# Patient Record
Sex: Male | Born: 1950 | Hispanic: Refuse to answer | Smoking: Former smoker
Health system: Southern US, Community
[De-identification: ages and names within clinical notes are randomized; demographics above are authoritative.]

## PROBLEM LIST (undated history)

## (undated) DIAGNOSIS — M6281 Muscle weakness (generalized): Secondary | ICD-10-CM

## (undated) DIAGNOSIS — M009 Pyogenic arthritis, unspecified: Secondary | ICD-10-CM

## (undated) DIAGNOSIS — F1910 Other psychoactive substance abuse, uncomplicated: Secondary | ICD-10-CM

## (undated) DIAGNOSIS — G9340 Encephalopathy, unspecified: Secondary | ICD-10-CM

## (undated) DIAGNOSIS — L97919 Non-pressure chronic ulcer of unspecified part of right lower leg with unspecified severity: Secondary | ICD-10-CM

## (undated) DIAGNOSIS — A411 Sepsis due to other specified staphylococcus: Secondary | ICD-10-CM

## (undated) DIAGNOSIS — R7881 Bacteremia: Secondary | ICD-10-CM

## (undated) DIAGNOSIS — S2232XA Fracture of one rib, left side, initial encounter for closed fracture: Secondary | ICD-10-CM

## (undated) DIAGNOSIS — L97929 Non-pressure chronic ulcer of unspecified part of left lower leg with unspecified severity: Secondary | ICD-10-CM

## (undated) DIAGNOSIS — F1010 Alcohol abuse, uncomplicated: Secondary | ICD-10-CM

## (undated) DIAGNOSIS — A498 Other bacterial infections of unspecified site: Secondary | ICD-10-CM

## (undated) DIAGNOSIS — IMO0002 Reserved for concepts with insufficient information to code with codable children: Secondary | ICD-10-CM

## (undated) DIAGNOSIS — K089 Disorder of teeth and supporting structures, unspecified: Secondary | ICD-10-CM

## (undated) DIAGNOSIS — N289 Disorder of kidney and ureter, unspecified: Secondary | ICD-10-CM

## (undated) DIAGNOSIS — R4182 Altered mental status, unspecified: Secondary | ICD-10-CM

## (undated) DIAGNOSIS — D649 Anemia, unspecified: Secondary | ICD-10-CM

---

## 2004-04-14 ENCOUNTER — Emergency Department (HOSPITAL_COMMUNITY): Admission: EM | Admit: 2004-04-14 | Discharge: 2004-04-14 | Payer: Self-pay | Admitting: Emergency Medicine

## 2004-04-29 ENCOUNTER — Emergency Department (HOSPITAL_COMMUNITY): Admission: EM | Admit: 2004-04-29 | Discharge: 2004-04-29 | Payer: Self-pay | Admitting: Emergency Medicine

## 2005-01-08 ENCOUNTER — Emergency Department (HOSPITAL_COMMUNITY): Admission: EM | Admit: 2005-01-08 | Discharge: 2005-01-08 | Payer: Self-pay | Admitting: Family Medicine

## 2009-06-17 ENCOUNTER — Emergency Department (HOSPITAL_BASED_OUTPATIENT_CLINIC_OR_DEPARTMENT_OTHER): Admission: EM | Admit: 2009-06-17 | Discharge: 2009-06-17 | Payer: Self-pay | Admitting: Emergency Medicine

## 2009-06-20 ENCOUNTER — Emergency Department (HOSPITAL_COMMUNITY): Admission: EM | Admit: 2009-06-20 | Discharge: 2009-06-20 | Payer: Self-pay | Admitting: Emergency Medicine

## 2009-10-16 ENCOUNTER — Emergency Department (HOSPITAL_COMMUNITY): Admission: EM | Admit: 2009-10-16 | Discharge: 2009-10-16 | Payer: Self-pay | Admitting: Emergency Medicine

## 2010-07-10 LAB — COMPREHENSIVE METABOLIC PANEL
Alkaline Phosphatase: 88 U/L (ref 39–117)
BUN: 13 mg/dL (ref 6–23)
Calcium: 8.9 mg/dL (ref 8.4–10.5)
Creatinine, Ser: 1 mg/dL (ref 0.4–1.5)
Glucose, Bld: 76 mg/dL (ref 70–99)
Total Protein: 7.5 g/dL (ref 6.0–8.3)

## 2010-07-10 LAB — URINALYSIS, ROUTINE W REFLEX MICROSCOPIC
Hgb urine dipstick: NEGATIVE
Specific Gravity, Urine: 1.022 (ref 1.005–1.030)
Urobilinogen, UA: 0.2 mg/dL (ref 0.0–1.0)

## 2010-07-10 LAB — DIFFERENTIAL
Basophils Relative: 2 % — ABNORMAL HIGH (ref 0–1)
Eosinophils Absolute: 0.2 10*3/uL (ref 0.0–0.7)
Eosinophils Relative: 4 % (ref 0–5)
Lymphocytes Relative: 29 % (ref 12–46)
Lymphs Abs: 1.4 10*3/uL (ref 0.7–4.0)
Lymphs Abs: 1.8 10*3/uL (ref 0.7–4.0)
Monocytes Absolute: 0.4 10*3/uL (ref 0.1–1.0)
Monocytes Relative: 8 % (ref 3–12)
Monocytes Relative: 8 % (ref 3–12)
Neutro Abs: 2.6 10*3/uL (ref 1.7–7.7)
Neutrophils Relative %: 58 % (ref 43–77)

## 2010-07-10 LAB — CBC
HCT: 42.8 % (ref 39.0–52.0)
HCT: 44.5 % (ref 39.0–52.0)
Hemoglobin: 14.5 g/dL (ref 13.0–17.0)
MCHC: 33.9 g/dL (ref 30.0–36.0)
MCV: 89.6 fL (ref 78.0–100.0)
MCV: 90.2 fL (ref 78.0–100.0)
RBC: 4.94 MIL/uL (ref 4.22–5.81)
RDW: 13.6 % (ref 11.5–15.5)
WBC: 5.1 10*3/uL (ref 4.0–10.5)

## 2010-07-10 LAB — POCT TOXICOLOGY PANEL

## 2010-07-10 LAB — BASIC METABOLIC PANEL
CO2: 27 mEq/L (ref 19–32)
Chloride: 104 mEq/L (ref 96–112)
GFR calc Af Amer: >60 mL/min (ref >60)
Sodium: 138 mEq/L (ref 135–145)

## 2014-10-26 ENCOUNTER — Inpatient Hospital Stay (HOSPITAL_COMMUNITY): Payer: Medicare Other | Admitting: Certified Registered"

## 2014-10-26 ENCOUNTER — Encounter (HOSPITAL_COMMUNITY)
Admission: EM | Disposition: A | Discharge status: Skilled Nursing Facility | Payer: Self-pay | Source: Home / Self Care | Attending: Family Medicine

## 2014-10-26 ENCOUNTER — Encounter (HOSPITAL_COMMUNITY): Payer: Self-pay | Admitting: *Deleted

## 2014-10-26 ENCOUNTER — Emergency Department (HOSPITAL_COMMUNITY): Payer: Medicare Other

## 2014-10-26 ENCOUNTER — Inpatient Hospital Stay (HOSPITAL_COMMUNITY)
Admission: EM | Admit: 2014-10-26 | Discharge: 2014-11-05 | DRG: 579 | Disposition: A | Discharge status: Skilled Nursing Facility | Payer: Medicare Other | Attending: Family Medicine | Admitting: Family Medicine

## 2014-10-26 DIAGNOSIS — T1490 Injury, unspecified: Secondary | ICD-10-CM

## 2014-10-26 DIAGNOSIS — S71101D Unspecified open wound, right thigh, subsequent encounter: Secondary | ICD-10-CM | POA: Diagnosis not present

## 2014-10-26 DIAGNOSIS — K088 Other specified disorders of teeth and supporting structures: Secondary | ICD-10-CM | POA: Diagnosis present

## 2014-10-26 DIAGNOSIS — M79652 Pain in left thigh: Secondary | ICD-10-CM | POA: Diagnosis present

## 2014-10-26 DIAGNOSIS — I340 Nonrheumatic mitral (valve) insufficiency: Secondary | ICD-10-CM | POA: Diagnosis not present

## 2014-10-26 DIAGNOSIS — E46 Unspecified protein-calorie malnutrition: Secondary | ICD-10-CM | POA: Diagnosis present

## 2014-10-26 DIAGNOSIS — G9389 Other specified disorders of brain: Secondary | ICD-10-CM | POA: Diagnosis present

## 2014-10-26 DIAGNOSIS — L03116 Cellulitis of left lower limb: Secondary | ICD-10-CM | POA: Diagnosis present

## 2014-10-26 DIAGNOSIS — N289 Disorder of kidney and ureter, unspecified: Secondary | ICD-10-CM | POA: Diagnosis not present

## 2014-10-26 DIAGNOSIS — F1010 Alcohol abuse, uncomplicated: Secondary | ICD-10-CM | POA: Diagnosis present

## 2014-10-26 DIAGNOSIS — IMO0002 Reserved for concepts with insufficient information to code with codable children: Secondary | ICD-10-CM

## 2014-10-26 DIAGNOSIS — B965 Pseudomonas (aeruginosa) (mallei) (pseudomallei) as the cause of diseases classified elsewhere: Secondary | ICD-10-CM | POA: Diagnosis not present

## 2014-10-26 DIAGNOSIS — T07XXXA Unspecified multiple injuries, initial encounter: Secondary | ICD-10-CM

## 2014-10-26 DIAGNOSIS — D649 Anemia, unspecified: Secondary | ICD-10-CM | POA: Diagnosis present

## 2014-10-26 DIAGNOSIS — IMO0001 Reserved for inherently not codable concepts without codable children: Secondary | ICD-10-CM

## 2014-10-26 DIAGNOSIS — M009 Pyogenic arthritis, unspecified: Secondary | ICD-10-CM | POA: Diagnosis present

## 2014-10-26 DIAGNOSIS — M00062 Staphylococcal arthritis, left knee: Secondary | ICD-10-CM | POA: Diagnosis not present

## 2014-10-26 DIAGNOSIS — S71102D Unspecified open wound, left thigh, subsequent encounter: Secondary | ICD-10-CM | POA: Diagnosis not present

## 2014-10-26 DIAGNOSIS — R410 Disorientation, unspecified: Secondary | ICD-10-CM | POA: Diagnosis not present

## 2014-10-26 DIAGNOSIS — B871 Wound myiasis: Secondary | ICD-10-CM | POA: Diagnosis present

## 2014-10-26 DIAGNOSIS — S2232XA Fracture of one rib, left side, initial encounter for closed fracture: Secondary | ICD-10-CM | POA: Diagnosis present

## 2014-10-26 DIAGNOSIS — R7881 Bacteremia: Secondary | ICD-10-CM | POA: Diagnosis present

## 2014-10-26 DIAGNOSIS — L97113 Non-pressure chronic ulcer of right thigh with necrosis of muscle: Secondary | ICD-10-CM | POA: Diagnosis present

## 2014-10-26 DIAGNOSIS — T814XXA Infection following a procedure, initial encounter: Secondary | ICD-10-CM

## 2014-10-26 DIAGNOSIS — G9340 Encephalopathy, unspecified: Secondary | ICD-10-CM | POA: Diagnosis present

## 2014-10-26 DIAGNOSIS — D72829 Elevated white blood cell count, unspecified: Secondary | ICD-10-CM | POA: Diagnosis present

## 2014-10-26 DIAGNOSIS — K089 Disorder of teeth and supporting structures, unspecified: Secondary | ICD-10-CM | POA: Diagnosis present

## 2014-10-26 DIAGNOSIS — W19XXXA Unspecified fall, initial encounter: Secondary | ICD-10-CM | POA: Diagnosis present

## 2014-10-26 DIAGNOSIS — F17210 Nicotine dependence, cigarettes, uncomplicated: Secondary | ICD-10-CM | POA: Diagnosis present

## 2014-10-26 DIAGNOSIS — L03115 Cellulitis of right lower limb: Secondary | ICD-10-CM | POA: Diagnosis present

## 2014-10-26 DIAGNOSIS — B9561 Methicillin susceptible Staphylococcus aureus infection as the cause of diseases classified elsewhere: Secondary | ICD-10-CM | POA: Diagnosis present

## 2014-10-26 DIAGNOSIS — L97123 Non-pressure chronic ulcer of left thigh with necrosis of muscle: Secondary | ICD-10-CM | POA: Diagnosis present

## 2014-10-26 DIAGNOSIS — Z590 Homelessness: Secondary | ICD-10-CM

## 2014-10-26 DIAGNOSIS — M00061 Staphylococcal arthritis, right knee: Secondary | ICD-10-CM | POA: Diagnosis not present

## 2014-10-26 DIAGNOSIS — B879 Myiasis, unspecified: Secondary | ICD-10-CM | POA: Diagnosis present

## 2014-10-26 DIAGNOSIS — D62 Acute posthemorrhagic anemia: Secondary | ICD-10-CM | POA: Diagnosis not present

## 2014-10-26 DIAGNOSIS — S81001D Unspecified open wound, right knee, subsequent encounter: Secondary | ICD-10-CM | POA: Diagnosis not present

## 2014-10-26 DIAGNOSIS — R4182 Altered mental status, unspecified: Secondary | ICD-10-CM | POA: Diagnosis present

## 2014-10-26 DIAGNOSIS — L0889 Other specified local infections of the skin and subcutaneous tissue: Secondary | ICD-10-CM | POA: Diagnosis not present

## 2014-10-26 DIAGNOSIS — T814XXD Infection following a procedure, subsequent encounter: Secondary | ICD-10-CM

## 2014-10-26 DIAGNOSIS — A498 Other bacterial infections of unspecified site: Secondary | ICD-10-CM | POA: Diagnosis present

## 2014-10-26 DIAGNOSIS — F1910 Other psychoactive substance abuse, uncomplicated: Secondary | ICD-10-CM | POA: Diagnosis present

## 2014-10-26 DIAGNOSIS — S81002D Unspecified open wound, left knee, subsequent encounter: Secondary | ICD-10-CM | POA: Diagnosis not present

## 2014-10-26 HISTORY — PX: INFECTED SKIN DEBRIDEMENT: SHX678

## 2014-10-26 HISTORY — PX: I&D EXTREMITY: SHX5045

## 2014-10-26 HISTORY — DX: Alcohol abuse, uncomplicated: F10.10

## 2014-10-26 LAB — CBC WITH DIFFERENTIAL/PLATELET
Basophils Absolute: 0 10*3/uL (ref 0.0–0.1)
Basophils Relative: 0 % (ref 0–1)
Eosinophils Absolute: 0.1 10*3/uL (ref 0.0–0.7)
Eosinophils Relative: 1 % (ref 0–5)
HCT: 42.2 % (ref 39.0–52.0)
Hemoglobin: 14.6 g/dL (ref 13.0–17.0)
Lymphocytes Relative: 10 % — ABNORMAL LOW (ref 12–46)
Lymphs Abs: 1.6 10*3/uL (ref 0.7–4.0)
MCH: 30.5 pg (ref 26.0–34.0)
MCHC: 34.6 g/dL (ref 30.0–36.0)
MCV: 88.3 fL (ref 78.0–100.0)
Monocytes Absolute: 1.5 10*3/uL — ABNORMAL HIGH (ref 0.1–1.0)
Monocytes Relative: 10 % (ref 3–12)
Neutro Abs: 12.9 10*3/uL — ABNORMAL HIGH (ref 1.7–7.7)
Neutrophils Relative %: 79 % — ABNORMAL HIGH (ref 43–77)
Platelets: 382 10*3/uL (ref 150–400)
RBC: 4.78 MIL/uL (ref 4.22–5.81)
RDW: 14.1 % (ref 11.5–15.5)
WBC: 16.2 10*3/uL — ABNORMAL HIGH (ref 4.0–10.5)

## 2014-10-26 LAB — URINALYSIS, ROUTINE W REFLEX MICROSCOPIC
GLUCOSE, UA: NEGATIVE mg/dL
HGB URINE DIPSTICK: NEGATIVE
KETONES UR: 15 mg/dL — AB
LEUKOCYTES UA: NEGATIVE
NITRITE: NEGATIVE
PH: 5 (ref 5.0–8.0)
Protein, ur: NEGATIVE mg/dL
SPECIFIC GRAVITY, URINE: 1.021 (ref 1.005–1.030)
UROBILINOGEN UA: 1 mg/dL (ref 0.0–1.0)

## 2014-10-26 LAB — COMPREHENSIVE METABOLIC PANEL
ALT: 25 U/L (ref 17–63)
AST: 49 U/L — ABNORMAL HIGH (ref 15–41)
Albumin: 2.9 g/dL — ABNORMAL LOW (ref 3.5–5.0)
Alkaline Phosphatase: 55 U/L (ref 38–126)
Anion gap: 15 (ref 5–15)
BUN: 24 mg/dL — ABNORMAL HIGH (ref 6–20)
CO2: 26 mmol/L (ref 22–32)
Calcium: 8.8 mg/dL — ABNORMAL LOW (ref 8.9–10.3)
Chloride: 92 mmol/L — ABNORMAL LOW (ref 101–111)
Creatinine, Ser: 1.44 mg/dL — ABNORMAL HIGH (ref 0.61–1.24)
GFR calc Af Amer: 58 mL/min — ABNORMAL LOW (ref >60)
GFR calc non Af Amer: 50 mL/min — ABNORMAL LOW (ref >60)
Glucose, Bld: 104 mg/dL — ABNORMAL HIGH (ref 65–99)
Potassium: 4.8 mmol/L (ref 3.5–5.1)
Sodium: 133 mmol/L — ABNORMAL LOW (ref 135–145)
Total Bilirubin: 2.6 mg/dL — ABNORMAL HIGH (ref 0.3–1.2)
Total Protein: 8.1 g/dL (ref 6.5–8.1)

## 2014-10-26 LAB — RAPID URINE DRUG SCREEN, HOSP PERFORMED
Amphetamines: NOT DETECTED
BENZODIAZEPINES: POSITIVE — AB
Barbiturates: NOT DETECTED
COCAINE: NOT DETECTED
Opiates: POSITIVE — AB
Tetrahydrocannabinol: NOT DETECTED

## 2014-10-26 LAB — PROTIME-INR
INR: 1.16 (ref 0.00–1.49)
Prothrombin Time: 15 seconds (ref 11.6–15.2)

## 2014-10-26 LAB — LACTIC ACID, PLASMA
Lactic Acid, Venous: 2.7 mmol/L (ref 0.5–2.0)
Lactic Acid, Venous: 1.4 mmol/L (ref 0.5–2.0)

## 2014-10-26 LAB — ETHANOL: Alcohol, Ethyl (B): <5 mg/dL (ref <5)

## 2014-10-26 SURGERY — IRRIGATION AND DEBRIDEMENT EXTREMITY
Anesthesia: General | Site: Knee | Laterality: Bilateral

## 2014-10-26 MED ORDER — HEPARIN SODIUM (PORCINE) 5000 UNIT/ML IJ SOLN
5000.0000 [IU] | Freq: Three times a day (TID) | INTRAMUSCULAR | Status: AC
  Administered 2014-10-26 – 2014-10-28 (×5): 5000 [IU] via SUBCUTANEOUS
  Filled 2014-10-26 (×9): qty 1

## 2014-10-26 MED ORDER — SODIUM CHLORIDE 0.9 % IR SOLN
Status: DC | PRN
  Administered 2014-10-26: 500 mL

## 2014-10-26 MED ORDER — ONDANSETRON HCL 4 MG/2ML IJ SOLN
INTRAMUSCULAR | Status: DC | PRN
  Administered 2014-10-26: 4 mg via INTRAVENOUS

## 2014-10-26 MED ORDER — OXYCODONE HCL 5 MG/5ML PO SOLN: 5.0000 mg | Freq: Once | ORAL | Status: DC | PRN

## 2014-10-26 MED ORDER — LACTATED RINGERS IV SOLN
INTRAVENOUS | Status: DC | PRN
  Administered 2014-10-26 (×2): via INTRAVENOUS

## 2014-10-26 MED ORDER — THIAMINE HCL 100 MG/ML IJ SOLN
100.0000 mg | Freq: Every day | INTRAMUSCULAR | Status: DC
  Filled 2014-10-26 (×5): qty 1

## 2014-10-26 MED ORDER — LORAZEPAM 2 MG/ML IJ SOLN
1.0000 mg | Freq: Four times a day (QID) | INTRAMUSCULAR | Status: AC | PRN
  Administered 2014-10-27 – 2014-10-28 (×3): 1 mg via INTRAVENOUS
  Filled 2014-10-26 (×3): qty 1

## 2014-10-26 MED ORDER — ADULT MULTIVITAMIN W/MINERALS CH
1.0000 | ORAL_TABLET | Freq: Every day | ORAL | Status: DC
  Administered 2014-10-27 – 2014-11-05 (×8): 1 via ORAL
  Filled 2014-10-26 (×10): qty 1

## 2014-10-26 MED ORDER — VITAMIN B-1 100 MG PO TABS
100.0000 mg | ORAL_TABLET | Freq: Every day | ORAL | Status: DC
  Administered 2014-10-27 – 2014-11-05 (×8): 100 mg via ORAL
  Filled 2014-10-26 (×10): qty 1

## 2014-10-26 MED ORDER — 0.9 % SODIUM CHLORIDE (POUR BTL) OPTIME
TOPICAL | Status: DC | PRN
  Administered 2014-10-26: 1000 mL

## 2014-10-26 MED ORDER — VANCOMYCIN HCL IN DEXTROSE 1-5 GM/200ML-% IV SOLN
1000.0000 mg | INTRAVENOUS | Status: DC
  Administered 2014-10-27 – 2014-10-28 (×2): 1000 mg via INTRAVENOUS
  Filled 2014-10-26 (×3): qty 200

## 2014-10-26 MED ORDER — PROPOFOL 10 MG/ML IV BOLUS
INTRAVENOUS | Status: DC | PRN
  Administered 2014-10-26: 30 mg via INTRAVENOUS
  Administered 2014-10-26: 170 mg via INTRAVENOUS

## 2014-10-26 MED ORDER — POLYETHYLENE GLYCOL 3350 17 G PO PACK
17.0000 g | PACK | Freq: Every day | ORAL | Status: DC | PRN
  Administered 2014-11-02: 17 g via ORAL
  Filled 2014-10-26 (×2): qty 1

## 2014-10-26 MED ORDER — SODIUM CHLORIDE 0.9 % IV SOLN
INTRAVENOUS | Status: DC
  Administered 2014-10-26 – 2014-10-30 (×4): via INTRAVENOUS

## 2014-10-26 MED ORDER — HYDROMORPHONE HCL 1 MG/ML IJ SOLN: 0.2500 mg | INTRAMUSCULAR | Status: DC | PRN

## 2014-10-26 MED ORDER — DIAZEPAM 5 MG/ML IJ SOLN
7.5000 mg | Freq: Once | INTRAMUSCULAR | Status: AC
  Administered 2014-10-26: 7.5 mg via INTRAVENOUS
  Filled 2014-10-26: qty 2

## 2014-10-26 MED ORDER — MORPHINE SULFATE 4 MG/ML IJ SOLN
6.0000 mg | Freq: Once | INTRAMUSCULAR | Status: AC
  Administered 2014-10-26: 6 mg via INTRAVENOUS
  Filled 2014-10-26: qty 2

## 2014-10-26 MED ORDER — PIPERACILLIN-TAZOBACTAM 3.375 G IVPB 30 MIN
3.3750 g | Freq: Once | INTRAVENOUS | Status: AC
  Administered 2014-10-26: 3.375 g via INTRAVENOUS
  Filled 2014-10-26: qty 50

## 2014-10-26 MED ORDER — SODIUM CHLORIDE 0.9 % IV BOLUS (SEPSIS)
1000.0000 mL | Freq: Once | INTRAVENOUS | Status: AC
  Administered 2014-10-26: 1000 mL via INTRAVENOUS

## 2014-10-26 MED ORDER — FENTANYL CITRATE (PF) 100 MCG/2ML IJ SOLN
INTRAMUSCULAR | Status: DC | PRN
  Administered 2014-10-26 (×2): 100 ug via INTRAVENOUS
  Administered 2014-10-26 (×3): 50 ug via INTRAVENOUS

## 2014-10-26 MED ORDER — ONDANSETRON HCL 4 MG/2ML IJ SOLN: 4.0000 mg | Freq: Once | INTRAMUSCULAR | Status: DC | PRN

## 2014-10-26 MED ORDER — EPHEDRINE SULFATE 50 MG/ML IJ SOLN
INTRAMUSCULAR | Status: DC | PRN
  Administered 2014-10-26: 10 mg via INTRAVENOUS

## 2014-10-26 MED ORDER — MIDAZOLAM HCL 2 MG/2ML IJ SOLN
INTRAMUSCULAR | Status: AC
  Filled 2014-10-26: qty 2

## 2014-10-26 MED ORDER — OXYCODONE HCL 5 MG PO TABS: 5.0000 mg | ORAL_TABLET | Freq: Once | ORAL | Status: DC | PRN

## 2014-10-26 MED ORDER — VANCOMYCIN HCL 10 G IV SOLR
1250.0000 mg | Freq: Once | INTRAVENOUS | Status: AC
  Administered 2014-10-26: 1250 mg via INTRAVENOUS
  Filled 2014-10-26: qty 1250

## 2014-10-26 MED ORDER — MIDAZOLAM HCL 5 MG/5ML IJ SOLN
INTRAMUSCULAR | Status: DC | PRN
  Administered 2014-10-26: 2 mg via INTRAVENOUS

## 2014-10-26 MED ORDER — FENTANYL CITRATE (PF) 250 MCG/5ML IJ SOLN
INTRAMUSCULAR | Status: AC
  Filled 2014-10-26: qty 5

## 2014-10-26 MED ORDER — LORAZEPAM 1 MG PO TABS
1.0000 mg | ORAL_TABLET | Freq: Four times a day (QID) | ORAL | Status: AC | PRN
  Administered 2014-10-27: 1 mg via ORAL
  Filled 2014-10-26: qty 1

## 2014-10-26 MED ORDER — PIPERACILLIN-TAZOBACTAM 3.375 G IVPB
3.3750 g | Freq: Three times a day (TID) | INTRAVENOUS | Status: DC
  Administered 2014-10-26 – 2014-11-02 (×20): 3.375 g via INTRAVENOUS
  Filled 2014-10-26 (×25): qty 50

## 2014-10-26 MED ORDER — HYDROMORPHONE HCL 1 MG/ML IJ SOLN
1.0000 mg | INTRAMUSCULAR | Status: DC | PRN
Start: 2014-10-26 — End: 2014-10-27
  Administered 2014-10-26 – 2014-10-27 (×2): 1 mg via INTRAVENOUS
  Filled 2014-10-26 (×2): qty 1

## 2014-10-26 MED ORDER — ONDANSETRON HCL 4 MG/2ML IJ SOLN
INTRAMUSCULAR | Status: AC
Start: 2014-10-26 — End: 2014-10-26
  Filled 2014-10-26: qty 2

## 2014-10-26 MED ORDER — FOLIC ACID 1 MG PO TABS
1.0000 mg | ORAL_TABLET | Freq: Every day | ORAL | Status: DC
  Administered 2014-10-27 – 2014-11-05 (×8): 1 mg via ORAL
  Filled 2014-10-26 (×10): qty 1

## 2014-10-26 MED ORDER — SODIUM CHLORIDE 0.9 % IR SOLN
Status: DC | PRN
  Administered 2014-10-26: 3000 mL

## 2014-10-26 MED ORDER — SUCCINYLCHOLINE CHLORIDE 20 MG/ML IJ SOLN
INTRAMUSCULAR | Status: DC | PRN
  Administered 2014-10-26: 100 mg via INTRAVENOUS

## 2014-10-26 MED ORDER — LIDOCAINE HCL (CARDIAC) 20 MG/ML IV SOLN
INTRAVENOUS | Status: DC | PRN
  Administered 2014-10-26: 50 mg via INTRAVENOUS

## 2014-10-26 SURGICAL SUPPLY — 72 items
BAG DECANTER FOR FLEXI CONT (MISCELLANEOUS) ×3 IMPLANT
BANDAGE ELASTIC 3 VELCRO ST LF (GAUZE/BANDAGES/DRESSINGS) IMPLANT
BLADE SURG 10 STRL SS (BLADE) ×3 IMPLANT
BNDG COHESIVE 1X5 TAN STRL LF (GAUZE/BANDAGES/DRESSINGS) IMPLANT
BNDG COHESIVE 4X5 TAN STRL (GAUZE/BANDAGES/DRESSINGS) ×6 IMPLANT
BNDG COHESIVE 6X5 TAN STRL LF (GAUZE/BANDAGES/DRESSINGS) IMPLANT
BNDG CONFORM 3 STRL LF (GAUZE/BANDAGES/DRESSINGS) IMPLANT
BNDG GAUZE STRTCH 6 (GAUZE/BANDAGES/DRESSINGS) ×3 IMPLANT
CONNECTOR Y ATS VAC SYSTEM (MISCELLANEOUS) ×3 IMPLANT
CONT SPEC 4OZ CLIKSEAL STRL BL (MISCELLANEOUS) ×12 IMPLANT
CORDS BIPOLAR (ELECTRODE) IMPLANT
COVER SURGICAL LIGHT HANDLE (MISCELLANEOUS) ×3 IMPLANT
CUFF TOURNIQUET SINGLE 24IN (TOURNIQUET CUFF) IMPLANT
CUFF TOURNIQUET SINGLE 34IN LL (TOURNIQUET CUFF) IMPLANT
CUFF TOURNIQUET SINGLE 44IN (TOURNIQUET CUFF) IMPLANT
DRAPE EXTREMITY BILATERAL (DRAPE) ×3 IMPLANT
DRAPE IMP U-DRAPE 54X76 (DRAPES) IMPLANT
DRAPE INCISE IOBAN 66X45 STRL (DRAPES) ×15 IMPLANT
DRAPE PROXIMA HALF (DRAPES) ×3 IMPLANT
DRAPE SURG 17X23 STRL (DRAPES) IMPLANT
DRAPE U-SHAPE 47X51 STRL (DRAPES) ×3 IMPLANT
DRSG MEPITEL 4X7.2 (GAUZE/BANDAGES/DRESSINGS) ×6 IMPLANT
DRSG VAC ATS LRG SENSATRAC (GAUZE/BANDAGES/DRESSINGS) ×6 IMPLANT
DURAPREP 26ML APPLICATOR (WOUND CARE) ×9 IMPLANT
ELECT CAUTERY BLADE 6.4 (BLADE) ×3 IMPLANT
ELECT REM PT RETURN 9FT ADLT (ELECTROSURGICAL)
ELECTRODE REM PT RTRN 9FT ADLT (ELECTROSURGICAL) IMPLANT
EVACUATOR 1/8 PVC DRAIN (DRAIN) ×6 IMPLANT
FACESHIELD WRAPAROUND (MASK) IMPLANT
GAUZE SPONGE 4X4 12PLY STRL (GAUZE/BANDAGES/DRESSINGS) IMPLANT
GAUZE XEROFORM 1X8 LF (GAUZE/BANDAGES/DRESSINGS) IMPLANT
GAUZE XEROFORM 5X9 LF (GAUZE/BANDAGES/DRESSINGS) IMPLANT
GLOVE NEODERM STRL 7.5 LF PF (GLOVE) ×2 IMPLANT
GLOVE SURG NEODERM 7.5  LF PF (GLOVE) ×4
GOWN STRL REIN XL XLG (GOWN DISPOSABLE) ×6 IMPLANT
HANDPIECE INTERPULSE COAX TIP (DISPOSABLE)
KIT BASIN OR (CUSTOM PROCEDURE TRAY) ×3 IMPLANT
KIT ROOM TURNOVER OR (KITS) ×3 IMPLANT
MANIFOLD NEPTUNE II (INSTRUMENTS) ×3 IMPLANT
NS IRRIG 1000ML POUR BTL (IV SOLUTION) ×6 IMPLANT
PACK GENERAL/GYN (CUSTOM PROCEDURE TRAY) ×3 IMPLANT
PACK ORTHO EXTREMITY (CUSTOM PROCEDURE TRAY) IMPLANT
PAD ABD 8X10 STRL (GAUZE/BANDAGES/DRESSINGS) ×3 IMPLANT
PAD ARMBOARD 7.5X6 YLW CONV (MISCELLANEOUS) ×6 IMPLANT
PAD NEG PRESSURE SENSATRAC (MISCELLANEOUS) ×3 IMPLANT
PADDING CAST ABS 4INX4YD NS (CAST SUPPLIES)
PADDING CAST ABS COTTON 4X4 ST (CAST SUPPLIES) IMPLANT
PADDING CAST COTTON 6X4 STRL (CAST SUPPLIES) IMPLANT
SET HNDPC FAN SPRY TIP SCT (DISPOSABLE) IMPLANT
SPONGE LAP 18X18 X RAY DECT (DISPOSABLE) ×12 IMPLANT
STOCKINETTE IMPERVIOUS 9X36 MD (GAUZE/BANDAGES/DRESSINGS) IMPLANT
STOCKINETTE IMPERVIOUS LG (DRAPES) ×6 IMPLANT
SUT ETHILON 2 0 FS 18 (SUTURE) ×12 IMPLANT
SUT ETHILON 2 0 PSLX (SUTURE) ×9 IMPLANT
SUT ETHILON 3 0 PS 1 (SUTURE) ×6 IMPLANT
SUT MON AB 2-0 CT1 36 (SUTURE) ×12 IMPLANT
SUT PDS AB 1 CT  36 (SUTURE) ×4
SUT PDS AB 1 CT 36 (SUTURE) ×2 IMPLANT
SUT VIC AB 2-0 CT1 36 (SUTURE) ×3 IMPLANT
SUT VIC AB 2-0 FS1 27 (SUTURE) IMPLANT
SWAB COLLECTION DEVICE MRSA (MISCELLANEOUS) ×6 IMPLANT
SYR CONTROL 10ML LL (SYRINGE) IMPLANT
TOWEL OR 17X24 6PK STRL BLUE (TOWEL DISPOSABLE) ×3 IMPLANT
TOWEL OR 17X26 10 PK STRL BLUE (TOWEL DISPOSABLE) ×3 IMPLANT
TUBE ANAEROBIC SPECIMEN COL (MISCELLANEOUS) ×6 IMPLANT
TUBE CONNECTING 12'X1/4 (SUCTIONS) ×1
TUBE CONNECTING 12X1/4 (SUCTIONS) ×2 IMPLANT
TUBE FEEDING 5FR 15 INCH (TUBING) IMPLANT
TUBING CYSTO DISP (UROLOGICAL SUPPLIES) ×3 IMPLANT
UNDERPAD 30X30 INCONTINENT (UNDERPADS AND DIAPERS) ×6 IMPLANT
WATER STERILE IRR 1000ML POUR (IV SOLUTION) IMPLANT
YANKAUER SUCT BULB TIP NO VENT (SUCTIONS) ×3 IMPLANT

## 2014-10-26 NOTE — Progress Notes (Addendum)
ANTIBIOTIC CONSULT NOTE - INITIAL  Pharmacy Consult for Vancomycin Indication: Cellulitis  No Known Allergies  Patient Measurements: Height: 6' (182.9 cm) Weight: 156 lb (70.761 kg) IBW/kg (Calculated) : 77.6  Vital Signs: Temp: 98.9 F (37.2 C) (07/12 1218) BP: 145/92 mmHg (07/12 1215) Pulse Rate: 91 (07/12 1215) Intake/Output from previous day:   Intake/Output from this shift:    Labs: No results for input(s): WBC, HGB, PLT, LABCREA, CREATININE in the last 72 hours. CrCl cannot be calculated (Patient has no serum creatinine result on file.). No results for input(s): VANCOTROUGH, VANCOPEAK, VANCORANDOM, GENTTROUGH, GENTPEAK, GENTRANDOM, TOBRATROUGH, TOBRAPEAK, TOBRARND, AMIKACINPEAK, AMIKACINTROU, AMIKACIN in the last 72 hours.   Microbiology: No results found for this or any previous visit (from the past 720 hour(s)).  Medical History: Past Medical History  Diagnosis Date  . ETOH abuse    Assessment: 64 yo M presents on 7/12 with leg pain. Pt has a necrotic wound to bilateral lower extremities. Patient to OR today for I&D. Pharmacy consulted to dose vancomycin/zosyn for cellulitis. Afebrile, wbc elevated 16.2. SCr elevated at 1.44, CrCl ~2446ml/min. Zosyn x 1 given in the ED.  7/12 zosyn>>  7/12 vanc>>   7/12 BCx2>>   Goal of Therapy:  Vancomycin trough level 10-15 mcg/ml  Resolution of infection  Plan:  Give vancomycin 1250mg  IV x 1, then start vancomycin 1g IV Q24  Zosyn 3.375g x 1 given in ED; Continue zosyn 3.375g IV q8h  Monitor clinical picture, renal function, VT prn  F/U OR I&D for need to continue Zosyn and vancomycin  F/U C&S, abx deescalation / LOT     Babs BertinHaley Moroni Nester, PharmD Clinical Pharmacist - Resident Pager [phone] 10/26/2014 5:19 PM

## 2014-10-26 NOTE — Progress Notes (Signed)
Patient back from surgery, lying in bed, SR up, call bell in reach, IVF infusing to LAC without problems, site clear, O2 in use, SAT 100%, patient alert, oriented to self and time, denies pain at this time. Wound Vac to bilateral knees with bilateral hemovacs-both compressed and draining scant amount serosanginous drainage. No problems noted at this time, will monitor.

## 2014-10-26 NOTE — ED Notes (Signed)
Pt arrives from home via PTAR. Pt states he fell on Friday and has been having knee pain. Upon assessment pt has a large area on the anterior leg bilaterally from his upper thigh to below the knee that appears to be necrotic and has a foul odor. Pt states he doesn't know when this began. Pt keeps asking, "What are you going to do?".

## 2014-10-26 NOTE — ED Notes (Signed)
Surgery at bedside.

## 2014-10-26 NOTE — Consult Note (Signed)
   ORTHOPAEDIC CONSULTATION  REQUESTING PHYSICIAN: Raeford RazorStephen Kohut, MD  Chief Complaint: bilateral leg wounds  HPI: Timothy DubinWarren D Blount is a 64 y.o. male who presents with bilateral leg wounds. Patient has slightly AMS.  History is difficult to obtain.  States he fell Friday.  No other details can be obtained 2/2 AMS.  Ortho consulted.  Past Medical History  Diagnosis Date  . ETOH abuse    History reviewed. No pertinent past surgical history. History   Social History  . Marital Status: Legally Separated    Spouse Name: N/A  . Number of Children: N/A  . Years of Education: N/A   Social History Main Topics  . Smoking status: Current Every Day Smoker -- 0.25 packs/day    Types: Cigarettes  . Smokeless tobacco: Never Used  . Alcohol Use: 1.2 oz/week    2 Cans of beer per week     Comment: Pt states, "I just drink"  . Drug Use: No  . Sexual Activity: Yes   Other Topics Concern  . None   Social History Narrative  . None   History reviewed. No pertinent family history. No Known Allergies Prior to Admission medications   Not on File   No results found.  Positive ROS: All other systems have been reviewed and were otherwise negative with the exception of those mentioned in the HPI and as above.  Physical Exam: General: NAD Cardiovascular: 2+ distal pulses Respiratory: No cyanosis, no use of accessory musculature GI: No organomegaly, abdomen is soft and non-tender Skin: Large necrotic wounds over anterior thighs with maggots and drainage Neurologic: Sensation intact distally Psychiatric: AMS Lymphatic: No axillary or cervical lymphadenopathy  MUSCULOSKELETAL:  - large bilateral thigh necrotic wounds with drainage and maggots - no fluctuance - NVI distally  Assessment: Bilateral thigh necrotic wounds, eschar, maggots  Plan: - abx per primary team, admit to hospitalist - will take to OR today for I&D - keep NPO  Thank you for the consult and the opportunity to see  Mr. Theone StanleyEnglish  N. Michael Xu, MD [facility] [phone] 1:02 PM

## 2014-10-26 NOTE — H&P (Signed)
Family Medicine Teaching [facility] Admission History and Physical Service Pager: [phone]  Patient name: Timothy Norman Medical record number: [id number] Date of birth: [birth year] Age: 64 y.o. Gender: male  Primary Care Provider: No primary care provider on file. Consultants: Orthopedic surgery Code Status: unable to obtain on admission, full code for now and readdress when more alert  Chief Complaint: pain in legs  Assessment and Plan: Timothy Norman is a 64 y.o. male presenting with pain in his bilateral legs following a fall . PMH is significant for alcohol abuse.   Cellulitis with multiple necrotic wounds; Patient claims that the wounds he has on bilateral anterior legs happened when he fell on Friday but the appearance of the wounds look like it has been infected for months. There are multiple areas of necrotic tissue infested with maggots. Many of the maggots were removed in the ED. Most likely cellulitis based on the appearance. Possible gangrene. WBC 16.2 on admission, no signs of fever and vitals have been stable. Possible osteomyelitis depending on the depth of the wounds over the knees, X-Rays did not reveal osteo.  -Orthopedic surgery debriding wounds on 7/12 -Continue Vanc (Day 1) -Continue Zosyn (Day 1) -Monitor for fever -Monitor WBC, currently 16.2 -appreciate orthopedic assistance  AMS; Initially suspected alcohol intoxication but it alcohol level was below detectable range. Possible other illicit substance intoxication. Due to history of alcohol abuse, could be due to elevated ammonia level secondary to cirrhosis. LFTs fairly normal and normal INR but mild hyperbili and hypoalbuminemia. Glucose levels only mildly elevated, most likely not hypoglycemic episode. Psychiatric history unknown, possible schizophrenia. Although WBC 16.2, most likely not sepsis based on normal vital signs.  -UDS positive for benzos and opiates but was given both in ED -Check ammonia  level -Monitor mental status  Alcohol Abuse; Chronic -consult social work -CIWA protocol  Homelessness -Social work consulted  FEN/GI: regular diet, NPO for possible return to OR 7/14 Prophylaxis: none  Disposition: admit to floor, possible SNF upon discharge  History of Present Illness: Timothy Norman is a 64 y.o. male presenting with bilateral leg pain secondary to a fall. History is difficult to obtain due to patient's AMS. Patient states he fell on Friday when he was running home. He fell on pavement and hurt knees and says "they were bloody". He cannot remember anymore details of the event at this time.  He arrived to the ED from home via PTAR.  During the interview, patient kept repeatedly crying and yelling "I don't wanna be here!" and "What're you doing to my legs?". Additionally he was asking "Where's my cousin?" I could not obtain an adequate ROS due to his AMS. He did admit to some diarrhea but could not answer any follow up questions.    Review Of Systems: Per HPI with the following additions: no depression/anxiety Otherwise 12 point review of systems was performed and was unremarkable.  Patient Active Problem List   Diagnosis Date Noted  . Wounds, multiple 10/26/2014   Past Medical History: Past Medical History  Diagnosis Date  . ETOH abuse    Past Surgical History: History reviewed. No pertinent past surgical history. Social History: History  Substance Use Topics  . Smoking status: Current Every Day Smoker -- 0.25 packs/day    Types: Cigarettes  . Smokeless tobacco: Never Used  . Alcohol Use: 1.2 oz/week    2 Cans of beer per week     Comment: Pt states, "I just drink"   Please also  refer to relevant sections of EMR.  Family History: History reviewed. No pertinent family history. Allergies and Medications: No Known Allergies No current facility-administered medications on file prior to encounter.   No current outpatient prescriptions on file prior  to encounter.    Objective: BP 108/66 mmHg  Pulse 88  Temp(Src) 98.9 F (37.2 C)  Resp 16  Ht 6' (1.829 m)  Wt 156 lb (70.761 kg)  BMI 21.15 kg/m2  SpO2 99% Exam: General: In NAD, laying in bed crying and yelling. AMS Eyes: no scleral icterus, EOM intact, pupils equal and reactive to light, wearing glasses ENTM: poor dentition, dry mucous membranes Neck: supple Cardiovascular: RRR, normal s1 and s2,. No murmurs. +2 dorsalis pedis pulses bilaterally  Respiratory: clear to auscultation bilaterally, no increased WOB Abdomen: (+) BS, nontender, nondistended, soft MSK: Normal passive range of motion Skin: Large necrotic wounds in bilateral legs from anterior thighs to knees. Areas look more necrotic and maggot filled with purulent drainage. No fluctuance. Neuro: alert, oriented to self month and year only, sensation intact distally, no gross deficits Psych: AMS, poor insight, tearful affect and   Labs and Imaging: CBC BMET   Recent Labs Lab 10/26/14 1230  WBC 16.2*  HGB 14.6  HCT 42.2  PLT 382    Recent Labs Lab 10/26/14 1230  NA 133*  K 4.8  CL 92*  CO2 26  BUN 24*  CREATININE 1.44*  GLUCOSE 104*  CALCIUM 8.8*    AST 49 ALT 25 Albumin 2.9 Tbili 2.6 LA 2.7 -> 1.4 INR 1.16 Multiple wound cultures collected intraop pending Blood cultures pending  Dg Chest 1 View  10/26/2014   CLINICAL DATA:  Multiple falls.  EXAM: CHEST  1 VIEW  COMPARISON:  April 29, 2004.  FINDINGS: The heart size and mediastinal contours are within normal limits. Both lungs are clear. No pneumothorax or pleural effusion is noted. Possible acute minimally displaced fracture involving the left seventh rib. Old left rib fractures are noted.  IMPRESSION: Possible acute minimally displaced fracture involving anterior portion of left seventh rib. No acute pulmonary disease is noted.   Electronically Signed   By: Lupita RaiderJames  Green Jr, M.D.   On: 10/26/2014 14:18   Dg Knee Complete 4 Views  Left  10/26/2014   CLINICAL DATA:  Left knee pain for 4 days with recent falls. Initial encounter.  EXAM: LEFT KNEE - COMPLETE 4+ VIEW  COMPARISON:  None.  FINDINGS: There is no evidence of fracture, dislocation, or joint effusion. There is no evidence of arthropathy or other focal bone abnormality. Large soft tissue laceration or defect is seen anterior to the patella. No radiopaque foreign body is noted.  IMPRESSION: No fracture or dislocation is noted. Large soft tissue laceration seen anterior to the patella.   Electronically Signed   By: Lupita RaiderJames  Green Jr, M.D.   On: 10/26/2014 14:13   Dg Knee Complete 4 Views Right  10/26/2014   CLINICAL DATA:  Bilateral anterior knee pain, recent falls, and soft tissue swelling  EXAM: RIGHT KNEE - COMPLETE 4+ VIEW  COMPARISON:  10/26/2014  FINDINGS: Soft tissue swelling and irregularity of the distal thigh and over the anterior knee on the cross-table lateral view. No radiopaque foreign body. No acute osseous finding, malalignment, or effusion. Peripheral atherosclerosis noted.  IMPRESSION: Anterior right distal thigh and knee region soft tissue irregularity and swelling suspicious for ongoing soft tissue infection.  No acute osseous finding, joint abnormality or effusion.   Electronically Signed   By:  M.  Shick M.D.   On: 10/26/2014 14:12   Dg Femur Min 2 Views Left  10/26/2014   CLINICAL DATA:  64 year old male with bilateral thigh and knee pain for the past 4 days. Deep wound anterior lower thigh and knee region. Recent falls. Alcohol abuse. Initial encounter.  EXAM: LEFT FEMUR 2 VIEWS  COMPARISON:  None.  FINDINGS: No fracture or dislocation.  Soft tissue wound anterior aspect of the lower left thigh. No plain film evidence of adjacent osteomyelitis.  Wound/soft tissue defect anterior aspect of the patella. Patella may be visible clinically. No plain film evidence of osteomyelitis.  IMPRESSION: No fracture or dislocation.  Soft tissue wound anterior aspect of the  lower left thigh. No plain film evidence of adjacent osteomyelitis.  Wound/soft tissue defect anterior aspect of the patella. Patella may be visible clinically. No plain film evidence of osteomyelitis.   Electronically Signed   By: Lacy Duverney M.D.   On: 10/26/2014 14:13   Dg Femur, Min 2 Views Right  10/26/2014   CLINICAL DATA:  Bilateral thigh pain, soft tissue wounds, recent falls, abscess  EXAM: RIGHT FEMUR 2 VIEWS  COMPARISON:  10/26/2014  FINDINGS: Right femur appears intact without acute osseous finding or fracture. Soft tissue swelling again noted with irregularity of the soft tissues of the distal anterior thigh and knee region. Peripheral atherosclerosis evident. No radiopaque foreign body.  IMPRESSION: Intact right femur.  No acute osseous finding  Anterior distal thigh and knee region soft tissue irregularity and swelling  Peripheral atherosclerosis   Electronically Signed   By: Judie Petit.  Shick M.D.   On: 10/26/2014 14:14     Beaulah Dinning, MD 10/26/2014, 2:27 PM PGY-1, [facility] FPTS Intern pager: [phone], text pages welcome  FPTS Upper-Level Resident Addendum  I have independently interviewed and examined the patient. I have discussed the above with the original author and agree with their documentation. My edits for correction/addition/clarification are in pink. Please see also any attending notes.   Abram Sander, MD MPH PGY-3, [facility] FPTS Service pager: [phone] (text pages welcome through [facility])

## 2014-10-26 NOTE — Anesthesia Preprocedure Evaluation (Addendum)
Anesthesia Evaluation   Patient awake    Reviewed: Allergy & Precautions, NPO status , Patient's Chart, lab work & pertinent test results  Airway Mallampati: II  TM Distance: >3 FB Neck ROM: Full    Dental  (+) Poor Dentition, Dental Advisory Given   Pulmonary Current Smoker,  breath sounds clear to auscultation        Cardiovascular Rhythm:Regular Rate:Normal     Neuro/Psych    GI/Hepatic   Endo/Other    Renal/GU      Musculoskeletal   Abdominal   Peds  Hematology   Anesthesia Other Findings   Reproductive/Obstetrics                            Anesthesia Physical Anesthesia Plan  ASA: III  Anesthesia Plan: General   Post-op Pain Management:    Induction: Intravenous  Airway Management Planned: Oral ETT  Additional Equipment:   Intra-op Plan:   Post-operative Plan: Extubation in OR  Informed Consent: I have reviewed the patients History and Physical, chart, labs and discussed the procedure including the risks, benefits and alternatives for the proposed anesthesia with the patient or authorized representative who has indicated his/her understanding and acceptance.   Dental advisory given  Plan Discussed with: CRNA, Anesthesiologist and Surgeon  Anesthesia Plan Comments:         Anesthesia Quick Evaluation

## 2014-10-26 NOTE — Op Note (Signed)
   Date of surgery: 10/26/2014  Preoperative diagnoses: Bilateral anterior thigh eschar with frank purulence and maggots  Postoperative diagnosis: Same  Procedure: 1. Sharp excisional debridement of left thigh eschar, fascia, muscle and subcutaneous tissue measuring 29 cm x 11 cm 2. Arthrotomy of left knee joint 3. Sharp excisional debridement of right thigh eschar, fascia, muscle and subcutaneous tissue measuring 27 cm x 10 cm 4. Arthrotomy of right knee joint 5. Adjacent tissue rearrangement left thigh 20 cm x 10 cm 6. Adjacent tissue rearrangement right thigh 4 cm x 11 cm 7. Application of negative wound therapy greater than 50 cm  Surgeon: Glee ArvinMichael Christian Treadway, M.D.  Anesthesia: General  Estimated blood loss: 200 mL  Specimens: Tissue and synovial fluid from both knees  Complications: None  Indications for procedure: Mr. Timothy Pondsnglish is a 64 year old gentleman who presented to the ER today with massive bilateral anterior thigh eschars. The history is unknown secondary to patient's altered mental status and poor historian. He had maggots in his wounds. He did have signs and symptoms consistent with sepsis. He was indicated for formal I&D. Consent was obtained.  Description of procedure: The patient was identified in the preoperative holding area. The operative sites were marked by the surgeon. He is brought back to the operating room. He was placed supine on the table. General anesthesia was induced. Bilateral lower extremity were prepped and draped in standard sterile fashion. A timeout was performed. Anti-biotics were given at the regular intervals.  We first began with the left thigh. Sharp excisional debridement of the eschar, subcutaneous tissue, muscle, fascia was performed with a knife and rongeur. He had a moderate amount of purulence. The specimen was sent off for culture. We then performed extensive sharp debridement of the entire wound. There was necrosis of the peritenon around the  patella. He did exhibit an effusion. A medial parapatellar arthrotomy was created to fully I&D the knee joint. There was no frank pus or evidence of infection of the knee. The wound was thoroughly irrigated with 6 L of normal saline using cystoscopy tubing. We then turned our attention to the right thigh and performed the same procedures.  We were able to get partial closure of the right thigh wound by performing adjacent tissue rearrangement. A Hemovac was placed intra-articularly. The arthrotomy was closed with #1 PDS sutures.  The rest of the wound was dressed with a wound VAC. For the left thigh we were able to perform also adjacent tissue rearrangement to close part of the wound. A Hemovac was also placed in the joint. The arthrotomy was closed with #1 PDS sutures.  The rest of the wound was then dressed with a wound VAC. This was turned to -125 mmHg.  The patient tolerated the procedure well was extubated and transferred to the PACU in stable condition. All sponge counts were correct.  Disposition: The patient will need to remain on broad-spectrum anti-biotics until speciation of the bacteria.  He will need to return to the operating room on Thursday or Friday for repeat I&D for residual infection. We plan on letting the wounds granulate in by secondary intention with the wound VAC. He will ultimately require a skin graft.  Timothy ReelN. Timothy Muhammadali Ries, MD [facility] [phone] 7:31 PM

## 2014-10-26 NOTE — Progress Notes (Signed)
MD has been Paged about pt's arrival on unit

## 2014-10-26 NOTE — Progress Notes (Signed)
Attempted to call report to ED, no answer. Secretary to give RN my number for call back.

## 2014-10-26 NOTE — Anesthesia Procedure Notes (Signed)
Procedure Name: Intubation Date/Time: 10/26/2014 5:21 PM Performed by: Charm BargesBUTLER, Doriann Zuch R Pre-anesthesia Checklist: Patient identified, Emergency Drugs available, Suction available, Patient being monitored and Timeout performed Patient Re-evaluated:Patient Re-evaluated prior to inductionOxygen Delivery Method: Circle system utilized Preoxygenation: Pre-oxygenation with 100% oxygen Intubation Type: IV induction Ventilation: Mask ventilation without difficulty Laryngoscope Size: 4 and Mac Grade View: Grade I Tube type: Oral Tube size: 8.0 mm Number of attempts: 1 Airway Equipment and Method: Stylet Placement Confirmation: ETT inserted through vocal cords under direct vision,  positive ETCO2 and breath sounds checked- equal and bilateral Secured at: 22 cm Tube secured with: Tape Dental Injury: Teeth and Oropharynx as per pre-operative assessment

## 2014-10-26 NOTE — Transfer of Care (Signed)
Immediate Anesthesia Transfer of Care Note  Patient: Timothy Norman  Procedure(s) Performed: Procedure(s): IRRIGATION AND DEBRIDEMENT BILATERAL LEGS (Bilateral)  Patient Location: PACU  Anesthesia Type:General  Level of Consciousness: sedated and patient cooperative  Airway & Oxygen Therapy: Patient connected to nasal cannula oxygen  Post-op Assessment: Report given to RN and Post -op Vital signs reviewed and stable  Post vital signs: Reviewed and stable  Last Vitals:  Filed Vitals:   10/26/14 1935  BP: 128/87  Pulse: 104  Temp:   Resp: 12    Complications: No apparent anesthesia complications

## 2014-10-26 NOTE — Anesthesia Postprocedure Evaluation (Signed)
  Anesthesia Post-op Note  Patient: Timothy Norman  Procedure(s) Performed: Procedure(s) (LRB): IRRIGATION AND DEBRIDEMENT BILATERAL LEGS (Bilateral)  Patient Location: PACU  Anesthesia Type: General  Level of Consciousness: awake and alert   Airway and Oxygen Therapy: Patient Spontanous Breathing  Post-op Pain: mild  Post-op Assessment: Post-op Vital signs reviewed, Patient's Cardiovascular Status Stable, Respiratory Function Stable, Patent Airway and No signs of Nausea or vomiting  Last Vitals:  Filed Vitals:   10/26/14 1935  BP: 128/87  Pulse: 104  Temp: 36.8 C  Resp: 12    Post-op Vital Signs: stable   Complications: No apparent anesthesia complications

## 2014-10-26 NOTE — Progress Notes (Signed)
Pt arrived to floor, wounds assessed and documented. OR transport arrived to floor to take pt back to OR.

## 2014-10-26 NOTE — ED Provider Notes (Signed)
CSN: [id number]     Arrival date & time 10/26/14  1145 History   First MD Initiated Contact with Patient 10/26/14 1147     Chief Complaint  Patient presents with  . Leg Pain     (Consider location/radiation/quality/duration/timing/severity/associated sxs/prior Treatment) HPI   64 year old male with bilateral leg pain. Patient is coming from home. Recent falls. He is an alcoholic. He is currently confused. He says he fell on Friday although his wounds are obviously much older than this. Is complaining of pain in both of his legs and seems very uncomfortable. His wounds are infected and have maggots in am. Further history is unavailable.  Past Medical History  Diagnosis Date  . ETOH abuse    History reviewed. No pertinent past surgical history. History reviewed. No pertinent family history. History  Substance Use Topics  . Smoking status: Current Every Day Smoker -- 0.25 packs/day    Types: Cigarettes  . Smokeless tobacco: Never Used  . Alcohol Use: 1.2 oz/week    2 Cans of beer per week     Comment: Pt states, "I just drink"    Review of Systems  All systems reviewed and negative, other than as noted in HPI.   Allergies  Review of patient's allergies indicates no known allergies.  Home Medications   Prior to Admission medications   Not on File   Ht 6' (1.829 m)  Wt 156 lb (70.761 kg)  BMI 21.15 kg/m2  SpO2 98% Physical Exam  Constitutional:  Chronic ill appearing. Disheveled. Repetitively saying 'It hurts. My legs. It hurts." Sometimes saying "Friday."   HENT:  Head: Normocephalic and atraumatic.  Eyes: Conjunctivae are normal. Right eye exhibits no discharge. Left eye exhibits no discharge.  Neck: Neck supple.  Cardiovascular: Normal rate, regular rhythm and normal heart sounds.  Exam reveals no gallop and no friction rub.   No murmur heard. Pulmonary/Chest: Effort normal and breath sounds normal. No respiratory distress.  Abdominal: Soft. He exhibits no  distension. There is no tenderness.  Musculoskeletal: He exhibits no edema or tenderness.  Chronic appearing wounds to b/l LE with black eschar. Strong odor. Primarily over b/l knees, but also anterior mid-distal thighs. Some exposed areas with purulent drainage. Maggots in several of these wounds. Easily palpable DP pulses b/l.   Neurological:  Awake. Confused. Not following commands consistently.   Skin: Skin is warm and dry.  Nursing note and vitals reviewed.   ED Course  Procedures (including critical care time) Labs Review Labs Reviewed  CULTURE, BLOOD (ROUTINE X 2)  CULTURE, BLOOD (ROUTINE X 2)  CBC WITH DIFFERENTIAL/PLATELET  COMPREHENSIVE METABOLIC PANEL  URINALYSIS, ROUTINE W REFLEX MICROSCOPIC (NOT AT [facility])  LACTIC ACID, PLASMA  LACTIC ACID, PLASMA    Imaging Review No results found.   EKG Interpretation   Date/Time:  Tuesday October 26 2014 11:49:59 EDT Ventricular Rate:  88 PR Interval:  136 QRS Duration: 78 QT Interval:  390 QTC Calculation: 472 R Axis:   46 Text Interpretation:  Sinus rhythm Probable left atrial enlargement RSR'  in V1 or V2, right VCD or RVH Confirmed by Michelena Culmer  MD, Jaskirat Schwieger ([id number]) on  10/26/2014 11:53:08 AM      MDM   Final diagnoses:  Wound abscess  Altered mental status    64yM with necrotic wounds to b/l LE. Wounds primarily centered over knees. Suspect probably falling with hx of ETOH abuse and sustained wounds that never had proper care. Good distal pulses. Discussed with general surgery. Since wounds  primarily over knees, they feel best left to orthopedics depending on how extensively he may need debridement.     Raeford RazorStephen Jaren Kearn, MD 10/31/14 [phone]

## 2014-10-27 ENCOUNTER — Inpatient Hospital Stay (HOSPITAL_COMMUNITY): Payer: Medicare Other

## 2014-10-27 ENCOUNTER — Encounter (HOSPITAL_COMMUNITY): Payer: Self-pay | Admitting: General Practice

## 2014-10-27 DIAGNOSIS — S81001D Unspecified open wound, right knee, subsequent encounter: Secondary | ICD-10-CM

## 2014-10-27 DIAGNOSIS — S81002D Unspecified open wound, left knee, subsequent encounter: Secondary | ICD-10-CM

## 2014-10-27 DIAGNOSIS — K089 Disorder of teeth and supporting structures, unspecified: Secondary | ICD-10-CM | POA: Diagnosis present

## 2014-10-27 DIAGNOSIS — G9340 Encephalopathy, unspecified: Secondary | ICD-10-CM | POA: Diagnosis present

## 2014-10-27 DIAGNOSIS — F1010 Alcohol abuse, uncomplicated: Secondary | ICD-10-CM | POA: Diagnosis present

## 2014-10-27 DIAGNOSIS — B9561 Methicillin susceptible Staphylococcus aureus infection as the cause of diseases classified elsewhere: Secondary | ICD-10-CM | POA: Diagnosis present

## 2014-10-27 DIAGNOSIS — S71101D Unspecified open wound, right thigh, subsequent encounter: Secondary | ICD-10-CM

## 2014-10-27 DIAGNOSIS — D62 Acute posthemorrhagic anemia: Secondary | ICD-10-CM

## 2014-10-27 DIAGNOSIS — S2232XA Fracture of one rib, left side, initial encounter for closed fracture: Secondary | ICD-10-CM | POA: Diagnosis present

## 2014-10-27 DIAGNOSIS — IMO0002 Reserved for concepts with insufficient information to code with codable children: Secondary | ICD-10-CM | POA: Diagnosis present

## 2014-10-27 DIAGNOSIS — B9689 Other specified bacterial agents as the cause of diseases classified elsewhere: Secondary | ICD-10-CM

## 2014-10-27 DIAGNOSIS — X58XXXD Exposure to other specified factors, subsequent encounter: Secondary | ICD-10-CM

## 2014-10-27 DIAGNOSIS — M009 Pyogenic arthritis, unspecified: Secondary | ICD-10-CM | POA: Diagnosis present

## 2014-10-27 DIAGNOSIS — F1910 Other psychoactive substance abuse, uncomplicated: Secondary | ICD-10-CM | POA: Diagnosis present

## 2014-10-27 DIAGNOSIS — N289 Disorder of kidney and ureter, unspecified: Secondary | ICD-10-CM

## 2014-10-27 DIAGNOSIS — S71102D Unspecified open wound, left thigh, subsequent encounter: Secondary | ICD-10-CM

## 2014-10-27 DIAGNOSIS — R7881 Bacteremia: Secondary | ICD-10-CM | POA: Diagnosis present

## 2014-10-27 LAB — CBC
HCT: 27.1 % — ABNORMAL LOW (ref 39.0–52.0)
HCT: 25.3 % — ABNORMAL LOW (ref 39.0–52.0)
HEMOGLOBIN: 9.1 g/dL — AB (ref 13.0–17.0)
Hemoglobin: 8.6 g/dL — ABNORMAL LOW (ref 13.0–17.0)
MCH: 29.5 pg (ref 26.0–34.0)
MCH: 29.7 pg (ref 26.0–34.0)
MCHC: 33.6 g/dL (ref 30.0–36.0)
MCHC: 34 g/dL (ref 30.0–36.0)
MCV: 88 fL (ref 78.0–100.0)
MCV: 87.2 fL (ref 78.0–100.0)
PLATELETS: 406 10*3/uL — AB (ref 150–400)
Platelets: 463 10*3/uL — ABNORMAL HIGH (ref 150–400)
RBC: 3.08 MIL/uL — ABNORMAL LOW (ref 4.22–5.81)
RBC: 2.9 MIL/uL — AB (ref 4.22–5.81)
RDW: 14.3 % (ref 11.5–15.5)
RDW: 14 % (ref 11.5–15.5)
WBC: 24.3 10*3/uL — ABNORMAL HIGH (ref 4.0–10.5)
WBC: 14.3 10*3/uL — ABNORMAL HIGH (ref 4.0–10.5)

## 2014-10-27 LAB — BASIC METABOLIC PANEL
ANION GAP: 11 (ref 5–15)
BUN: 25 mg/dL — ABNORMAL HIGH (ref 6–20)
CHLORIDE: 101 mmol/L (ref 101–111)
CO2: 24 mmol/L (ref 22–32)
CREATININE: 1.84 mg/dL — AB (ref 0.61–1.24)
Calcium: 8.1 mg/dL — ABNORMAL LOW (ref 8.9–10.3)
GFR calc non Af Amer: 37 mL/min — ABNORMAL LOW (ref >60)
GFR, EST AFRICAN AMERICAN: 43 mL/min — AB (ref >60)
GLUCOSE: 103 mg/dL — AB (ref 65–99)
POTASSIUM: 4.1 mmol/L (ref 3.5–5.1)
Sodium: 136 mmol/L (ref 135–145)

## 2014-10-27 LAB — AMMONIA: Ammonia: 41 umol/L — ABNORMAL HIGH (ref 9–35)

## 2014-10-27 LAB — TSH: TSH: 1.765 u[IU]/mL (ref 0.350–4.500)

## 2014-10-27 MED ORDER — MORPHINE SULFATE 4 MG/ML IJ SOLN
4.0000 mg | INTRAMUSCULAR | Status: DC | PRN
  Administered 2014-10-27 – 2014-11-05 (×12): 4 mg via INTRAVENOUS
  Filled 2014-10-27 (×12): qty 1

## 2014-10-27 MED ORDER — ENSURE ENLIVE PO LIQD
237.0000 mL | Freq: Three times a day (TID) | ORAL | Status: DC
  Administered 2014-10-27 – 2014-11-05 (×15): 237 mL via ORAL

## 2014-10-27 NOTE — Progress Notes (Signed)
Initial Nutrition Assessment  DOCUMENTATION CODES:   Not applicable  INTERVENTION:   Ensure Enlive po TID, each supplement provides 350 kcal and 20 grams of protein  NUTRITION DIAGNOSIS:   Increased nutrient needs related to wound healing as evidenced by estimated needs.   GOAL:   Patient will meet greater than or equal to 90% of their needs   MONITOR:   PO intake, Supplement acceptance, Labs, Weight trends, Skin, I & O's  REASON FOR ASSESSMENT:   Consult Assessment of nutrition requirement/status  ASSESSMENT:   64 year old male with past medical history of alcohol abuse and homelessness presents with bilateral leg wounds. Patient currently unable to provide significant past medical history secondary to somewhat altered mental status. He does state that he fell this past week however is unable to provide additional history. Patient was taken to the OR last evening for debridement. Wound vacs are currently in place in the bilateral lower extremities. Please see resident note for additional history present illness.  S/p Procedure on 10/26/14: 1. Sharp excisional debridement of left thigh eschar, fascia, muscle and subcutaneous tissue measuring 29 cm x 11 cm 2. Arthrotomy of left knee joint 3. Sharp excisional debridement of right thigh eschar, fascia, muscle and subcutaneous tissue measuring 27 cm x 10 cm 4. Arthrotomy of right knee joint 5. Adjacent tissue rearrangement left thigh 20 cm x 10 cm 6. Adjacent tissue rearrangement right thigh 4 cm x 11 cm 7. Application of negative wound therapy greater than 50 cm  Pt admitted with bilateral anterior thigh eschar with frank purulence and maggots.  Pt sleeping soundly at time of visit. Unable to obtain hx or perform nutrition focused physical exam at this time.   Meal tray at bedside has been unattempted. Pt is at high nutritional risk due to increased needs for wound healing. RD to will supplement diet to increase nutritional  intake.   Reviewed [facility] note on 10/27/14. Pt with full thickness wounds on bilateral legs; pt with two wound vacs. Plan to changes VAC dressings on 10/28/14 or 10/29/14 in OR.   Diet Order:  Diet regular Room service appropriate?: Yes; Fluid consistency:: Thin Diet NPO time specified  Skin:  Wound (see comment) (necrotic wounds with lt and rt legs; bilateral wound vac)  Last BM:  10/26/14  Height:   Ht Readings from Last 1 Encounters:  10/26/14 5\' 6"  (1.676 m)    Weight:   Wt Readings from Last 1 Encounters:  10/26/14 151 lb 0.2 oz (68.5 kg)    Ideal Body Weight:  64.5 kg  Wt Readings from Last 10 Encounters:  10/26/14 151 lb 0.2 oz (68.5 kg)    BMI:  Body mass index is 24.39 kg/(m^2).  Estimated Nutritional Needs:   Kcal:  2000-2200  Protein:  105-120 grams  Fluid:  2.0-2.2 L  EDUCATION NEEDS:   No education needs identified at this time  Deontrey Massi A. Mayford KnifeWilliams, RD, LDN, CDE Pager: [phone] After hours Pager: [phone]

## 2014-10-27 NOTE — Consult Note (Signed)
[facility]    Date of Admission:  10/26/2014           Day 2 vancomycin        Day 2 piperacillin tazobactam       Reason for Consult:severe, bilateral infected leg wounds    Referring Physician: Dr. Tawanna Cooler McDiarmid  Principal Problem:   Wound abscess Active Problems:   Wounds, multiple   Bacteremia   Alcohol abuse   Encephalopathy   Acute blood loss anemia   Acute renal insufficiency   Polysubstance abuse   Poor dentition   Left rib fracture   . feeding supplement (ENSURE ENLIVE)  237 mL Oral TID BM  . folic acid  1 mg Oral Daily  . heparin  5,000 Units Subcutaneous 3 times per day  . multivitamin with minerals  1 tablet Oral Daily  . piperacillin-tazobactam (ZOSYN)  IV  3.375 g Intravenous Q8H  . thiamine  100 mg Oral Daily   Or  . thiamine  100 mg Intravenous Daily  . vancomycin  1,000 mg Intravenous Q24H    Recommendations: 1. Continue vancomycin and piperacillin tazobactam pending results of blood and wound cultures  2. Will need repeat blood cultures in 24-48 hours 3. Await results of HIV antibody  Assessment: History this has extensive bilateral leg wounds extending into his knees. He has bacteremia. The Gram stain of his left knee specimen suggest polymicrobial infection so I will continue current antibiotics pending final culture results. He will need repeat blood cultures soon and may need echocardiography.    HPI: Timothy Norman is a 64 y.o. male who presented to the emergency department yesterday complaining of leg pain. In extensive bilateral wounds of his thighs contaminated with maggots. He states that he fell on Friday sustaining these wounds but he also tells me that he has been hospitalized for one week. He underwent emergent debridement of his wounds and had arthroscopic washout of both knees. Admission blood cultures are ready growing gram-positive cocci in clusters in both sets. The Gram stain of specimens  obtained from his left knee show gram-positive cocci in pairs and gram-positive rods. Gram stains from other sites showed no organisms. All cultures are pending.   Review of Systems: Review of systems not obtained due to patient factors.  Past Medical History  Diagnosis Date  . ETOH abuse     History  Substance Use Topics  . Smoking status: Current Every Day Smoker -- 0.25 packs/day    Types: Cigarettes  . Smokeless tobacco: Never Used  . Alcohol Use: 1.2 oz/week    2 Cans of beer per week     Comment: Pt states, "I just drink"    History reviewed. No pertinent family history. No Known Allergies  OBJECTIVE: Blood pressure 107/60, pulse 90, temperature 98.2 F (36.8 C), temperature source Oral, resp. rate 20, height 5\' 6"  (1.676 m), weight 151 lb 0.2 oz (68.5 kg), SpO2 100 %. General: he is alert but very weak and in no distress. He is confused. He has difficulty answering questions Skin: no acute rash Oral: Many missing teeth. No oropharyngeal lesions noted Lungs: clear Cor: regular S1 and S2 with no murmurs Abdomen: soft and nontender with no palpable masses Surgical dressings in place on both legs  Lab Results Lab Results  Component Value Date   WBC 24.3* 10/27/2014   HGB 9.1* 10/27/2014   HCT 27.1* 10/27/2014   MCV 88.0 10/27/2014  PLT 463* 10/27/2014    Lab Results  Component Value Date   CREATININE 1.84* 10/27/2014   BUN 25* 10/27/2014   NA 136 10/27/2014   K 4.1 10/27/2014   CL 101 10/27/2014   CO2 24 10/27/2014    Lab Results  Component Value Date   ALT 25 10/26/2014   AST 49* 10/26/2014   ALKPHOS 55 10/26/2014   BILITOT 2.6* 10/26/2014     Microbiology: Recent Results (from the past 240 hour(s))  Blood culture (routine x 2)     Status: None (Preliminary result)   Collection Time: 10/26/14 12:30 PM  Result Value Ref Range Status   Specimen Description BLOOD RIGHT ARM  Final   Special Requests BOTTLES DRAWN AEROBIC AND ANAEROBIC 5CC  Final    Culture  Setup Time   Final    GRAM POSITIVE COCCI IN CLUSTERS IN BOTH AEROBIC AND ANAEROBIC BOTTLES CRITICAL RESULT CALLED TO, READ BACK BY AND VERIFIED WITH: A.Ricki MillerSAUNDERS,RN [id number] 10/27/14 M.Brand Siever GRAM STAIN REVIEWED-AGREE WITH RESULT S. YARBROUGH    Culture NO GROWTH < 24 HOURS  Final   Report Status PENDING  Incomplete  Blood culture (routine x 2)     Status: None (Preliminary result)   Collection Time: 10/26/14 12:30 PM  Result Value Ref Range Status   Specimen Description BLOOD LEFT ARM  Final   Special Requests BOTTLES DRAWN AEROBIC AND ANAEROBIC 5CC  Final   Culture  Setup Time   Final    GRAM POSITIVE COCCI IN CLUSTERS AEROBIC BOTTLE ONLY CRITICAL RESULT CALLED TO, READ BACK BY AND VERIFIED WITH: A.Ricki MillerSAUNDERS,RN [id number] 10/27/14 M.Jacques Fife GRAM STAIN REVIEWED-AGREE WITH RESULT S. YARBROUGH    Culture NO GROWTH < 24 HOURS  Final   Report Status PENDING  Incomplete  Body fluid culture     Status: None (Preliminary result)   Collection Time: 10/26/14  5:49 PM  Result Value Ref Range Status   Specimen Description SYNOVIAL LEFT KNEE  Final   Special Requests FLUID ON A SWAB  Final   Gram Stain   Final    FEW WBC PRESENT,BOTH PMN AND MONONUCLEAR NO ORGANISMS SEEN    Culture NO GROWTH < 12 HOURS  Final   Report Status PENDING  Incomplete  Anaerobic culture     Status: None (Preliminary result)   Collection Time: 10/26/14  5:49 PM  Result Value Ref Range Status   Specimen Description SYNOVIAL LEFT KNEE  Final   Special Requests A FLUID ON SWAB  Final   Gram Stain   Final    FEW WBC PRESENT,BOTH PMN AND MONONUCLEAR NO ORGANISMS SEEN    Culture   Final    NO ANAEROBES ISOLATED; CULTURE IN PROGRESS FOR 5 DAYS   Report Status PENDING  Incomplete  Fungus Culture with Smear     Status: None (Preliminary result)   Collection Time: 10/26/14  5:49 PM  Result Value Ref Range Status   Specimen Description SYNOVIAL LEFT KNEE  Final   Special Requests A FLUID ON SWAB  Final   Fungal  Smear   Final    NO YEAST OR FUNGAL ELEMENTS SEEN [facility]formed [facility]    Culture   Final    CULTURE IN PROGRESS FOR FOUR WEEKS Performed at [facility]    Report Status PENDING  Incomplete  AFB culture with smear     Status: None (Preliminary result)   Collection Time: 10/26/14  5:49 PM  Result Value Ref Range Status   Specimen Description  SYNOVIAL LEFT KNEE  Final   Special Requests A FLUID ON SWAB  Final   Acid Fast Smear   Final    NO ACID FAST BACILLI SEEN Performed at [facility]    Culture   Final    CULTURE WILL BE EXAMINED FOR 6 WEEKS BEFORE ISSUING A FINAL REPORT Performed at [facility]    Report Status PENDING  Incomplete  Anaerobic culture     Status: None (Preliminary result)   Collection Time: 10/26/14  5:51 PM  Result Value Ref Range Status   Specimen Description SYNOVIAL RIGHT KNEE  Final   Special Requests B FLUID ON SWAB  Final   Gram Stain   Final    FEW WBC PRESENT,BOTH PMN AND MONONUCLEAR NO ORGANISMS SEEN    Culture   Final    NO ANAEROBES ISOLATED; CULTURE IN PROGRESS FOR 5 DAYS   Report Status PENDING  Incomplete  Body fluid culture     Status: None (Preliminary result)   Collection Time: 10/26/14  5:51 PM  Result Value Ref Range Status   Specimen Description SYNOVIAL RIGHT KNEE  Final   Special Requests B FLUID ON SWAB  Final   Gram Stain   Final    FEW WBC PRESENT,BOTH PMN AND MONONUCLEAR NO ORGANISMS SEEN    Culture NO GROWTH < 12 HOURS  Final   Report Status PENDING  Incomplete  Fungus Culture with Smear     Status: None (Preliminary result)   Collection Time: 10/26/14  5:51 PM  Result Value Ref Range Status   Specimen Description SYNOVIAL RIGHT KNEE  Final   Special Requests B FLUID ON SWAB  Final   Fungal Smear   Final    NO YEAST OR FUNGAL ELEMENTS SEEN Performed at [facility]    Culture   Final    CULTURE IN PROGRESS FOR FOUR WEEKS Performed at [facility]    Report  Status PENDING  Incomplete  AFB culture with smear     Status: None (Preliminary result)   Collection Time: 10/26/14  5:51 PM  Result Value Ref Range Status   Specimen Description SYNOVIAL RIGHT KNEE  Final   Special Requests B FLUID ON SWAB  Final   Acid Fast Smear   Final    NO ACID FAST BACILLI SEEN Performed at [facility]    Culture   Final    CULTURE WILL BE EXAMINED FOR 6 WEEKS BEFORE ISSUING A FINAL REPORT Performed at [facility]    Report Status PENDING  Incomplete  AFB culture with smear     Status: None (Preliminary result)   Collection Time: 10/26/14  5:51 PM  Result Value Ref Range Status   Specimen Description TISSUE RIGHT KNEE  Final   Special Requests D PT ON ZOSYN VANCOMYCIN  Final   Acid Fast Smear   Final    NO ACID FAST BACILLI SEEN Performed at [facility]    Culture   Final    CULTURE WILL BE EXAMINED FOR 6 WEEKS BEFORE ISSUING A FINAL REPORT Performed at [facility]    Report Status PENDING  Incomplete  Anaerobic culture     Status: None (Preliminary result)   Collection Time: 10/26/14  5:55 PM  Result Value Ref Range Status   Specimen Description TISSUE LEFT KNEE  Final   Special Requests C  Final   Gram Stain   Final    NO WBC SEEN FEW GRAM  POSITIVE COCCI IN PAIRS FEW GRAM POSITIVE RODS Performed at [facility]    Culture   Final    NO ANAEROBES ISOLATED; CULTURE IN PROGRESS FOR 5 DAYS Performed at [facility]    Report Status PENDING  Incomplete  Tissue culture     Status: None (Preliminary result)   Collection Time: 10/26/14  5:55 PM  Result Value Ref Range Status   Specimen Description TISSUE LEFT KNEE  Final   Special Requests C  Final   Gram Stain   Final    NO WBC SEEN FEW GRAM POSITIVE COCCI IN PAIRS FEW GRAM POSITIVE RODS Performed at [facility]    Culture PENDING  Incomplete   Report Status PENDING  Incomplete  Fungus Culture with Smear     Status: None  (Preliminary result)   Collection Time: 10/26/14  5:55 PM  Result Value Ref Range Status   Specimen Description TISSUE LEFT KNEE  Final   Special Requests C  Final   Fungal Smear   Final    NO YEAST OR FUNGAL ELEMENTS SEEN Performed at [facility]    Culture   Final    CULTURE IN PROGRESS FOR FOUR WEEKS Performed at [facility]    Report Status PENDING  Incomplete  AFB culture with smear     Status: None (Preliminary result)   Collection Time: 10/26/14  5:55 PM  Result Value Ref Range Status   Specimen Description TISSUE LEFT KNEE  Final   Special Requests C  Final   Acid Fast Smear   Final    NO ACID FAST BACILLI SEEN Performed at [facility]    Culture   Final    CULTURE WILL BE EXAMINED FOR 6 WEEKS BEFORE ISSUING A FINAL REPORT Performed at [facility]    Report Status PENDING  Incomplete  Anaerobic culture     Status: None (Preliminary result)   Collection Time: 10/26/14  5:57 PM  Result Value Ref Range Status   Specimen Description TISSUE RIGHT KNEE  Final   Special Requests D PT ON ZOSYN AND VANCOMYCIN  Final   Gram Stain PENDING  Incomplete   Culture   Final    NO ANAEROBES ISOLATED; CULTURE IN PROGRESS FOR 5 DAYS Performed at [facility]    Report Status PENDING  Incomplete  Fungus Culture with Smear     Status: None (Preliminary result)   Collection Time: 10/26/14  5:57 PM  Result Value Ref Range Status   Specimen Description TISSUE RIGHT KNEE  Final   Special Requests D PT ON ZOSYN VANCOMYCIN  Final   Fungal Smear   Final    NO YEAST OR FUNGAL ELEMENTS SEEN Performed at [facility]    Culture   Final    CULTURE IN PROGRESS FOR FOUR WEEKS Performed at [facility]    Report Status PENDING  Incomplete  Tissue culture     Status: None (Preliminary result)   Collection Time: 10/26/14  5:57 PM  Result Value Ref Range Status   Specimen Description TISSUE RIGHT KNEE  Final   Special Requests D PT  ON ZOSYN VANCOMYCIN  Final   Gram Stain   Final    NO WBC SEEN NO ORGANISMS SEEN Performed at [facility]    Culture PENDING  Incomplete   Report Status PENDING  Incomplete    Cliffton Asters, MD [facility] [phone] pager  [phone] cell 10/27/2014, 5:32 PM

## 2014-10-27 NOTE — H&P (Signed)
Patient becoming very agitated & refusing to finish admission questions . RN Informed

## 2014-10-27 NOTE — Care Management Note (Addendum)
Case Management Note  Patient Details  Name: Timothy Norman MRN: [id number] Date of Birth: 11/26/1950  Subjective/Objective:      Patient lives with cousin, but not for sure, RN trying to contact Timothy Norman , who is patient's cousin to get a better history on patient.  Patient with two wound vacs.  IF patient is dc with wound vacs may need snf.  Physical therapy has been ordered.  NCM contacted Rickie with [employer] and faxed information to her.  CSW is aware patient has wound vac  And it is a possibility he may need snf.  NCM notifed Dr. Roda ShuttersXu to sign wound vac script left on the chart.                 Action/Plan:   Expected Discharge Date:                  Expected Discharge Plan:  Skilled Nursing Facility  In-House Referral:  Clinical Social Work  Discharge planning Services  CM Consult  Post Acute Care Choice:    Choice offered to:     DME Arranged:    DME Agency:     HH Arranged:    HH Agency:     Status of Service:  In process, will continue to follow  Medicare Important Message Given:    Date Medicare IM Given:    Medicare IM give by:    Date Additional Medicare IM Given:    Additional Medicare Important Message give by:     If discussed at Long Length of Stay Meetings, dates discussed:    Additional Comments:  Leone Havenaylor, Cassondra Stachowski [place], RN 10/27/2014, 11:19 AM

## 2014-10-27 NOTE — Progress Notes (Signed)
Family Medicine Teaching Service Daily Progress Note Intern Pager: [phone]  Patient name: Timothy Norman Medical record number: [id number] Date of birth: 1950/07/13 Age: 64 y.o. Gender: male  Primary Care Provider: No primary care provider on file. Consultants: Ortho Code Status: unable to obtain due to AMS  Pt Overview and Major Events to Date:  07/12: Admitted to floor, Ortho debrided legs in OR 07/13: Wound VAC in place BL LE, ID consult  Assessment and Plan: Timothy Norman is a 64 y.o. male presenting with pain in his bilateral legs following a fall . PMH is significant for alcohol abuse.   Cellulitis with multiple necrotic wounds; Patient claims that the wounds he has on bilateral anterior legs happened when he fell on Friday but the appearance of the wounds look like it has been infected for months. There are multiple areas of necrotic tissue infested with maggots. Many of the maggots were removed in the ED. Most likely cellulitis based on the appearance. Possible gangrene. WBC 16.2 on admission, no signs of fever and vitals have been stable. Possible osteomyelitis depending on the depth of the wounds over the knees, X-Rays did not reveal osteo. L knee tissue culture revealed no WBC, few gram positive cocci in pairs few gram positive rods. R knee tissue culture has no growth. -Orthopedic surgery debriding wounds on 7/12 -Continue Vanc (Day 2) -Continue Zosyn (Day 2) -Monitor for fever -Monitor WBC, 16.2 -DC Dilaudid, continue Morphine 4mg  q4 PRN -appreciate orthopedic assistance  Bacteremia; Gram + cocci in clusters seen in blood culture.  -Continue Vanc and Zosyn -Monitor WBC -Monitor vitals (has been afebrile) -Head CT unremarkable -Echo pending -f/u ID consult   AMS; Initially suspected alcohol intoxication but it alcohol level was below detectable range. Possible other illicit substance intoxication. Due to history of alcohol abuse, could be due to elevated ammonia  level secondary to cirrhosis. LFTs fairly normal and normal INR but mild hyperbili and hypoalbuminemia. Glucose levels only mildly elevated, most likely not hypoglycemic episode. Psychiatric history unknown, possible schizophrenia. Although WBC 16.2, most likely not sepsis based on normal vital signs.  -UDS positive for benzos and opiates but was given both in ED -Check ammonia level -Monitor mental status -Call family member to get better history, name is Timothy Norman (cousin): [phone]  Alcohol Abuse; Chronic -consult social work -Automotive engineer  Homelessness -Social work consulted  FEN/GI: regular diet Prophylaxis: none  FEN/GI: NS @ 168mL/hr, regular PPx: Heparin  Disposition: stable  Subjective:  Patient was seen this morning, he is laying comfortably in bed in NAD. He was unable to answer most of my questions and still appears to be AMS. He is unable to tell me his name, where he is, or what day it is. He commented that his buttocks and arms are itchy and would like a bath.  Most likely this is his baseline but I will need to call "Timothy Norman" who is his cousin so he can give me more information on his history.   Objective: Temp:  [95.8 F (35.4 C)-98.9 F (37.2 C)] 98.8 F (37.1 C) (07/12 2318) Pulse Rate:  [77-107] 95 (07/12 2029) Resp:  [12-32] 20 (07/12 2029) BP: (93-193)/(61-94) 145/88 mmHg (07/12 2029) SpO2:  [96 %-100 %] 100 % (07/12 2029) Weight:  [151 lb 0.2 oz (68.5 kg)-156 lb (70.761 kg)] 151 lb 0.2 oz (68.5 kg) (07/12 1642) Physical Exam: General: In NAD, laying in bed crying. Alert but not oriented Cardiovascular: RRR, normal s1 and s2,. No murmurs. +2 dorsalis  pedis pulses bilaterally  Respiratory: clear to auscultation bilaterally, no increased WOB Abdomen: (+) BS, nontender, distended most likely from constipation Extremities: wound vac in place in bilateral lower extremities, draining serosanginous fluid. 2+ pulses bilateral dorsalis pedis  Laboratory:  Recent  Labs Lab 10/26/14 1230  WBC 16.2*  HGB 14.6  HCT 42.2  PLT 382    Recent Labs Lab 10/26/14 1230  NA 133*  K 4.8  CL 92*  CO2 26  BUN 24*  CREATININE 1.44*  CALCIUM 8.8*  PROT 8.1  BILITOT 2.6*  ALKPHOS 55  ALT 25  AST 49*  GLUCOSE 104*     Imaging/Diagnostic Tests: Dg Chest 1 View  10/26/2014   CLINICAL DATA:  Multiple falls.  EXAM: CHEST  1 VIEW  COMPARISON:  April 29, 2004.  FINDINGS: The heart size and mediastinal contours are within normal limits. Both lungs are clear. No pneumothorax or pleural effusion is noted. Possible acute minimally displaced fracture involving the left seventh rib. Old left rib fractures are noted.  IMPRESSION: Possible acute minimally displaced fracture involving anterior portion of left seventh rib. No acute pulmonary disease is noted.   Electronically Signed   By: Lupita RaiderJames  Green Jr, M.D.   On: 10/26/2014 14:18   Dg Knee Complete 4 Views Left  10/26/2014   CLINICAL DATA:  Left knee pain for 4 days with recent falls. Initial encounter.  EXAM: LEFT KNEE - COMPLETE 4+ VIEW  COMPARISON:  None.  FINDINGS: There is no evidence of fracture, dislocation, or joint effusion. There is no evidence of arthropathy or other focal bone abnormality. Large soft tissue laceration or defect is seen anterior to the patella. No radiopaque foreign body is noted.  IMPRESSION: No fracture or dislocation is noted. Large soft tissue laceration seen anterior to the patella.   Electronically Signed   By: Lupita RaiderJames  Green Jr, M.D.   On: 10/26/2014 14:13   Dg Knee Complete 4 Views Right  10/26/2014   CLINICAL DATA:  Bilateral anterior knee pain, recent falls, and soft tissue swelling  EXAM: RIGHT KNEE - COMPLETE 4+ VIEW  COMPARISON:  10/26/2014  FINDINGS: Soft tissue swelling and irregularity of the distal thigh and over the anterior knee on the cross-table lateral view. No radiopaque foreign body. No acute osseous finding, malalignment, or effusion. Peripheral atherosclerosis noted.   IMPRESSION: Anterior right distal thigh and knee region soft tissue irregularity and swelling suspicious for ongoing soft tissue infection.  No acute osseous finding, joint abnormality or effusion.   Electronically Signed   By: Judie PetitM.  Shick M.D.   On: 10/26/2014 14:12   Dg Femur Min 2 Views Left  10/26/2014   CLINICAL DATA:  64 year old male with bilateral thigh and knee pain for the past 4 days. Deep wound anterior lower thigh and knee region. Recent falls. Alcohol abuse. Initial encounter.  EXAM: LEFT FEMUR 2 VIEWS  COMPARISON:  None.  FINDINGS: No fracture or dislocation.  Soft tissue wound anterior aspect of the lower left thigh. No plain film evidence of adjacent osteomyelitis.  Wound/soft tissue defect anterior aspect of the patella. Patella may be visible clinically. No plain film evidence of osteomyelitis.  IMPRESSION: No fracture or dislocation.  Soft tissue wound anterior aspect of the lower left thigh. No plain film evidence of adjacent osteomyelitis.  Wound/soft tissue defect anterior aspect of the patella. Patella may be visible clinically. No plain film evidence of osteomyelitis.   Electronically Signed   By: Lacy DuverneySteven  Olson M.D.   On: 10/26/2014 14:13  Dg Femur, Min 2 Views Right  10/26/2014   CLINICAL DATA:  Bilateral thigh pain, soft tissue wounds, recent falls, abscess  EXAM: RIGHT FEMUR 2 VIEWS  COMPARISON:  10/26/2014  FINDINGS: Right femur appears intact without acute osseous finding or fracture. Soft tissue swelling again noted with irregularity of the soft tissues of the distal anterior thigh and knee region. Peripheral atherosclerosis evident. No radiopaque foreign body.  IMPRESSION: Intact right femur.  No acute osseous finding  Anterior distal thigh and knee region soft tissue irregularity and swelling  Peripheral atherosclerosis   Electronically Signed   By: Judie Petit.  Shick M.D.   On: 10/26/2014 14:14     Beaulah Dinning, MD 10/27/2014, 6:52 AM PGY-1, [facility] FPTS  Intern pager: [phone], text pages welcome

## 2014-10-27 NOTE — Progress Notes (Signed)
Lab called with blood culture results - positive for gram + cocci in clusters x 2 sets, MD with family medicine notified at this time. No new orders received.

## 2014-10-27 NOTE — Consult Note (Signed)
WOC wound consult note Reason for Consult: Consult requested for bilat thigh wounds.  Ortho service is now following for assessment and plan of care; pt went to the OR yesterday for debridement and had bilat Vac dressings placed. Wound type: Full thickness to bilat thighs Dressing procedure/placement/frequency: Vac dressings are intact with good seal and are Yed together at 125mm cont suction.  Small amt pink drainage in the cannister.  Ortho service plans to change the Vac dressings in the OR on Thurs or Fri, according to the EMR.   Please re-consult if further assistance is needed.  Thank-you,  Cammie Mcgeeawn Lashya Passe MSN, RN, CWOCN, [place]CN-AP, CNS [phone]

## 2014-10-28 DIAGNOSIS — M009 Pyogenic arthritis, unspecified: Secondary | ICD-10-CM

## 2014-10-28 DIAGNOSIS — L0889 Other specified local infections of the skin and subcutaneous tissue: Secondary | ICD-10-CM

## 2014-10-28 DIAGNOSIS — T814XXD Infection following a procedure, subsequent encounter: Secondary | ICD-10-CM

## 2014-10-28 DIAGNOSIS — N289 Disorder of kidney and ureter, unspecified: Secondary | ICD-10-CM

## 2014-10-28 DIAGNOSIS — R4182 Altered mental status, unspecified: Secondary | ICD-10-CM | POA: Diagnosis present

## 2014-10-28 LAB — RPR: RPR Ser Ql: NONREACTIVE

## 2014-10-28 LAB — HEMOGLOBIN A1C
Hgb A1c MFr Bld: 5.6 % (ref 4.8–5.6)
MEAN PLASMA GLUCOSE: 114 mg/dL

## 2014-10-28 LAB — HIV ANTIBODY (ROUTINE TESTING W REFLEX): HIV SCREEN 4TH GENERATION: NONREACTIVE

## 2014-10-28 LAB — VITAMIN B12: Vitamin B-12: 405 pg/mL (ref 180–914)

## 2014-10-28 NOTE — Progress Notes (Signed)
Family Medicine Teaching Service Daily Progress Note Intern Pager: [phone]  Patient name: Timothy Norman Medical record number: [id number] Date of birth: 10/18/1950 Age: 64 y.o. Gender: male  Primary Care Provider: No primary care provider on file. Consultants: Ortho Code Status: unable to obtain due to AMS  Pt Overview and Major Events to Date:  07/12: Admitted to floor, Ortho debrided legs in OR 07/13: Wound VAC in place BL LE, ID consult  Assessment and Plan: Timothy DubinWarren D Lemler is a 64 y.o. male presenting with pain in his bilateral legs following a fall . PMH is significant for alcohol abuse.   Cellulitis with multiple necrotic wounds; Patient claims that the wounds he has on bilateral anterior legs happened when he fell on Friday but the appearance of the wounds look like it has been infected for months. There are multiple areas of necrotic tissue infested with maggots. Many of the maggots were removed in the ED. Most likely cellulitis based on the appearance. Possible gangrene. WBC 16.2 on admission, no signs of fever and vitals have been stable. Possible osteomyelitis depending on the depth of the wounds over the knees, X-Rays did not reveal osteo. L knee tissue culture revealed no WBC, few gram positive cocci in pairs few gram positive rods. R knee tissue culture has no growth. -Orthopedic surgery debriding wounds on 7/12 -Continue Vanc (Day 3) -Continue Zosyn (Day 3) -Monitor for fever -Monitor WBC -DC Dilaudid, continue Morphine 4mg  q4 PRN -appreciate orthopedic assistance  Bacteremia; Gram + cocci in clusters seen in blood culture.  -Continue Vanc and Zosyn -Monitor WBC -Monitor vitals (has been afebrile) -Head CT unremarkable -Echo pending  AMS; Initially suspected alcohol intoxication but alcohol level was below detectable range. Possible other illicit substance intoxication. Due to history of alcohol abuse, could be due to elevated ammonia level secondary to cirrhosis.  LFTs fairly normal and normal INR but mild hyperbili and hypoalbuminemia. Glucose levels only mildly elevated, most likely not hypoglycemic episode. Psychiatric history unknown, possible schizophrenia. Although WBC 16.2, most likely not sepsis based on normal vital signs.  -UDS positive for benzos and opiates but was given both in ED -Monitor ammonia level -Monitor mental status -Call family member to get better history, name is Dorma RussellJB (cousin): [phone]  Alcohol Abuse; Chronic -consult social work -Automotive engineerCIWA protocol  Homelessness -Social work consulted  FEN/GI: regular diet Prophylaxis: none  FEN/GI: NS @ 13500mL/hr, regular PPx: Heparin  Disposition: stable  Subjective:  Per the nurse, last night she spoke with "JB" who visited the patient. JB stated  this is the patient's baseline mental status, he did not provide her with much more information. I have number for him and will call him today to get more history.  Patient was seen this morning, he is laying comfortably in bed in NAD. His mental status is still slightly altered but he is able to answer more questions than he was yesterday. While patient was talking to me he would go in and out of somnolence. Patient was frustrated that no one wanted to talk to him and he wanted me to stay in the room. When I went to do the physical exam, he initially swatted my hand away and got irritated.  Objective: Temp:  [98 F (36.7 C)-98.2 F (36.8 C)] 98 F (36.7 C) (07/14 0552) Pulse Rate:  [90-97] 94 (07/14 0552) Resp:  [18-20] 18 (07/14 0552) BP: (102-107)/(54-60) 102/54 mmHg (07/14 0552) SpO2:  [100 %] 100 % (07/14 0552) Physical Exam: General: In NAD, laying  in bed crying. Alert but not oriented Cardiovascular: RRR, normal s1 and s2,. No murmurs. +2 dorsalis pedis pulses bilaterally  Respiratory: clear to auscultation bilaterally, no increased WOB Abdomen: (+) BS, nontender, nondistended Extremities: wound vac in place in bilateral  lower extremities, draining serosanginous fluid. 2+ pulses bilateral dorsalis pedis  Laboratory:  Recent Labs Lab 10/26/14 1230 10/27/14 0550 10/27/14 1840  WBC 16.2* 24.3* 14.3*  HGB 14.6 9.1* 8.6*  HCT 42.2 27.1* 25.3*  PLT 382 463* 406*    Recent Labs Lab 10/26/14 1230 10/27/14 0550  NA 133* 136  K 4.8 4.1  CL 92* 101  CO2 26 24  BUN 24* 25*  CREATININE 1.44* 1.84*  CALCIUM 8.8* 8.1*  PROT 8.1  --   BILITOT 2.6*  --   ALKPHOS 55  --   ALT 25  --   AST 49*  --   GLUCOSE 104* 103*     Imaging/Diagnostic Tests: Ct Head Wo Contrast  10/27/2014   CLINICAL DATA:  64 year old male with altered mental status  EXAM: CT HEAD WITHOUT CONTRAST  TECHNIQUE: Contiguous axial images were obtained from the base of the skull through the vertex without intravenous contrast.  COMPARISON:  10/16/2009  FINDINGS: Bilateral frontal lobe encephalomalacia is again noted. The appearance is similar to previous exam. Mild patchy low attenuation within the subcortical and periventricular white matter within the parietal lobes also noted compatible with chronic microvascular disease. No acute cortical infarct, hemorrhage, or mass lesion ispresent. Ventricles are of normal size. No significant extra-axial fluid collection is present. Mucosal thickening involving the left maxillary sinus noted. Remaining paranasal sinuses are clear. The mastoid air cells are clear. The calvarium appears intact.  IMPRESSION: 1. No acute intracranial abnormalities. 2. 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   On: 10/27/2014 13:23     Beaulah Dinning, MD 10/28/2014, 7:36 AM PGY-1,  Family Medicine FPTS Intern pager: [phone], text pages welcome

## 2014-10-28 NOTE — Clinical Social Work Note (Addendum)
Clinical Social Work Assessment  Patient Details  Name: Timothy DubinWarren D Raspberry MRN: [id number] Date of Birth: 05/08/1950  Date of referral:  10/28/14               Reason for consult:  Discharge Planning, Facility Placement                Permission sought to share information with:  Family Supports, Oceanographeracility Contact Representative Permission granted to share information::  Yes, Verbal Permission Granted  Name::     Chief Operating OfficerJB  Agency::  [facility]NF[facility]  Relationship::     Contact Information:  [phone]  Housing/Transportation Living arrangements for the past 2 months:  Single Family Home Source of Information:  Other (Comment Required) (JB patient's cousin who also acts as patient's payee) Patient Interpreter Needed:  None Criminal Activity/Legal Involvement Pertinent to Current Situation/Hospitalization:  No - Comment as needed Significant Relationships:  Other Family Members (JB ) Lives with:  Self Do you feel safe going back to the place where you live?  No Need for family participation in patient care:  Yes (Comment)  Care giving concerns:  Patient's family member JB (cousin) states that he believes the patient will benefit from permanent placement (SNF initially then transition to ALF). JB believes the patient may be able to return home in the distant future but doesn't think this is possible at this time.   Social Worker assessment / plan:  Per chart patient is confused and agitated. CSW notes that patient present with ETOH use and multiple wounds. CSW spoke with patient's cousin JB to complete assessment. JB states that the patient has been living at home alone, but JB checks in on him frequently. In JB's opinion, the patient is at his baseline level of functioning cognitively. JB acts as the patient's payee and agrees that the patient will likely need placement at discharge. CSW explained SNF search/placement process and answered JB's questions. CSW has encouraged JB to apply for Medicaid for the  patient in case the patient will require any form of long term permanent placement. JB plans to start Medicaid application ASAP. JB will also speak with the patient regarding placement. Apparently the patient will listen to JB, so he would like to talk to him first about this. PATIENT IS NOT HOMELESS.  Employment status:  Disabled (Comment on whether or not currently receiving Disability) Insurance information:  Medicare PT Recommendations:    Information / Referral to community resources:  Skilled Nursing Facility  Patient/Family's Response to care:  JB states that he appreciates everything that is being done for the patient.  Patient/Family's Understanding of and Emotional Response to Diagnosis, Current Treatment, and Prognosis:  Patient appears to have little insight into reason for admission, diagnosis, and needs post DC. JB has a better understanding of this and is willing to commit to assisting with placement process as needed.  Emotional Assessment Appearance:  Appears stated age Attitude/Demeanor/Rapport:  Other (Agitated) Affect (typically observed):  Agitated Orientation:  Oriented to Self Alcohol / Substance use:  Tobacco Use, Alcohol Use Psych involvement (Current and /or in the community):     Discharge Needs  Concerns to be addressed:  Discharge Planning Concerns Readmission within the last 30 days:  No Current discharge risk:  Physical Impairment, Cognitively Impaired, Chronically ill Barriers to Discharge:  Continued Medical Work up   The KrogerBryant Kuron Docken MSW, [place] TreeLCSWA, [place] Rapids[place]ASA, [phone]

## 2014-10-28 NOTE — Discharge Summary (Signed)
Family Medicine Teaching [facility] Discharge Summary  Patient name: Timothy Norman Medical record number: [id number] Date of birth: 03/17/1951 Age: 64 y.o. Gender: male Date of Admission: 10/26/2014  Date of Discharge: 11/05/2014 Admitting Physician: Leighton Roach McDiarmid, MD  Primary Care Provider: No primary care provider on file. Consultants: Orthopedic surgery, ID  Indication for Hospitalization: bilateral necrotic leg wounds and bacteremia  Discharge Diagnoses/Problem List:  Patient Active Problem List   Diagnosis Date Noted  . Pseudomonas infection   . Septic arthritis of knee, right 10/29/2014  . Altered mental status   . Alcohol abuse 10/27/2014  . Acute renal insufficiency 10/27/2014  . Polysubstance abuse 10/27/2014  . Poor dentition 10/27/2014  . Left rib fracture 10/27/2014  . Septic arthritis of knee, left 10/27/2014  . ETOH abuse 10/27/2014  . Wound abscess   . Encephalopathy   . Staphylococcus aureus bacteremia   . Acute blood loss anemia   . Wounds, multiple 10/26/2014    Disposition: SNF with wound vacs  Discharge Condition: stable  Discharge Exam:  General: NAD, sitting in bedside chair.  Cardiovascular: RRR, no murmurs Respiratory: clear to auscultation bilaterally, no increased WOB Abdomen: (+) BS, nontender, nondistended Extremities: wound vac in place in bilateral lower extremities, draining serosanginous fluid.  Neuro: alert. No focal neurologic deficits.  Psych: poor insight. (+) visual hallucinations  Brief Hospital Course:   Bilateral Necrotic Leg Wounds Patient was admitted for bilateral leg pain secondary to necrotic maggot filled wounds on 7/12. Since the wounds were primarily over the knees, orthopedic surgery was consulted. On 7/12 Irrigation and Debridement of bilateral necrotic leg wounds (muscle, subcutaneous tissue, and skin) and wound VAC initiated. On 7/15 ortho repeated I&D of bilateral leg wounds. Vancomycin was initiated on  7/12-7/14.  Leg wounds grew Pseudomonas. Was initially given Dilaudid for pain control and then switched to tylenol and IV morphine  q4hrs prn. It was discovered that patient wasn't requesting PRN morphine, was unsure if patient had the mental capacity to request PRN medication. Oxycodone q6 was initiated on 7/19 and provided good pain control.   Bacteremia Cultures were taken of blood after leukocytosis. Blood culture on 7/12 grew MSSA.  ID was consulted, Zosyn was initiated on 7/11 and will sent with medication upon discharge. Will need to take Zosyn until 7/28. Echo was ordered and was unremarkable for endocarditis. TEE was performed on 7/20 and revealed no septal vegetations. Second blood culture on 7/15 had no growth after 5 days.  Cognitive Impairment Patient was initially thought to be AMS secondary to alcohol or illicit substance intoxication. He has a history alcohol abuse. EtOH screening was negative and UDS was positive for benzo and opioids which he got in the ED. It was discovered that this is patient's baseline per his cousin JB. 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 Patient had positive hallucinations while in the hospital such as seeing cars driving outside in the sky.   Issues for Follow Up:  1. Bilateral leg wounds: Wound vacs will need to be in place for the next 1-2 months, wound vac will be changed MWF in SNF and will need to see Dr. Kelly Splinter for further care., Continue Zosyn until 7/28 2. Bacteremia: will need to finish taking Zosyn until 7/28 3. Cognitive Impairment: suspect underlying dementia or schizophrenia. Consider psych eval  Significant Procedures:  7/12: Irrigation and Debridement of bilateral necrotic leg wounds (muscle, subcutaneous tissue, and skin); wound VAC initiated 7/15: Repeat I&D of bilateral leg wounds  Significant Labs and Imaging:   Recent Labs Lab 11/03/14 1454  WBC 7.6  HGB 7.8*  HCT 24.3*  PLT 485*    Recent  Labs Lab 10/31/14 0355  NA 139  K 3.7  CL 106  CO2 27  GLUCOSE 94  BUN 12  CREATININE 1.18  CALCIUM 8.2*     Results/Tests Pending at Time of Discharge: none  Discharge Medications:    Medication List    TAKE these medications        Chlorhexidine Gluconate Cloth 2 % Pads  Apply 6 each topically daily.     diphenhydrAMINE 25 mg capsule  Commonly known as:  BENADRYL  Take 1 capsule (25 mg total) by mouth every 6 (six) hours as needed for itching.     feeding supplement (ENSURE ENLIVE) Liqd  Take 237 mLs by mouth 3 (three) times daily between meals.     feeding supplement (PRO-STAT SUGAR FREE 64) Liqd  Take 30 mLs by mouth 2 (two) times daily with a meal.     folic acid 1 MG tablet  Commonly known as:  FOLVITE  Take 1 tablet (1 mg total) by mouth daily.     oxyCODONE 5 MG immediate release tablet  Commonly known as:  Oxy IR/ROXICODONE  Take 1 tablet (5 mg total) by mouth every 6 (six) hours.     piperacillin-tazobactam 3.375 GM/50ML IVPB  Commonly known as:  ZOSYN  Inject 50 mLs (3.375 g total) into the vein every 8 (eight) hours.     polyethylene glycol packet  Commonly known as:  MIRALAX / GLYCOLAX  Take 17 g by mouth daily as needed for mild constipation.     thiamine 100 MG tablet  Take 1 tablet (100 mg total) by mouth daily.        Discharge Instructions: Please refer to Patient Instructions section of EMR for full details.  Patient was counseled important signs and symptoms that should prompt return to medical care, changes in medications, dietary instructions, activity restrictions, and follow up appointments.   Follow-Up Appointments: Dr. Kelly SplinterSanger with plastic surgery   Beaulah Dinninghristina M Jaymari Cromie, MD 11/05/2014, 12:25 PM PGY-1, [facility]

## 2014-10-28 NOTE — Evaluation (Signed)
Physical Therapy Evaluation Patient Details Name: Timothy DubinWarren D Norman MRN: [id number] DOB: 02/02/1951 Today's Date: 10/28/2014   History of Present Illness  Pt adm with severe bil thigh wounds and lt septic knee. Pt with maggots in wounds. Pt underwent surgical debridement of wounds and VAC placement 10/28/14. Pt also with AMS. PMH - etoh abuse,   Clinical Impression  Pt admitted with above diagnosis and presents to PT with functional limitations due to deficits listed below (See PT problem list). Pt needs skilled PT to maximize independence and safety to allow discharge to SNF. Pt only able to tolerate minimal mobility due to BLE pain. Expect progress will be slow.     Follow Up Recommendations SNF    Equipment Recommendations  Other (comment) (to be assessed)    Recommendations for Other Services       Precautions / Restrictions Precautions Precautions: Fall;Other (comment) Precaution Comments: Had maggots and bed bugs so gown, head bonnet, shoe covers Restrictions Weight Bearing Restrictions: No      Mobility  Bed Mobility Overal bed mobility: Needs Assistance Bed Mobility: Supine to Sit;Sit to Supine     Supine to sit: Max assist Sit to supine: Max assist   General bed mobility comments: Pt unable to tolerate bringing LE's off EOB to sit EOB. Pt able to assist pulling trunk forward to long sit. Only willing to tolerate briefly.  Transfers                 General transfer comment: Unable to tolerate.  Ambulation/Gait                Stairs            Wheelchair Mobility    Modified Rankin (Stroke Patients Only)       Balance                                             Pertinent Vitals/Pain Pain Assessment: Faces Faces Pain Scale: Hurts worst Pain Location: BLE's Pain Descriptors / Indicators: Moaning;Grimacing (yelling out with pain) Pain Intervention(s): Repositioned    Home Living Family/patient expects to be  discharged to:: Skilled nursing facility                      Prior Function Level of Independence:  (Unknown)         Comments: Likely pt ambulatory.     Hand Dominance        Extremity/Trunk Assessment   Upper Extremity Assessment: Overall WFL for tasks assessed           Lower Extremity Assessment: RLE deficits/detail;LLE deficits/detail RLE Deficits / Details: Assist to move and pt with knees extended and unable to tolerate any knee movement LLE Deficits / Details: Assist to move and pt with knees extended and unable to tolerate any knee movement     Communication      Cognition Arousal/Alertness: Awake/alert Behavior During Therapy: Flat affect Overall Cognitive Status: No family/caregiver present to determine baseline cognitive functioning Area of Impairment: Orientation;Attention;Memory;Problem solving;Safety/judgement Orientation Level: Disoriented to;Situation;Place;Time Current Attention Level: Sustained Memory: Decreased short-term memory;Decreased recall of precautions   Safety/Judgement: Decreased awareness of safety   Problem Solving: Slow processing;Requires verbal cues      General Comments      Exercises        Assessment/Plan    PT Assessment  Patient needs continued PT services  PT Diagnosis Acute pain;Difficulty walking   PT Problem List Decreased strength;Decreased range of motion;Decreased activity tolerance;Decreased balance;Decreased mobility;Decreased knowledge of use of DME;Decreased safety awareness;Decreased knowledge of precautions;Pain  PT Treatment Interventions DME instruction;Gait training;Functional mobility training;Therapeutic activities;Therapeutic exercise;Balance training;Cognitive remediation;Patient/family education   PT Goals (Current goals can be found in the Care Plan section) Acute Rehab PT Goals Patient Stated Goal: Pt unable PT Goal Formulation: Patient unable to participate in goal setting Time For  Goal Achievement: 11/11/14 Potential to Achieve Goals: Fair    Frequency Min 3X/week   Barriers to discharge Decreased caregiver support      Co-evaluation               End of Session   Activity Tolerance: Patient limited by pain Patient left: in bed;with call bell/phone within reach;with bed alarm set Nurse Communication: Mobility status         Time: 1440-1454 PT Time Calculation (min) (ACUTE ONLY): 14 min   Charges:   PT Evaluation $Initial PT Evaluation Tier I: 1 Procedure     PT G Codes:        Timothy Norman 13-Nov-2014, 3:27 PM  [facility] PT [phone]

## 2014-10-28 NOTE — Clinical Social Work Placement (Signed)
   CLINICAL SOCIAL WORK PLACEMENT  NOTE  Date:  10/28/2014  Patient Details  Name: Timothy Norman MRN: [id number] Date of Birth: 10/31/1950  Clinical Social Work is seeking post-discharge placement for this patient at the Skilled  Nursing Facility level of care (*CSW will initial, date and re-position this form in  chart as items are completed):  Yes   Patient/family provided with [place] Clinical Social Work Department's list of facilities offering this level of care within the geographic area requested by the patient (or if unable, by the patient's family).  Yes   Patient/family informed of their freedom to choose among providers that offer the needed level of care, that participate in Medicare, Medicaid or managed care program needed by the patient, have an available bed and are willing to accept the patient.  Yes   Patient/family informed of [facility] interest [facility] and [facility], as well as of the fact that they are under no obligation to receive care at these facilities.  PASRR submitted to EDS on 10/28/14     PASRR number received on 10/28/14     Existing PASRR number confirmed on       FL2 transmitted to all facilities in geographic area requested by pt/family on 10/28/14     FL2 transmitted to all facilities within larger geographic area on       Patient informed that his/her managed care company has contracts with or will negotiate with certain facilities, including the following:            Patient/family informed of bed offers received.  Patient chooses bed at       Physician recommends and patient chooses bed at      Patient to be transferred to   on  .  Patient to be transferred to facility by       Patient family notified on   of transfer.  Name of family member notified:        PHYSICIAN       Additional Comment:    _______________________________________________ Roddie McBryant Luci Bellucci MSW, [place], [place], [phone]

## 2014-10-28 NOTE — Progress Notes (Signed)
   Subjective:  No events.  Objective:   VITALS:   Filed Vitals:   10/27/14 0859 10/27/14 1500 10/28/14 0007 10/28/14 0552  BP: 102/60 107/60  102/54  Pulse: 97 90 91 94  Temp:  98.2 F (36.8 C) 98 F (36.7 C) 98 F (36.7 C)  TempSrc:  Oral Oral   Resp:  20 20 18   Height:      Weight:      SpO2:  100% 100% 100%    VAC with good seal and suction   Lab Results  Component Value Date   WBC 14.3* 10/27/2014   HGB 8.6* 10/27/2014   HCT 25.3* 10/27/2014   MCV 87.2 10/27/2014   PLT 406* 10/27/2014     Assessment/Plan:  2 Days Post-Op   - plan for repeat I&D tomorrow - NPO after midnight  Cheral AlmasXu, Naiping Michael 10/28/2014, 7:42 AM [phone]

## 2014-10-28 NOTE — H&P (Signed)
H&P update  The surgical history has been reviewed and remains accurate without interval change.  The patient was re-examined and patient's physiologic condition has not changed significantly in the last 30 days. The condition still exists that makes this procedure necessary. The treatment plan remains the same, without new options for care.  No new pharmacological allergies or types of therapy has been initiated that would change the plan or the appropriateness of the plan.  The patient and/or family understand the potential benefits and risks.  Timothy ReelN. Michael Xu, MD 10/28/2014 11:21 PM

## 2014-10-28 NOTE — Progress Notes (Signed)
NCM faxed script for wound vac to Dr. Roda ShuttersXu , to sign, awaiting for him to sign and fax back.

## 2014-10-28 NOTE — Progress Notes (Signed)
Patient ID: Timothy Norman, male   DOB: 07/17/1950, 64 y.o.   MRN: [id number]         [facility]    Date of Admission:  10/26/2014           Day 3 vancomycin        Day 3 piperacillin tazobactam  Principal Problem:   Wound abscess Active Problems:   Wounds, multiple   Bacteremia   Septic arthritis of knee, left   Alcohol abuse   Encephalopathy   Acute blood loss anemia   Acute renal insufficiency   Polysubstance abuse   Poor dentition   Left rib fracture   ETOH abuse   . feeding supplement (ENSURE ENLIVE)  237 mL Oral TID BM  . folic acid  1 mg Oral Daily  . heparin  5,000 Units Subcutaneous 3 times per day  . multivitamin with minerals  1 tablet Oral Daily  . piperacillin-tazobactam (ZOSYN)  IV  3.375 g Intravenous Q8H  . thiamine  100 mg Oral Daily   Or  . thiamine  100 mg Intravenous Daily  . vancomycin  1,000 mg Intravenous Q24H    Subjective: He states that he wants to be left alone so he can sleep.  Review of Systems: Pertinent items are noted in HPI.  Past Medical History  Diagnosis Date  . ETOH abuse     History  Substance Use Topics  . Smoking status: Current Every Day Smoker -- 0.25 packs/day    Types: Cigarettes  . Smokeless tobacco: Never Used  . Alcohol Use: 1.2 oz/week    2 Cans of beer per week     Comment: Pt states, "I just drink"    History reviewed. No pertinent family history. No Known Allergies  OBJECTIVE: Blood pressure 116/54, pulse 95, temperature 98.6 F (37 C), temperature source Oral, resp. rate 18, height 5\' 6"  (1.676 m), weight 151 lb 0.2 oz (68.5 kg), SpO2 100 %. General: He is a little more alert today. Skin: No splinter or conjunctival hemorrhages noted Lungs: Clear Cor: Regular S1 and S2 with no murmurs Abdomen: Soft and nontender Back dressings in place bilaterally on distal thighs  Lab Results Lab Results  Component Value Date   WBC 14.3* 10/27/2014   HGB 8.6* 10/27/2014   HCT  25.3* 10/27/2014   MCV 87.2 10/27/2014   PLT 406* 10/27/2014    Lab Results  Component Value Date   CREATININE 1.84* 10/27/2014   BUN 25* 10/27/2014   NA 136 10/27/2014   K 4.1 10/27/2014   CL 101 10/27/2014   CO2 24 10/27/2014    Lab Results  Component Value Date   ALT 25 10/26/2014   AST 49* 10/26/2014   ALKPHOS 55 10/26/2014   BILITOT 2.6* 10/26/2014     Microbiology: Recent Results (from the past 240 hour(s))  Blood culture (routine x 2)     Status: None (Preliminary result)   Collection Time: 10/26/14 12:30 PM  Result Value Ref Range Status   Specimen Description BLOOD RIGHT ARM  Final   Special Requests BOTTLES DRAWN AEROBIC AND ANAEROBIC 5CC  Final   Culture  Setup Time   Final    GRAM POSITIVE COCCI IN CLUSTERS IN BOTH AEROBIC AND ANAEROBIC BOTTLES CRITICAL RESULT CALLED TO, READ BACK BY AND VERIFIED WITH: A.Ricki MillerSAUNDERS,RN [id number] 10/27/14 M.Jaye Saal GRAM STAIN REVIEWED-AGREE WITH RESULT S. YARBROUGH    Culture STAPHYLOCOCCUS AUREUS  Final   Report Status PENDING  Incomplete  Blood  culture (routine x 2)     Status: None (Preliminary result)   Collection Time: 10/26/14 12:30 PM  Result Value Ref Range Status   Specimen Description BLOOD LEFT ARM  Final   Special Requests BOTTLES DRAWN AEROBIC AND ANAEROBIC 5CC  Final   Culture  Setup Time   Final    GRAM POSITIVE COCCI IN CLUSTERS AEROBIC BOTTLE ONLY CRITICAL RESULT CALLED TO, READ BACK BY AND VERIFIED WITH: A.Ricki Miller 1610 10/27/14 M.Emmani Lesueur GRAM STAIN REVIEWED-AGREE WITH RESULT S. YARBROUGH    Culture GRAM POSITIVE COCCI  Final   Report Status PENDING  Incomplete  Body fluid culture     Status: None (Preliminary result)   Collection Time: 10/26/14  5:49 PM  Result Value Ref Range Status   Specimen Description SYNOVIAL LEFT KNEE  Final   Special Requests FLUID ON A SWAB  Final   Gram Stain   Final    FEW WBC PRESENT,BOTH PMN AND MONONUCLEAR NO ORGANISMS SEEN    Culture CULTURE REINCUBATED FOR BETTER  GROWTH  Final   Report Status PENDING  Incomplete  Anaerobic culture     Status: None (Preliminary result)   Collection Time: 10/26/14  5:49 PM  Result Value Ref Range Status   Specimen Description SYNOVIAL LEFT KNEE  Final   Special Requests A FLUID ON SWAB  Final   Gram Stain   Final    FEW WBC PRESENT,BOTH PMN AND MONONUCLEAR NO ORGANISMS SEEN    Culture   Final    NO ANAEROBES ISOLATED; CULTURE IN PROGRESS FOR 5 DAYS   Report Status PENDING  Incomplete  Fungus Culture with Smear     Status: None (Preliminary result)   Collection Time: 10/26/14  5:49 PM  Result Value Ref Range Status   Specimen Description SYNOVIAL LEFT KNEE  Final   Special Requests A FLUID ON SWAB  Final   Fungal Smear   Final    NO YEAST OR FUNGAL ELEMENTS SEEN Performed at [facility]    Culture   Final    CULTURE IN PROGRESS FOR FOUR WEEKS Performed at [facility]    Report Status PENDING  Incomplete  AFB culture with smear     Status: None (Preliminary result)   Collection Time: 10/26/14  5:49 PM  Result Value Ref Range Status   Specimen Description SYNOVIAL LEFT KNEE  Final   Special Requests A FLUID ON SWAB  Final   Acid Fast Smear   Final    NO ACID FAST BACILLI SEEN Performed at [facility]    Culture   Final    CULTURE WILL BE EXAMINED FOR 6 WEEKS BEFORE ISSUING A FINAL REPORT Performed at [facility]    Report Status PENDING  Incomplete  Anaerobic culture     Status: None (Preliminary result)   Collection Time: 10/26/14  5:51 PM  Result Value Ref Range Status   Specimen Description SYNOVIAL RIGHT KNEE  Final   Special Requests B FLUID ON SWAB  Final   Gram Stain   Final    FEW WBC PRESENT,BOTH PMN AND MONONUCLEAR NO ORGANISMS SEEN    Culture   Final    NO ANAEROBES ISOLATED; CULTURE IN PROGRESS FOR 5 DAYS   Report Status PENDING  Incomplete  Body fluid culture     Status: None (Preliminary result)   Collection Time: 10/26/14  5:51 PM  Result  Value Ref Range Status   Specimen Description SYNOVIAL RIGHT KNEE  Final  Special Requests B FLUID ON SWAB  Final   Gram Stain   Final    FEW WBC PRESENT,BOTH PMN AND MONONUCLEAR NO ORGANISMS SEEN    Culture NO GROWTH 2 DAYS  Final   Report Status PENDING  Incomplete  Fungus Culture with Smear     Status: None (Preliminary result)   Collection Time: 10/26/14  5:51 PM  Result Value Ref Range Status   Specimen Description SYNOVIAL RIGHT KNEE  Final   Special Requests B FLUID ON SWAB  Final   Fungal Smear   Final    NO YEAST OR FUNGAL ELEMENTS SEEN Performed at [facility]    Culture   Final    CULTURE IN PROGRESS FOR FOUR WEEKS Performed at [facility]    Report Status PENDING  Incomplete  AFB culture with smear     Status: None (Preliminary result)   Collection Time: 10/26/14  5:51 PM  Result Value Ref Range Status   Specimen Description SYNOVIAL RIGHT KNEE  Final   Special Requests B FLUID ON SWAB  Final   Acid Fast Smear   Final    NO ACID FAST BACILLI SEEN Performed at [facility]    Culture   Final    CULTURE WILL BE EXAMINED FOR 6 WEEKS BEFORE ISSUING A FINAL REPORT Performed at [facility]    Report Status PENDING  Incomplete  AFB culture with smear     Status: None (Preliminary result)   Collection Time: 10/26/14  5:51 PM  Result Value Ref Range Status   Specimen Description TISSUE RIGHT KNEE  Final   Special Requests D PT ON ZOSYN VANCOMYCIN  Final   Acid Fast Smear   Final    NO ACID FAST BACILLI SEEN Performed at [facility]    Culture   Final    CULTURE WILL BE EXAMINED FOR 6 WEEKS BEFORE ISSUING A FINAL REPORT Performed at [facility]    Report Status PENDING  Incomplete  Anaerobic culture     Status: None (Preliminary result)   Collection Time: 10/26/14  5:55 PM  Result Value Ref Range Status   Specimen Description TISSUE LEFT KNEE  Final   Special Requests C  Final   Gram Stain   Final    NO  WBC SEEN FEW GRAM POSITIVE COCCI IN PAIRS FEW GRAM POSITIVE RODS Performed at [facility]    Culture   Final    NO ANAEROBES ISOLATED; CULTURE IN PROGRESS FOR 5 DAYS Performed at [facility]    Report Status PENDING  Incomplete  Tissue culture     Status: None (Preliminary result)   Collection Time: 10/26/14  5:55 PM  Result Value Ref Range Status   Specimen Description TISSUE LEFT KNEE  Final   Special Requests C  Final   Gram Stain   Final    NO WBC SEEN FEW GRAM POSITIVE COCCI IN PAIRS FEW GRAM POSITIVE RODS Performed at [facility]    Culture   Final    Culture reincubated for better growth Performed at [facility]    Report Status PENDING  Incomplete  Fungus Culture with Smear     Status: None (Preliminary result)   Collection Time: 10/26/14  5:55 PM  Result Value Ref Range Status   Specimen Description TISSUE LEFT KNEE  Final   Special Requests C  Final   Fungal Smear   Final    NO YEAST OR  FUNGAL ELEMENTS SEEN Performed at [facility]    Culture   Final    CULTURE IN PROGRESS FOR FOUR WEEKS Performed at [facility]    Report Status PENDING  Incomplete  AFB culture with smear     Status: None (Preliminary result)   Collection Time: 10/26/14  5:55 PM  Result Value Ref Range Status   Specimen Description TISSUE LEFT KNEE  Final   Special Requests C  Final   Acid Fast Smear   Final    NO ACID FAST BACILLI SEEN Performed at [facility]    Culture   Final    CULTURE WILL BE EXAMINED FOR 6 WEEKS BEFORE ISSUING A FINAL REPORT Performed at [facility]    Report Status PENDING  Incomplete  Anaerobic culture     Status: None (Preliminary result)   Collection Time: 10/26/14  5:57 PM  Result Value Ref Range Status   Specimen Description TISSUE RIGHT KNEE  Final   Special Requests D PT ON ZOSYN AND VANCOMYCIN  Final   Gram Stain PENDING  Incomplete   Culture   Final    NO ANAEROBES ISOLATED;  CULTURE IN PROGRESS FOR 5 DAYS Performed at [facility]    Report Status PENDING  Incomplete  Fungus Culture with Smear     Status: None (Preliminary result)   Collection Time: 10/26/14  5:57 PM  Result Value Ref Range Status   Specimen Description TISSUE RIGHT KNEE  Final   Special Requests D PT ON ZOSYN VANCOMYCIN  Final   Fungal Smear   Final    NO YEAST OR FUNGAL ELEMENTS SEEN Performed at [facility]    Culture   Final    CULTURE IN PROGRESS FOR FOUR WEEKS Performed at [facility]    Report Status PENDING  Incomplete  Tissue culture     Status: None (Preliminary result)   Collection Time: 10/26/14  5:57 PM  Result Value Ref Range Status   Specimen Description TISSUE RIGHT KNEE  Final   Special Requests D PT ON ZOSYN VANCOMYCIN  Final   Gram Stain   Final    NO WBC SEEN NO ORGANISMS SEEN Performed at [facility]    Culture   Final    FEW GRAM NEGATIVE RODS Performed at [facility]    Report Status PENDING  Incomplete    Assessment: He has staph aureus bacteremia complicating polymicrobial soft tissue and knee infection of both legs. I will continue vancomycin and piperacillin tazobactam pending final cultures. I will repeat blood cultures today and an echocardiogram is pending.  Plan: 1. Continue current antibiotics 2. Repeat blood cultures 3. TTE 4. Repeat I&D tomorrow  Cliffton Asters, MD [facility] [phone] pager   [phone] cell 10/28/2014, 3:06 PM

## 2014-10-28 NOTE — Care Management Important Message (Signed)
Important Message  Patient Details  Name: Timothy Norman MRN: [id number] Date of Birth: 01/06/1951   Medicare Important Message Given:  Yes-second notification given    Leone Havenaylor, Emmary Culbreath Clinton, RN 10/28/2014, 10:46 AMImportant Message  Patient Details  Name: Timothy DubinWarren D Norman MRN: [id number] Date of Birth: 11/12/1950   Medicare Important Message Given:  Yes-second notification given    Leone Havenaylor, Fynn Vanblarcom Clinton, RN 10/28/2014, 10:46 AM

## 2014-10-29 ENCOUNTER — Encounter (HOSPITAL_COMMUNITY)
Admission: EM | Disposition: A | Discharge status: Skilled Nursing Facility | Payer: Self-pay | Source: Home / Self Care | Attending: Family Medicine

## 2014-10-29 ENCOUNTER — Ambulatory Visit (HOSPITAL_COMMUNITY): Payer: Medicare Other

## 2014-10-29 ENCOUNTER — Inpatient Hospital Stay (HOSPITAL_COMMUNITY): Payer: Medicare Other | Admitting: Anesthesiology

## 2014-10-29 ENCOUNTER — Encounter (HOSPITAL_COMMUNITY): Payer: Self-pay | Admitting: *Deleted

## 2014-10-29 DIAGNOSIS — B9561 Methicillin susceptible Staphylococcus aureus infection as the cause of diseases classified elsewhere: Secondary | ICD-10-CM

## 2014-10-29 DIAGNOSIS — M009 Pyogenic arthritis, unspecified: Secondary | ICD-10-CM | POA: Diagnosis present

## 2014-10-29 DIAGNOSIS — F1910 Other psychoactive substance abuse, uncomplicated: Secondary | ICD-10-CM

## 2014-10-29 DIAGNOSIS — R410 Disorientation, unspecified: Secondary | ICD-10-CM

## 2014-10-29 HISTORY — PX: I&D EXTREMITY: SHX5045

## 2014-10-29 LAB — SURGICAL PCR SCREEN
MRSA, PCR: NEGATIVE
Staphylococcus aureus: POSITIVE — AB

## 2014-10-29 LAB — HEPATITIS PANEL, ACUTE
Hep A IgM: NEGATIVE
Hep B C IgM: NEGATIVE
Hepatitis B Surface Ag: NEGATIVE

## 2014-10-29 SURGERY — IRRIGATION AND DEBRIDEMENT EXTREMITY
Anesthesia: General | Site: Knee | Laterality: Bilateral

## 2014-10-29 MED ORDER — ROCURONIUM BROMIDE 100 MG/10ML IV SOLN
INTRAVENOUS | Status: DC | PRN
  Administered 2014-10-29: 10 mg via INTRAVENOUS

## 2014-10-29 MED ORDER — LORAZEPAM 1 MG PO TABS
1.0000 mg | ORAL_TABLET | Freq: Four times a day (QID) | ORAL | Status: AC | PRN
  Administered 2014-10-29 – 2014-11-01 (×3): 1 mg via ORAL
  Filled 2014-10-29 (×3): qty 1

## 2014-10-29 MED ORDER — CEFAZOLIN SODIUM-DEXTROSE 2-3 GM-% IV SOLR
INTRAVENOUS | Status: DC | PRN
  Administered 2014-10-29: 2 g via INTRAVENOUS

## 2014-10-29 MED ORDER — PROMETHAZINE HCL 25 MG/ML IJ SOLN: 6.2500 mg | INTRAMUSCULAR | Status: DC | PRN

## 2014-10-29 MED ORDER — MIDAZOLAM HCL 5 MG/5ML IJ SOLN
INTRAMUSCULAR | Status: DC | PRN
  Administered 2014-10-29: 2 mg via INTRAVENOUS

## 2014-10-29 MED ORDER — SUCCINYLCHOLINE CHLORIDE 20 MG/ML IJ SOLN
INTRAMUSCULAR | Status: DC | PRN
  Administered 2014-10-29: 100 mg via INTRAVENOUS

## 2014-10-29 MED ORDER — LORAZEPAM 2 MG/ML IJ SOLN: 1.0000 mg | Freq: Four times a day (QID) | INTRAMUSCULAR | Status: AC | PRN

## 2014-10-29 MED ORDER — SODIUM CHLORIDE 0.9 % IR SOLN
Status: DC | PRN
  Administered 2014-10-29 (×2): 3000 mL

## 2014-10-29 MED ORDER — LIDOCAINE HCL (CARDIAC) 20 MG/ML IV SOLN
INTRAVENOUS | Status: DC | PRN
  Administered 2014-10-29: 50 mg via INTRAVENOUS

## 2014-10-29 MED ORDER — ONDANSETRON HCL 4 MG/2ML IJ SOLN
INTRAMUSCULAR | Status: DC | PRN
  Administered 2014-10-29: 4 mg via INTRAVENOUS

## 2014-10-29 MED ORDER — LACTATED RINGERS IV SOLN
INTRAVENOUS | Status: DC
  Administered 2014-10-29 (×2): via INTRAVENOUS

## 2014-10-29 MED ORDER — PHENYLEPHRINE HCL 10 MG/ML IJ SOLN
INTRAMUSCULAR | Status: DC | PRN
  Administered 2014-10-29: 40 ug via INTRAVENOUS
  Administered 2014-10-29: 80 ug via INTRAVENOUS
  Administered 2014-10-29: 40 ug via INTRAVENOUS

## 2014-10-29 MED ORDER — GLYCOPYRROLATE 0.2 MG/ML IJ SOLN
INTRAMUSCULAR | Status: DC | PRN
  Administered 2014-10-29: 0.6 mg via INTRAVENOUS

## 2014-10-29 MED ORDER — NEOSTIGMINE METHYLSULFATE 10 MG/10ML IV SOLN
INTRAVENOUS | Status: DC | PRN
  Administered 2014-10-29: 5 mg via INTRAVENOUS

## 2014-10-29 MED ORDER — FENTANYL CITRATE (PF) 100 MCG/2ML IJ SOLN
INTRAMUSCULAR | Status: DC | PRN
  Administered 2014-10-29 (×3): 50 ug via INTRAVENOUS
  Administered 2014-10-29: 100 ug via INTRAVENOUS

## 2014-10-29 MED ORDER — HYDROMORPHONE HCL 1 MG/ML IJ SOLN: 0.2500 mg | INTRAMUSCULAR | Status: DC | PRN

## 2014-10-29 MED ORDER — FENTANYL CITRATE (PF) 250 MCG/5ML IJ SOLN
INTRAMUSCULAR | Status: AC
  Filled 2014-10-29: qty 5

## 2014-10-29 MED ORDER — ACETAMINOPHEN 325 MG PO TABS
650.0000 mg | ORAL_TABLET | Freq: Four times a day (QID) | ORAL | Status: DC | PRN
  Administered 2014-10-29: 650 mg via ORAL
  Filled 2014-10-29: qty 2

## 2014-10-29 MED ORDER — PROPOFOL 10 MG/ML IV BOLUS
INTRAVENOUS | Status: AC
  Filled 2014-10-29: qty 20

## 2014-10-29 MED ORDER — LACTATED RINGERS IV SOLN: INTRAVENOUS | Status: DC

## 2014-10-29 MED ORDER — DEXAMETHASONE SODIUM PHOSPHATE 4 MG/ML IJ SOLN
INTRAMUSCULAR | Status: DC | PRN
  Administered 2014-10-29: 8 mg via INTRAVENOUS

## 2014-10-29 MED ORDER — MIDAZOLAM HCL 2 MG/2ML IJ SOLN
INTRAMUSCULAR | Status: AC
  Filled 2014-10-29: qty 2

## 2014-10-29 MED ORDER — PROPOFOL 10 MG/ML IV BOLUS
INTRAVENOUS | Status: DC | PRN
  Administered 2014-10-29: 200 mg via INTRAVENOUS
  Administered 2014-10-29: 50 mg via INTRAVENOUS

## 2014-10-29 SURGICAL SUPPLY — 72 items
BANDAGE ELASTIC 3 VELCRO ST LF (GAUZE/BANDAGES/DRESSINGS) IMPLANT
BLADE SURG 10 STRL SS (BLADE) ×3 IMPLANT
BNDG COHESIVE 1X5 TAN STRL LF (GAUZE/BANDAGES/DRESSINGS) IMPLANT
BNDG COHESIVE 4X5 TAN STRL (GAUZE/BANDAGES/DRESSINGS) ×3 IMPLANT
BNDG COHESIVE 6X5 TAN STRL LF (GAUZE/BANDAGES/DRESSINGS) ×6 IMPLANT
BNDG CONFORM 3 STRL LF (GAUZE/BANDAGES/DRESSINGS) IMPLANT
BNDG GAUZE STRTCH 6 (GAUZE/BANDAGES/DRESSINGS) ×9 IMPLANT
CANISTER WOUND CARE 500ML ATS (WOUND CARE) ×3 IMPLANT
CONNECTOR Y ATS VAC SYSTEM (MISCELLANEOUS) ×3 IMPLANT
CORDS BIPOLAR (ELECTRODE) IMPLANT
COVER MAYO STAND STRL (DRAPES) ×3 IMPLANT
COVER SURGICAL LIGHT HANDLE (MISCELLANEOUS) ×6 IMPLANT
CUFF TOURNIQUET SINGLE 24IN (TOURNIQUET CUFF) IMPLANT
CUFF TOURNIQUET SINGLE 34IN LL (TOURNIQUET CUFF) IMPLANT
CUFF TOURNIQUET SINGLE 44IN (TOURNIQUET CUFF) IMPLANT
DRAPE EXTREMITY BILATERAL (DRAPE) ×3 IMPLANT
DRAPE IMP U-DRAPE 54X76 (DRAPES) IMPLANT
DRAPE INCISE IOBAN 66X45 STRL (DRAPES) ×6 IMPLANT
DRAPE SURG 17X23 STRL (DRAPES) ×3 IMPLANT
DRAPE U-SHAPE 47X51 STRL (DRAPES) ×3 IMPLANT
DRSG MEPITEL 3X4 ME34 (GAUZE/BANDAGES/DRESSINGS) ×6 IMPLANT
DRSG MEPITEL 4X7.2 (GAUZE/BANDAGES/DRESSINGS) ×3 IMPLANT
DRSG OPSITE POSTOP 4X8 (GAUZE/BANDAGES/DRESSINGS) ×3 IMPLANT
DRSG VAC ATS LRG SENSATRAC (GAUZE/BANDAGES/DRESSINGS) ×3 IMPLANT
DRSG VAC ATS MED SENSATRAC (GAUZE/BANDAGES/DRESSINGS) ×3 IMPLANT
DURAPREP 26ML APPLICATOR (WOUND CARE) ×3 IMPLANT
ELECT CAUTERY BLADE 6.4 (BLADE) IMPLANT
ELECT REM PT RETURN 9FT ADLT (ELECTROSURGICAL) ×3
ELECTRODE REM PT RTRN 9FT ADLT (ELECTROSURGICAL) ×1 IMPLANT
FACESHIELD WRAPAROUND (MASK) IMPLANT
GAUZE SPONGE 4X4 12PLY STRL (GAUZE/BANDAGES/DRESSINGS) ×3 IMPLANT
GAUZE XEROFORM 1X8 LF (GAUZE/BANDAGES/DRESSINGS) IMPLANT
GAUZE XEROFORM 5X9 LF (GAUZE/BANDAGES/DRESSINGS) IMPLANT
GLOVE BIOGEL PI IND STRL 6.5 (GLOVE) ×1 IMPLANT
GLOVE BIOGEL PI INDICATOR 6.5 (GLOVE) ×2
GLOVE NEODERM STRL 7.5 LF PF (GLOVE) ×2 IMPLANT
GLOVE SURG NEODERM 7.5  LF PF (GLOVE) ×4
GLOVE SURG SS PI 6.5 STRL IVOR (GLOVE) ×3 IMPLANT
GOWN STRL REIN XL XLG (GOWN DISPOSABLE) ×3 IMPLANT
GOWN STRL REUS W/TWL LRG LVL3 (GOWN DISPOSABLE) ×3 IMPLANT
HANDPIECE INTERPULSE COAX TIP (DISPOSABLE) ×2
KIT BASIN OR (CUSTOM PROCEDURE TRAY) ×3 IMPLANT
KIT ROOM TURNOVER OR (KITS) ×3 IMPLANT
MANIFOLD NEPTUNE II (INSTRUMENTS) ×3 IMPLANT
NS IRRIG 1000ML POUR BTL (IV SOLUTION) ×6 IMPLANT
PACK ORTHO EXTREMITY (CUSTOM PROCEDURE TRAY) ×3 IMPLANT
PAD ABD 8X10 STRL (GAUZE/BANDAGES/DRESSINGS) ×3 IMPLANT
PAD ARMBOARD 7.5X6 YLW CONV (MISCELLANEOUS) ×6 IMPLANT
PADDING CAST ABS 4INX4YD NS (CAST SUPPLIES)
PADDING CAST ABS COTTON 4X4 ST (CAST SUPPLIES) IMPLANT
PADDING CAST COTTON 6X4 STRL (CAST SUPPLIES) IMPLANT
SET HNDPC FAN SPRY TIP SCT (DISPOSABLE) ×1 IMPLANT
SPONGE LAP 18X18 X RAY DECT (DISPOSABLE) IMPLANT
STOCKINETTE IMPERVIOUS 9X36 MD (GAUZE/BANDAGES/DRESSINGS) ×3 IMPLANT
STOCKINETTE IMPERVIOUS LG (DRAPES) ×3 IMPLANT
SUT ETHILON 2 0 FS 18 (SUTURE) IMPLANT
SUT ETHILON 2 0 PSLX (SUTURE) IMPLANT
SUT ETHILON 3 0 PS 1 (SUTURE) IMPLANT
SUT VIC AB 2-0 CT1 36 (SUTURE) IMPLANT
SUT VIC AB 2-0 FS1 27 (SUTURE) IMPLANT
SYR CONTROL 10ML LL (SYRINGE) IMPLANT
TOWEL OR 17X24 6PK STRL BLUE (TOWEL DISPOSABLE) IMPLANT
TOWEL OR 17X26 10 PK STRL BLUE (TOWEL DISPOSABLE) ×3 IMPLANT
TRAY FOLEY CATH 16FRSI W/METER (SET/KITS/TRAYS/PACK) ×3 IMPLANT
TUBE ANAEROBIC SPECIMEN COL (MISCELLANEOUS) IMPLANT
TUBE CONNECTING 12'X1/4 (SUCTIONS) ×1
TUBE CONNECTING 12X1/4 (SUCTIONS) ×2 IMPLANT
TUBE FEEDING 5FR 15 INCH (TUBING) IMPLANT
TUBING CYSTO DISP (UROLOGICAL SUPPLIES) ×3 IMPLANT
UNDERPAD 30X30 INCONTINENT (UNDERPADS AND DIAPERS) ×6 IMPLANT
WATER STERILE IRR 1000ML POUR (IV SOLUTION) IMPLANT
YANKAUER SUCT BULB TIP NO VENT (SUCTIONS) ×3 IMPLANT

## 2014-10-29 NOTE — Progress Notes (Signed)
Family Medicine Teaching Service Daily Progress Note Intern Pager: [phone]  Patient name: Timothy Norman Medical record number: [id number] Date of birth: 03/26/1951 Age: 64 y.o. Gender: male  Primary Care Provider: No primary care provider on file. Consultants: Ortho Code Status: unable to obtain due to AMS  Pt Overview and Major Events to Date:  07/12: Admitted to floor, Ortho debrided legs in OR 07/13: Wound VAC in place BL LE, ID consult  Assessment and Plan: Timothy Norman is a 64 y.o. male presenting with pain in his bilateral legs following a fall . PMH is significant for alcohol abuse.   Cellulitis with multiple necrotic wounds; Patient claims that the wounds he has on bilateral anterior legs happened when he fell on Friday but the appearance of the wounds look like it has been infected for months. There are multiple areas of necrotic tissue infested with maggots. Many of the maggots were removed in the ED. Most likely cellulitis based on the appearance. Possible gangrene. WBC 16.2 on admission, no signs of fever and vitals have been stable. Possible osteomyelitis depending on the depth of the wounds over the knees, X-Rays did not reveal osteo. L knee tissue culture revealed no WBC, few gram positive cocci in pairs few gram positive rods. R knee tissue culture has no growth. -Orthopedic surgery managing wound care -Continue Vanc (Day 4) -Continue Zosyn (Day 4) -Monitor for fever -Monitor WBC -DC Dilaudid, continue Morphine  q4 PRN -appreciate orthopedic assistance  Bacteremia; Gram + cocci in clusters seen in blood culture.  -Continue Vanc and Zosyn -Monitor WBC -Monitor vitals (has been afebrile) -Head CT unremarkable -Echo pending  AMS; Initially suspected alcohol intoxication but alcohol level was below detectable range. Possible other illicit substance intoxication. Due to history of alcohol abuse, could be due to elevated ammonia level secondary to cirrhosis. LFTs  fairly normal and normal INR but mild hyperbili and hypoalbuminemia. Glucose levels only mildly elevated, most likely not hypoglycemic episode. Psychiatric history unknown, possible schizophrenia. Although WBC 16.2, most likely not sepsis based on normal vital signs. Per social work conversation with patient's cousin (JB), pt is not homeless. He lives alone and JB "checks in on him frequently". Also, this mental status is his baseline. -UDS positive for benzos and opiates but was given both in ED -Monitor ammonia level -Monitor mental status -Call family member to get more information if needed, name is Timothy Norman (cousin): [phone]  Alcohol Abuse; Chronic -consult social work -CIWA protocol  Homelessness -Social work consulted; apparently patient is not homeless.  FEN/GI: regular diet Prophylaxis: none  FEN/GI: NS @ 161mL/hr, regular PPx: Heparin  Disposition: stable  Subjective:  Patient was seen this morning, he is laying comfortably in bed in NAD. Apparently last night he refused his blood draws. I explained to patient he needs to let nursing take blood, he said "ok". He was upset because someone told him that he will have surgery today. His mental status still appears altered but apparently this is his baseline. While talking to me he would go in and out of somnolence , this is nothing new. Patient was able to laugh when I told him a couple jokes. He requested water but I told him he can't have anything because of surgery later today.  Objective: Temp:  [98.5 F (36.9 C)-98.6 F (37 C)] 98.5 F (36.9 C) (07/14 2109) Pulse Rate:  [93-97] 93 (07/15 0613) Resp:  [18-19] 18 (07/15 0613) BP: (106-128)/(53-65) 106/53 mmHg (07/15 0613) SpO2:  [96 %-100 %]  96 % (07/15 41320613) Physical Exam: General: In NAD, laying in bed,. Alert but not oriented Cardiovascular: RRR, normal s1 and s2,. No murmurs. +2 dorsalis pedis pulses bilaterally  Respiratory: clear to auscultation bilaterally, no  increased WOB Abdomen: (+) BS, nontender, nondistended Extremities: wound vac in place in bilateral lower extremities, draining serosanginous fluid. 2+ pulses bilateral dorsalis pedis  Laboratory:  Recent Labs Lab 10/26/14 1230 10/27/14 0550 10/27/14 1840  WBC 16.2* 24.3* 14.3*  HGB 14.6 9.1* 8.6*  HCT 42.2 27.1* 25.3*  PLT 382 463* 406*    Recent Labs Lab 10/26/14 1230 10/27/14 0550  NA 133* 136  K 4.8 4.1  CL 92* 101  CO2 26 24  BUN 24* 25*  CREATININE 1.44* 1.84*  CALCIUM 8.8* 8.1*  PROT 8.1  --   BILITOT 2.6*  --   ALKPHOS 55  --   ALT 25  --   AST 49*  --   GLUCOSE 104* 103*     Imaging/Diagnostic Tests: No results found.   Beaulah Dinninghristina M Tawona Filsinger, MD 10/29/2014, 7:21 AM PGY-1, [facility] FPTS Intern pager: [phone], text pages welcome

## 2014-10-29 NOTE — Progress Notes (Signed)
Paged on-call clinician about patient refusing his lab drawn for blood cultures for the second attempt. No new orders.

## 2014-10-29 NOTE — Progress Notes (Addendum)
NCM spoke with patient about snf and hh services, patient states he wants to go home.  JB has not been here today, NCM called JB and did not get an answer.  NCM left agency hh service list in patient's room.  Patient states he would [facility] for [facility]. Script that was signed by MD was [facility] to [facility] today,awaiting to hear from them regarding vac approval.

## 2014-10-29 NOTE — Progress Notes (Signed)
Patient ID: Timothy Norman, male   DOB: 12-11-1950, 64 y.o.   MRN: [id number]         [facility]    Date of Admission:  10/26/2014           Day 4 vancomycin        Day 4 piperacillin tazobactam  Principal Problem:   Wound abscess Active Problems:   Wounds, multiple   Bacteremia   Septic arthritis of knee, left   Alcohol abuse   Encephalopathy   Acute blood loss anemia   Acute renal insufficiency   Polysubstance abuse   Poor dentition   Left rib fracture   ETOH abuse   Altered mental status   . feeding supplement (ENSURE ENLIVE)  237 mL Oral TID BM  . folic acid  1 mg Oral Daily  . multivitamin with minerals  1 tablet Oral Daily  . piperacillin-tazobactam (ZOSYN)  IV  3.375 g Intravenous Q8H  . thiamine  100 mg Oral Daily   Or  . thiamine  100 mg Intravenous Daily  . vancomycin  1,000 mg Intravenous Q24H     Past Medical History  Diagnosis Date  . ETOH abuse     History  Substance Use Topics  . Smoking status: Current Every Day Smoker -- 0.25 packs/day    Types: Cigarettes  . Smokeless tobacco: Never Used  . Alcohol Use: 1.2 oz/week    2 Cans of beer per week     Comment: Pt states, "I just drink"    History reviewed. No pertinent family history. No Known Allergies  OBJECTIVE: Blood pressure 151/91, pulse 102, temperature 97.6 F (36.4 C), temperature source Oral, resp. rate 19, height 5\' 6"  (1.676 m), weight 151 lb 0.2 oz (68.5 kg), SpO2 93 %. General: He was in the OR today for repeat I&D and VAC dressing change when I came to see him  Lab Results Lab Results  Component Value Date   WBC 14.3* 10/27/2014   HGB 8.6* 10/27/2014   HCT 25.3* 10/27/2014   MCV 87.2 10/27/2014   PLT 406* 10/27/2014    Lab Results  Component Value Date   CREATININE 1.84* 10/27/2014   BUN 25* 10/27/2014   NA 136 10/27/2014   K 4.1 10/27/2014   CL 101 10/27/2014   CO2 24 10/27/2014    Lab Results  Component Value Date   ALT 25  10/26/2014   AST 49* 10/26/2014   ALKPHOS 55 10/26/2014   BILITOT 2.6* 10/26/2014     Microbiology: Recent Results (from the past 240 hour(s))  Blood culture (routine x 2)     Status: None   Collection Time: 10/26/14 12:30 PM  Result Value Ref Range Status   Specimen Description BLOOD RIGHT ARM  Final   Special Requests BOTTLES DRAWN AEROBIC AND ANAEROBIC 5CC  Final   Culture  Setup Time   Final    GRAM POSITIVE COCCI IN CLUSTERS IN BOTH AEROBIC AND ANAEROBIC BOTTLES CRITICAL RESULT CALLED TO, READ BACK BY AND VERIFIED WITH: A.Ricki Miller [id number] 10/27/14 M.Corby Vandenberghe GRAM STAIN REVIEWED-AGREE WITH RESULT S. YARBROUGH    Culture   Final    STAPHYLOCOCCUS AUREUS STAPHYLOCOCCUS SPECIES (COAGULASE NEGATIVE)    Report Status 10/29/2014 FINAL  Final   Organism ID, Bacteria STAPHYLOCOCCUS AUREUS  Final   Organism ID, Bacteria STAPHYLOCOCCUS SPECIES (COAGULASE NEGATIVE)  Final      Susceptibility   Staphylococcus aureus - MIC*    CIPROFLOXACIN <=0.5 SENSITIVE Sensitive  ERYTHROMYCIN <=0.25 SENSITIVE Sensitive     GENTAMICIN <=0.5 SENSITIVE Sensitive     OXACILLIN 0.5 SENSITIVE Sensitive     TETRACYCLINE <=1 SENSITIVE Sensitive     VANCOMYCIN <=0.5 SENSITIVE Sensitive     TRIMETH/SULFA <=10 SENSITIVE Sensitive     CLINDAMYCIN <=0.25 SENSITIVE Sensitive     RIFAMPIN <=0.5 SENSITIVE Sensitive     Inducible Clindamycin NEGATIVE Sensitive     * STAPHYLOCOCCUS AUREUS   Staphylococcus species (coagulase negative) - MIC*    CIPROFLOXACIN <=0.5 SENSITIVE Sensitive     ERYTHROMYCIN <=0.25 SENSITIVE Sensitive     GENTAMICIN <=0.5 SENSITIVE Sensitive     OXACILLIN <=0.25 SENSITIVE Sensitive     TETRACYCLINE <=1 SENSITIVE Sensitive     VANCOMYCIN <=0.5 SENSITIVE Sensitive     TRIMETH/SULFA <=10 SENSITIVE Sensitive     CLINDAMYCIN <=0.25 SENSITIVE Sensitive     RIFAMPIN <=0.5 SENSITIVE Sensitive     Inducible Clindamycin NEGATIVE Sensitive     * STAPHYLOCOCCUS SPECIES (COAGULASE NEGATIVE)    Blood culture (routine x 2)     Status: None (Preliminary result)   Collection Time: 10/26/14 12:30 PM  Result Value Ref Range Status   Specimen Description BLOOD LEFT ARM  Final   Special Requests BOTTLES DRAWN AEROBIC AND ANAEROBIC 5CC  Final   Culture  Setup Time   Final    GRAM POSITIVE COCCI IN CLUSTERS AEROBIC BOTTLE ONLY CRITICAL RESULT CALLED TO, READ BACK BY AND VERIFIED WITH: A.Ricki Miller [id number] 10/27/14 M.Raykwon Hobbs GRAM STAIN REVIEWED-AGREE WITH RESULT S. YARBROUGH    Culture GRAM POSITIVE COCCI  Final   Report Status PENDING  Incomplete  Body fluid culture     Status: None (Preliminary result)   Collection Time: 10/26/14  5:49 PM  Result Value Ref Range Status   Specimen Description SYNOVIAL LEFT KNEE  Final   Special Requests FLUID ON A SWAB  Final   Gram Stain   Final    FEW WBC PRESENT,BOTH PMN AND MONONUCLEAR NO ORGANISMS SEEN    Culture   Final    STAPHYLOCOCCUS AUREUS SUSCEPTIBILITIES PERFORMED ON PREVIOUS CULTURE WITHIN THE LAST 5 DAYS.    Report Status PENDING  Incomplete  Anaerobic culture     Status: None (Preliminary result)   Collection Time: 10/26/14  5:49 PM  Result Value Ref Range Status   Specimen Description SYNOVIAL LEFT KNEE  Final   Special Requests A FLUID ON SWAB  Final   Gram Stain   Final    FEW WBC PRESENT,BOTH PMN AND MONONUCLEAR NO ORGANISMS SEEN    Culture   Final    NO ANAEROBES ISOLATED; CULTURE IN PROGRESS FOR 5 DAYS   Report Status PENDING  Incomplete  Fungus Culture with Smear     Status: None (Preliminary result)   Collection Time: 10/26/14  5:49 PM  Result Value Ref Range Status   Specimen Description SYNOVIAL LEFT KNEE  Final   Special Requests A FLUID ON SWAB  Final   Fungal Smear   Final    NO YEAST OR FUNGAL ELEMENTS SEEN Performed at [facility]    Culture   Final    CULTURE IN PROGRESS FOR FOUR WEEKS Performed at [facility]    Report Status PENDING  Incomplete  AFB culture with smear      Status: None (Preliminary result)   Collection Time: 10/26/14  5:49 PM  Result Value Ref Range Status   Specimen Description SYNOVIAL LEFT KNEE  Final   Special Requests A FLUID  ON SWAB  Final   Acid Fast Smear   Final    NO ACID FAST BACILLI SEEN Performed at [facility]    Culture   Final    CULTURE WILL BE EXAMINED FOR 6 WEEKS BEFORE ISSUING A FINAL REPORT Performed at [facility]    Report Status PENDING  Incomplete  Anaerobic culture     Status: None (Preliminary result)   Collection Time: 10/26/14  5:51 PM  Result Value Ref Range Status   Specimen Description SYNOVIAL RIGHT KNEE  Final   Special Requests B FLUID ON SWAB  Final   Gram Stain   Final    FEW WBC PRESENT,BOTH PMN AND MONONUCLEAR NO ORGANISMS SEEN    Culture   Final    NO ANAEROBES ISOLATED; CULTURE IN PROGRESS FOR 5 DAYS   Report Status PENDING  Incomplete  Body fluid culture     Status: None (Preliminary result)   Collection Time: 10/26/14  5:51 PM  Result Value Ref Range Status   Specimen Description SYNOVIAL RIGHT KNEE  Final   Special Requests B FLUID ON SWAB  Final   Gram Stain   Final    FEW WBC PRESENT,BOTH PMN AND MONONUCLEAR NO ORGANISMS SEEN    Culture NO GROWTH 3 DAYS  Final   Report Status PENDING  Incomplete  Fungus Culture with Smear     Status: None (Preliminary result)   Collection Time: 10/26/14  5:51 PM  Result Value Ref Range Status   Specimen Description SYNOVIAL RIGHT KNEE  Final   Special Requests B FLUID ON SWAB  Final   Fungal Smear   Final    NO YEAST OR FUNGAL ELEMENTS SEEN Performed at [facility]    Culture   Final    CULTURE IN PROGRESS FOR FOUR WEEKS Performed at [facility]    Report Status PENDING  Incomplete  AFB culture with smear     Status: None (Preliminary result)   Collection Time: 10/26/14  5:51 PM  Result Value Ref Range Status   Specimen Description SYNOVIAL RIGHT KNEE  Final   Special Requests B FLUID ON SWAB  Final     Acid Fast Smear   Final    NO ACID FAST BACILLI SEEN Performed at [facility]    Culture   Final    CULTURE WILL BE EXAMINED FOR 6 WEEKS BEFORE ISSUING A FINAL REPORT Performed at [facility]    Report Status PENDING  Incomplete  AFB culture with smear     Status: None (Preliminary result)   Collection Time: 10/26/14  5:51 PM  Result Value Ref Range Status   Specimen Description TISSUE RIGHT KNEE  Final   Special Requests D PT ON ZOSYN VANCOMYCIN  Final   Acid Fast Smear   Final    NO ACID FAST BACILLI SEEN Performed at [facility]    Culture   Final    CULTURE WILL BE EXAMINED FOR 6 WEEKS BEFORE ISSUING A FINAL REPORT Performed at [facility]    Report Status PENDING  Incomplete  Anaerobic culture     Status: None (Preliminary result)   Collection Time: 10/26/14  5:55 PM  Result Value Ref Range Status   Specimen Description TISSUE LEFT KNEE  Final   Special Requests C  Final   Gram Stain   Final    NO WBC SEEN FEW GRAM POSITIVE COCCI IN PAIRS FEW GRAM POSITIVE RODS Performed at [facility]    Culture   Final    NO ANAEROBES ISOLATED; CULTURE IN PROGRESS FOR 5 DAYS Performed at [facility]    Report Status PENDING  Incomplete  Tissue culture     Status: None (Preliminary result)   Collection Time: 10/26/14  5:55 PM  Result Value Ref Range Status   Specimen Description TISSUE LEFT KNEE  Final   Special Requests C  Final   Gram Stain   Final    NO WBC SEEN FEW GRAM POSITIVE COCCI IN PAIRS FEW GRAM POSITIVE RODS Performed at [facility]    Culture   Final    ABUNDANT STAPHYLOCOCCUS AUREUS Note: RIFAMPIN AND GENTAMICIN SHOULD NOT BE USED AS SINGLE DRUGS FOR TREATMENT OF STAPH INFECTIONS. Performed at [facility]    Report Status PENDING  Incomplete  Fungus Culture with Smear     Status: None (Preliminary result)   Collection Time: 10/26/14  5:55 PM  Result Value Ref Range Status    Specimen Description TISSUE LEFT KNEE  Final   Special Requests C  Final   Fungal Smear   Final    NO YEAST OR FUNGAL ELEMENTS SEEN Performed at [facility]    Culture   Final    CULTURE IN PROGRESS FOR FOUR WEEKS Performed at [facility]    Report Status PENDING  Incomplete  AFB culture with smear     Status: None (Preliminary result)   Collection Time: 10/26/14  5:55 PM  Result Value Ref Range Status   Specimen Description TISSUE LEFT KNEE  Final   Special Requests C  Final   Acid Fast Smear   Final    NO ACID FAST BACILLI SEEN Performed at [facility]    Culture   Final    CULTURE WILL BE EXAMINED FOR 6 WEEKS BEFORE ISSUING A FINAL REPORT Performed at [facility]    Report Status PENDING  Incomplete  Anaerobic culture     Status: None (Preliminary result)   Collection Time: 10/26/14  5:57 PM  Result Value Ref Range Status   Specimen Description TISSUE RIGHT KNEE  Final   Special Requests D PT ON ZOSYN AND VANCOMYCIN  Final   Gram Stain PENDING  Incomplete   Culture   Final    NO ANAEROBES ISOLATED; CULTURE IN PROGRESS FOR 5 DAYS Performed at [facility]    Report Status PENDING  Incomplete  Fungus Culture with Smear     Status: None (Preliminary result)   Collection Time: 10/26/14  5:57 PM  Result Value Ref Range Status   Specimen Description TISSUE RIGHT KNEE  Final   Special Requests D PT ON ZOSYN VANCOMYCIN  Final   Fungal Smear   Final    NO YEAST OR FUNGAL ELEMENTS SEEN Performed at [facility]    Culture   Final    CULTURE IN PROGRESS FOR FOUR WEEKS Performed at [facility]    Report Status PENDING  Incomplete  Tissue culture     Status: None (Preliminary result)   Collection Time: 10/26/14  5:57 PM  Result Value Ref Range Status   Specimen Description TISSUE RIGHT KNEE  Final   Special Requests D PT ON ZOSYN VANCOMYCIN  Final   Gram Stain   Final    NO WBC SEEN NO ORGANISMS  SEEN Performed at [facility]    Culture   Final    FEW PSEUDOMONAS AERUGINOSA MODERATE  STAPHYLOCOCCUS AUREUS Note: RIFAMPIN AND GENTAMICIN SHOULD NOT BE USED AS SINGLE DRUGS FOR TREATMENT OF STAPH INFECTIONS. Performed at [facility]    Report Status PENDING  Incomplete   Organism ID, Bacteria PSEUDOMONAS AERUGINOSA  Final      Susceptibility   Pseudomonas aeruginosa - MIC*    CEFEPIME <=1 SENSITIVE Sensitive     CEFTAZIDIME 2 SENSITIVE Sensitive     CIPROFLOXACIN <=0.25 SENSITIVE Sensitive     GENTAMICIN <=1 SENSITIVE Sensitive     IMIPENEM 2 SENSITIVE Sensitive     PIP/TAZO <=4 SENSITIVE Sensitive     TOBRAMYCIN <=1 SENSITIVE Sensitive     * FEW PSEUDOMONAS AERUGINOSA  Surgical pcr screen     Status: Abnormal   Collection Time: 10/29/14  6:25 AM  Result Value Ref Range Status   MRSA, PCR NEGATIVE NEGATIVE Final   Staphylococcus aureus POSITIVE (A) NEGATIVE Final    Comment:        The Xpert SA Assay (FDA approved for NASAL specimens in patients over 64 years of age), is one component of a comprehensive surveillance program.  Test performance has been validated by [facility] for patients greater than or equal to 242 year old. It is not intended to diagnose infection nor to guide or monitor treatment.     Assessment: Timothy Norman has MSSA bacteremia complicating polymicrobial infection of his thighs and knees. Left knee culture has grown staph aureus and right knee culture has grown Pseudomonas and staph aureus. I will continue piperacillin tazobactam alone for now.  Plan: 1. Continue piperacillin tazobactam; discontinue vancomycin 2. Await results of repeat blood cultures 3. Hold off on PICC placement until blood cultures are negative 4. TTE 5. Please call me for any infectious disease questions this weekend  Cliffton AstersJohn Chaelyn Bunyan, MD [facility] [phone] pager   [phone] cell 10/29/2014, 5:05 PM

## 2014-10-29 NOTE — Anesthesia Preprocedure Evaluation (Addendum)
Anesthesia Evaluation  Patient identified by MRN, date of birth, ID band Patient awake    Reviewed: Allergy & Precautions, NPO status , Patient's Chart, lab work & pertinent test results  Airway Mallampati: II  TM Distance: >3 FB Neck ROM: Full    Dental  (+) Poor Dentition, Dental Advisory Given   Pulmonary Current Smoker,  breath sounds clear to auscultation        Cardiovascular negative cardio ROS  Rhythm:Regular Rate:Normal     Neuro/Psych negative neurological ROS  negative psych ROS   GI/Hepatic (+)     substance abuse  alcohol use,   Endo/Other    Renal/GU Renal hypertensionRenal disease     Musculoskeletal   Abdominal   Peds  Hematology   Anesthesia Other Findings   Reproductive/Obstetrics                           Anesthesia Physical  Anesthesia Plan  ASA: III  Anesthesia Plan: General   Post-op Pain Management:    Induction: Intravenous  Airway Management Planned: Oral ETT  Additional Equipment:   Intra-op Plan:   Post-operative Plan: Extubation in OR  Informed Consent: I have reviewed the patients History and Physical, chart, labs and discussed the procedure including the risks, benefits and alternatives for the proposed anesthesia with the patient or authorized representative who has indicated his/her understanding and acceptance.   Dental advisory given  Plan Discussed with: CRNA, Anesthesiologist and Surgeon  Anesthesia Plan Comments:         Anesthesia Quick Evaluation                                  Anesthesia Evaluation   Patient awake    Reviewed: Allergy & Precautions, NPO status , Patient's Chart, lab work & pertinent test results  Airway Mallampati: II  TM Distance: >3 FB Neck ROM: Full    Dental  (+) Poor Dentition, Dental Advisory Given   Pulmonary Current Smoker,  breath sounds clear to auscultation         Cardiovascular Rhythm:Regular Rate:Normal     Neuro/Psych    GI/Hepatic   Endo/Other    Renal/GU      Musculoskeletal   Abdominal   Peds  Hematology   Anesthesia Other Findings   Reproductive/Obstetrics                            Anesthesia Physical Anesthesia Plan  ASA: III  Anesthesia Plan: General   Post-op Pain Management:    Induction: Intravenous  Airway Management Planned: Oral ETT  Additional Equipment:   Intra-op Plan:   Post-operative Plan: Extubation in OR  Informed Consent: I have reviewed the patients History and Physical, chart, labs and discussed the procedure including the risks, benefits and alternatives for the proposed anesthesia with the patient or authorized representative who has indicated his/her understanding and acceptance.   Dental advisory given  Plan Discussed with: CRNA, Anesthesiologist and Surgeon  Anesthesia Plan Comments:         Anesthesia Quick Evaluation

## 2014-10-29 NOTE — Brief Op Note (Signed)
   Brief Op Note  Date of Surgery: 10/29/2014  Preoperative Diagnosis: bilateral knee wounds  Postoperative Diagnosis: same  Procedure: Procedure(s): IRRIGATION AND DEBRIDEMENT BILATERAL KNEES WITH VAC CHANGE  Implants: none  Surgeons: Surgeon(s): Naiping Donnelly StagerM Xu, MD  Anesthesia: General  Drains: 2 wound VACs  Estimated Blood Loss: See anesthesia record  Complications: None  Condition to PACU: Stable  Naiping Glee ArvinMichael Xu, MD [facility] 10/29/2014 12:59 PM

## 2014-10-29 NOTE — Progress Notes (Signed)
ANTIBIOTIC CONSULT NOTE - FOLLOW UP  Pharmacy Consult for Vancomycin Indication: Cellulitis  No Known Allergies  Patient Measurements: Height: 5\' 6"  (167.6 cm) Weight: 151 lb 0.2 oz (68.5 kg) IBW/kg (Calculated) : 63.8   Vital Signs: Temp: 98.6 F (37 C) (07/15 0613) Temp Source: Oral (07/15 1610) BP: 106/53 mmHg (07/15 9604) Pulse Rate: 93 (07/15 0613) Intake/Output from previous day: 07/14 0701 - 07/15 0700 In: 1390 [P.O.:240; I.V.:900; IV Piggyback:250] Out: 400 [Urine:400] Intake/Output from this shift:    Labs:  Recent Labs  10/26/14 1230 10/27/14 0550 10/27/14 1840  WBC 16.2* 24.3* 14.3*  HGB 14.6 9.1* 8.6*  PLT 382 463* 406*  CREATININE 1.44* 1.84*  --    Estimated Creatinine Clearance: 36.6 mL/min (by C-G formula based on Cr of 1.84). No results for input(s): VANCOTROUGH, VANCOPEAK, VANCORANDOM, GENTTROUGH, GENTPEAK, GENTRANDOM, TOBRATROUGH, TOBRAPEAK, TOBRARND, AMIKACINPEAK, AMIKACINTROU, AMIKACIN in the last 72 hours.   Microbiology: Recent Results (from the past 720 hour(s))  Blood culture (routine x 2)     Status: None (Preliminary result)   Collection Time: 10/26/14 12:30 PM  Result Value Ref Range Status   Specimen Description BLOOD RIGHT ARM  Final   Special Requests BOTTLES DRAWN AEROBIC AND ANAEROBIC 5CC  Final   Culture  Setup Time   Final    GRAM POSITIVE COCCI IN CLUSTERS IN BOTH AEROBIC AND ANAEROBIC BOTTLES CRITICAL RESULT CALLED TO, READ BACK BY AND VERIFIED WITH: A.Ricki Miller [id number] 10/27/14 M.CAMPBELL GRAM STAIN REVIEWED-AGREE WITH RESULT S. YARBROUGH    Culture   Final    STAPHYLOCOCCUS AUREUS STAPHYLOCOCCUS SPECIES (COAGULASE NEGATIVE)    Report Status PENDING  Incomplete   Organism ID, Bacteria STAPHYLOCOCCUS AUREUS  Final      Susceptibility   Staphylococcus aureus - MIC*    CIPROFLOXACIN <=0.5 SENSITIVE Sensitive     ERYTHROMYCIN <=0.25 SENSITIVE Sensitive     GENTAMICIN <=0.5 SENSITIVE Sensitive     OXACILLIN 0.5 SENSITIVE  Sensitive     TETRACYCLINE <=1 SENSITIVE Sensitive     VANCOMYCIN <=0.5 SENSITIVE Sensitive     TRIMETH/SULFA <=10 SENSITIVE Sensitive     CLINDAMYCIN <=0.25 SENSITIVE Sensitive     RIFAMPIN <=0.5 SENSITIVE Sensitive     Inducible Clindamycin NEGATIVE Sensitive     * STAPHYLOCOCCUS AUREUS  Blood culture (routine x 2)     Status: None (Preliminary result)   Collection Time: 10/26/14 12:30 PM  Result Value Ref Range Status   Specimen Description BLOOD LEFT ARM  Final   Special Requests BOTTLES DRAWN AEROBIC AND ANAEROBIC 5CC  Final   Culture  Setup Time   Final    GRAM POSITIVE COCCI IN CLUSTERS AEROBIC BOTTLE ONLY CRITICAL RESULT CALLED TO, READ BACK BY AND VERIFIED WITH: A.Ricki Miller [id number] 10/27/14 M.CAMPBELL GRAM STAIN REVIEWED-AGREE WITH RESULT S. YARBROUGH    Culture GRAM POSITIVE COCCI  Final   Report Status PENDING  Incomplete  Body fluid culture     Status: None (Preliminary result)   Collection Time: 10/26/14  5:49 PM  Result Value Ref Range Status   Specimen Description SYNOVIAL LEFT KNEE  Final   Special Requests FLUID ON A SWAB  Final   Gram Stain   Final    FEW WBC PRESENT,BOTH PMN AND MONONUCLEAR NO ORGANISMS SEEN    Culture CULTURE REINCUBATED FOR BETTER GROWTH  Final   Report Status PENDING  Incomplete  Anaerobic culture     Status: None (Preliminary result)   Collection Time: 10/26/14  5:49 PM  Result Value Ref  Range Status   Specimen Description SYNOVIAL LEFT KNEE  Final   Special Requests A FLUID ON SWAB  Final   Gram Stain   Final    FEW WBC PRESENT,BOTH PMN AND MONONUCLEAR NO ORGANISMS SEEN    Culture   Final    NO ANAEROBES ISOLATED; CULTURE IN PROGRESS FOR 5 DAYS   Report Status PENDING  Incomplete  Fungus Culture with Smear     Status: None (Preliminary result)   Collection Time: 10/26/14  5:49 PM  Result Value Ref Range Status   Specimen Description SYNOVIAL LEFT KNEE  Final   Special Requests A FLUID ON SWAB  Final   Fungal Smear   Final    NO  YEAST OR FUNGAL ELEMENTS SEEN Performed at [facility]    Culture   Final    CULTURE IN PROGRESS FOR FOUR WEEKS Performed at [facility]    Report Status PENDING  Incomplete  [place] culture with smear     Status: None (Preliminary result)   Collection Time: 10/26/14  5:49 PM  Result Value Ref Range Status   Specimen Description SYNOVIAL LEFT KNEE  Final   Special Requests A FLUID ON SWAB  Final   Acid Fast Smear   Final    NO ACID FAST BACILLI SEEN Performed at [facility]    Culture   Final    CULTURE WILL BE EXAMINED FOR 6 WEEKS BEFORE ISSUING A FINAL REPORT Performed at [facility]    Report Status PENDING  Incomplete  Anaerobic culture     Status: None (Preliminary result)   Collection Time: 10/26/14  5:51 PM  Result Value Ref Range Status   Specimen Description SYNOVIAL RIGHT KNEE  Final   Special Requests B FLUID ON SWAB  Final   Gram Stain   Final    FEW WBC PRESENT,BOTH PMN AND MONONUCLEAR NO ORGANISMS SEEN    Culture   Final    NO ANAEROBES ISOLATED; CULTURE IN PROGRESS FOR 5 DAYS   Report Status PENDING  Incomplete  Body fluid culture     Status: None (Preliminary result)   Collection Time: 10/26/14  5:51 PM  Result Value Ref Range Status   Specimen Description SYNOVIAL RIGHT KNEE  Final   Special Requests B FLUID ON SWAB  Final   Gram Stain   Final    FEW WBC PRESENT,BOTH PMN AND MONONUCLEAR NO ORGANISMS SEEN    Culture NO GROWTH 2 DAYS  Final   Report Status PENDING  Incomplete  Fungus Culture with Smear     Status: None (Preliminary result)   Collection Time: 10/26/14  5:51 PM  Result Value Ref Range Status   Specimen Description SYNOVIAL RIGHT KNEE  Final   Special Requests B FLUID ON SWAB  Final   Fungal Smear   Final    NO YEAST OR FUNGAL ELEMENTS SEEN Performed at [facility]    Culture   Final    CULTURE IN PROGRESS FOR FOUR WEEKS Performed at [facility]    Report Status PENDING  Incomplete   [place] culture with smear     Status: None (Preliminary result)   Collection Time: 10/26/14  5:51 PM  Result Value Ref Range Status   Specimen Description SYNOVIAL RIGHT KNEE  Final   Special Requests B FLUID ON SWAB  Final   Acid Fast Smear   Final    NO ACID FAST BACILLI SEEN Performed at [facility]  Culture   Final    CULTURE WILL BE EXAMINED FOR 6 WEEKS BEFORE ISSUING A FINAL REPORT Performed at [facility]    Report Status PENDING  Incomplete  [place] culture with smear     Status: None (Preliminary result)   Collection Time: 10/26/14  5:51 PM  Result Value Ref Range Status   Specimen Description TISSUE RIGHT KNEE  Final   Special Requests D PT ON ZOSYN VANCOMYCIN  Final   Acid Fast Smear   Final    NO ACID FAST BACILLI SEEN Performed at [facility]    Culture   Final    CULTURE WILL BE EXAMINED FOR 6 WEEKS BEFORE ISSUING A FINAL REPORT Performed at [facility]    Report Status PENDING  Incomplete  Anaerobic culture     Status: None (Preliminary result)   Collection Time: 10/26/14  5:55 PM  Result Value Ref Range Status   Specimen Description TISSUE LEFT KNEE  Final   Special Requests C  Final   Gram Stain   Final    NO WBC SEEN FEW GRAM POSITIVE COCCI IN PAIRS FEW GRAM POSITIVE RODS Performed at [facility]    Culture   Final    NO ANAEROBES ISOLATED; CULTURE IN PROGRESS FOR 5 DAYS Performed at [facility]    Report Status PENDING  Incomplete  Tissue culture     Status: None (Preliminary result)   Collection Time: 10/26/14  5:55 PM  Result Value Ref Range Status   Specimen Description TISSUE LEFT KNEE  Final   Special Requests C  Final   Gram Stain   Final    NO WBC SEEN FEW GRAM POSITIVE COCCI IN PAIRS FEW GRAM POSITIVE RODS Performed at [facility]    Culture   Final    ABUNDANT STAPHYLOCOCCUS AUREUS Note: RIFAMPIN AND GENTAMICIN SHOULD NOT BE USED AS SINGLE DRUGS FOR TREATMENT OF STAPH  INFECTIONS. Performed at [facility]    Report Status PENDING  Incomplete  Fungus Culture with Smear     Status: None (Preliminary result)   Collection Time: 10/26/14  5:55 PM  Result Value Ref Range Status   Specimen Description TISSUE LEFT KNEE  Final   Special Requests C  Final   Fungal Smear   Final    NO YEAST OR FUNGAL ELEMENTS SEEN Performed at [facility]    Culture   Final    CULTURE IN PROGRESS FOR FOUR WEEKS Performed at [facility]    Report Status PENDING  Incomplete  [place] culture with smear     Status: None (Preliminary result)   Collection Time: 10/26/14  5:55 PM  Result Value Ref Range Status   Specimen Description TISSUE LEFT KNEE  Final   Special Requests C  Final   Acid Fast Smear   Final    NO ACID FAST BACILLI SEEN Performed at [facility]    Culture   Final    CULTURE WILL BE EXAMINED FOR 6 WEEKS BEFORE ISSUING A FINAL REPORT Performed at [facility]    Report Status PENDING  Incomplete  Anaerobic culture     Status: None (Preliminary result)   Collection Time: 10/26/14  5:57 PM  Result Value Ref Range Status   Specimen Description TISSUE RIGHT KNEE  Final   Special Requests D PT ON ZOSYN AND VANCOMYCIN  Final   Gram Stain PENDING  Incomplete   Culture   Final  NO ANAEROBES ISOLATED; CULTURE IN PROGRESS FOR 5 DAYS [facility]    Report Status PENDING  Incomplete  Fungus Culture with Smear     Status: None (Preliminary result)   Collection Time: 10/26/14  5:57 PM  Result Value Ref Range Status   Specimen Description TISSUE RIGHT KNEE  Final   Special Requests D PT ON ZOSYN VANCOMYCIN  Final   Fungal Smear   Final    NO YEAST OR FUNGAL ELEMENTS SEEN [facility]    Culture   Final    CULTURE IN PROGRESS FOR FOUR WEEKS [facility]    Report Status PENDING  Incomplete  Tissue culture     Status: None (Preliminary result)    Collection Time: 10/26/14  5:57 PM  Result Value Ref Range Status   Specimen Description TISSUE RIGHT KNEE  Final   Special Requests D PT ON ZOSYN VANCOMYCIN  Final   Gram Stain   Final    NO WBC SEEN NO ORGANISMS SEEN [facility]    Culture   Final    FEW PSEUDOMONAS AERUGINOSA MODERATE STAPHYLOCOCCUS AUREUS Note: RIFAMPIN AND GENTAMICIN SHOULD NOT BE USED AS SINGLE DRUGS FOR TREATMENT OF STAPH INFECTIONS. Performed at [facility]    Report Status PENDING  Incomplete   Organism ID, Bacteria PSEUDOMONAS AERUGINOSA  Final      Susceptibility   Pseudomonas aeruginosa - MIC*    CEFEPIME <=1 SENSITIVE Sensitive     CEFTAZIDIME 2 SENSITIVE Sensitive     CIPROFLOXACIN <=0.25 SENSITIVE Sensitive     GENTAMICIN <=1 SENSITIVE Sensitive     IMIPENEM 2 SENSITIVE Sensitive     PIP/TAZO <=4 SENSITIVE Sensitive     TOBRAMYCIN <=1 SENSITIVE Sensitive     * FEW PSEUDOMONAS AERUGINOSA  Surgical pcr screen     Status: Abnormal   Collection Time: 10/29/14  6:25 AM  Result Value Ref Range Status   MRSA, PCR NEGATIVE NEGATIVE Final   Staphylococcus aureus POSITIVE (A) NEGATIVE Final    Comment:        The Xpert SA Assay (FDA approved for NASAL specimens in patients over 64 years of age), is one component of a comprehensive surveillance program.  Test performance has been validated by [facility] for patients greater than or equal to 64 year old. It is not intended to diagnose infection nor to guide or monitor treatment.     Anti-infectives    Start     Dose/Rate Route Frequency Ordered Stop   10/27/14 1400  vancomycin (VANCOCIN) IVPB 1000 mg/200 mL premix     1,000 mg 200 mL/hr over 60 Minutes Intravenous Every 24 hours 10/26/14 1355     10/26/14 2030  piperacillin-tazobactam (ZOSYN) IVPB 3.375 g     3.375 g 12.5 mL/hr over 240 Minutes Intravenous Every 8 hours 10/26/14 1719     10/26/14 1926  polymyxin B 500,000 Units, bacitracin 50,000 Units in  sodium chloride irrigation 0.9 % 500 mL irrigation  Status:  Discontinued       As needed 10/26/14 1926 10/26/14 1929   10/26/14 1300  vancomycin (VANCOCIN) 1,250 mg in sodium chloride 0.9 % 250 mL IVPB     1,250 mg 166.7 mL/hr over 90 Minutes Intravenous  Once 10/26/14 1250 10/26/14 1454   10/26/14 1230  piperacillin-tazobactam (ZOSYN) IVPB 3.375 g     3.375 g 100 mL/hr over 30 Minutes Intravenous  Once 10/26/14 1216 10/26/14 1302  Assessment: 64 yo male presenting 7/12 with necrotic would to bilateral lower extremities. Receiving vancomycin/zosyn for cellulitis. Scheduled for repeat  I&D today. Wbc Norman, Timothy Norman, Timothy Norman, Timothy Norman 7/12 fungal cx-->ngtd 7/12 anaerobic cx-->ngtd 7/12 tissue - pseudmonas (pan-sensitive) 7/12 synovial fluid - negd 7/15 mrsa pcr: pos  Goal of Therapy:  Vancomycin trough level 10-15 mcg/ml  Eradicate infection  Plan:  -Continue Vancomycin 1000 mg every 24 hours  -Continue Zosyn 3.375 gm every 8 hours -Follow C&S, deescalate abx as necessary -Schedule VT when appropriate   Elige Ko, PharmD, BCPS 10/29/2014,10:58 AM

## 2014-10-29 NOTE — Anesthesia Procedure Notes (Signed)
Procedure Name: Intubation Date/Time: 10/29/2014 12:15 PM Performed by: Marylyn IshiharaFURLOW, Ariel Wingrove Pre-anesthesia Checklist: Patient identified, Timeout performed, Emergency Drugs available, Suction available and Patient being monitored Patient Re-evaluated:Patient Re-evaluated prior to inductionOxygen Delivery Method: Circle system utilized Preoxygenation: Pre-oxygenation with 100% oxygen Intubation Type: IV induction Ventilation: Mask ventilation without difficulty Laryngoscope Size: Mac and 3 Grade View: Grade I Tube type: Oral Tube size: 7.5 mm Number of attempts: 1 Airway Equipment and Method: Stylet Placement Confirmation: ETT inserted through vocal cords under direct vision,  breath sounds checked- equal and bilateral and positive ETCO2 Secured at: 22 cm Tube secured with: Tape Dental Injury: Teeth and Oropharynx as per pre-operative assessment

## 2014-10-29 NOTE — Progress Notes (Signed)
Report given to OR nurse . Pt resting in bed, NPO since 0001 10/29/14. Vitals Stable. Prop MRSA negative. 10/29/2014 10:38 AM Timothy Norman

## 2014-10-29 NOTE — Anesthesia Postprocedure Evaluation (Signed)
Anesthesia Post Note  Patient: Timothy Norman  Procedure(s) Performed: Procedure(s) (LRB): IRRIGATION AND DEBRIDEMENT BILATERAL KNEES WITH VAC CHANGE (Bilateral)  Anesthesia type: general  Patient location: PACU  Post pain: Pain level controlled  Post assessment: Patient's Cardiovascular Status Stable  Last Vitals:  Filed Vitals:   10/29/14 1347  BP: 154/90  Pulse: 104  Temp:   Resp: 19    Post vital signs: Reviewed and stable  Level of consciousness: sedated  Complications: No apparent anesthesia complications

## 2014-10-29 NOTE — Transfer of Care (Signed)
Immediate Anesthesia Transfer of Care Note  Patient: Timothy Norman  Procedure(s) Performed: Procedure(s): IRRIGATION AND DEBRIDEMENT BILATERAL KNEES WITH VAC CHANGE (Bilateral)  Patient Location: PACU  Anesthesia Type:General  Level of Consciousness: awake, alert , pateint uncooperative and responds to stimulation  Airway & Oxygen Therapy: Patient Spontanous Breathing and Patient connected to nasal cannula oxygen  Post-op Assessment: Report given to RN, Patient moving all extremities X 4 and Patient able to stick tongue midline  Post vital signs: stable  Last Vitals:  Filed Vitals:   10/29/14 0613  BP: 106/53  Pulse: 93  Temp: 37 C  Resp: 18    Complications: No apparent anesthesia complications

## 2014-10-29 NOTE — H&P (Signed)
H&P update  The surgical history has been reviewed and remains accurate without interval change.  The patient was re-examined and patient's physiologic condition has not changed significantly in the last 30 days. The condition still exists that makes this procedure necessary. The treatment plan remains the same, without new options for care.  No new pharmacological allergies or types of therapy has been initiated that would change the plan or the appropriateness of the plan.  The patient and/or family understand the potential benefits and risks.  Mayra ReelN. Michael Xu, MD 10/29/2014 11:50 AM

## 2014-10-30 ENCOUNTER — Inpatient Hospital Stay (HOSPITAL_COMMUNITY): Payer: Medicare Other

## 2014-10-30 DIAGNOSIS — I340 Nonrheumatic mitral (valve) insufficiency: Secondary | ICD-10-CM

## 2014-10-30 LAB — TISSUE CULTURE
GRAM STAIN: NONE SEEN
GRAM STAIN: NONE SEEN

## 2014-10-30 LAB — BODY FLUID CULTURE: Culture: NO GROWTH

## 2014-10-30 MED ORDER — DIPHENHYDRAMINE HCL 25 MG PO CAPS
25.0000 mg | ORAL_CAPSULE | Freq: Four times a day (QID) | ORAL | Status: DC | PRN
  Administered 2014-10-30 – 2014-11-03 (×3): 25 mg via ORAL
  Filled 2014-10-30 (×3): qty 1

## 2014-10-30 NOTE — Progress Notes (Signed)
Still awaiting to hear about vac approval.  This has not been approved yet.

## 2014-10-30 NOTE — Progress Notes (Signed)
  Echocardiogram 2D Echocardiogram has been performed.  Arvil ChacoFoster, Arnetra Terris 10/30/2014, 1:10 PM

## 2014-10-30 NOTE — Social Work (Signed)
[facility] CSW spoke with family member, JB, [phone], who stated that he was in agreement [facility].  CSW will continue to follow.  Beverly Sessionsywan J Amey Hossain MSW, LCSW [phone]

## 2014-10-30 NOTE — Progress Notes (Signed)
Family Medicine Teaching Service Daily Progress Note Intern Pager: [phone]  Patient name: Timothy Norman Medical record number: [id number] Date of birth: 27-Jan-1951 Age: 64 y.o. Gender: male  Primary Care Provider: No primary care provider on file. Consultants: Ortho, ID Code Status: FuLL  Pt Overview and Major Events to Date:  07/12: Admitted to floor, Ortho debrided legs in OR 07/13: Wound VAC in place BL LE, ID consult  Assessment and Plan: Timothy Norman is a 64 y.o. male presenting with severe bilateral necrotic leg wounds. PMH is significant for alcohol abuse.   Bilateral Necrotic Leb Wounds: Patient reports that wound started after a fall the week prior to admission, however appeared to be infected for months and maggots were removed from the wounds upon presentation to the ED. Patient is now s/p significant debridement and bilateral knee washout. Blood cultures have grown MSSA and wound cultures have grown pseudomonas.  -Orthopedics consulted, appreciate assistance -Vanc (7/12-7/14), Zosyn (7/12- ) -PT/OT consulted, recommend SNF placement -Pain control with tylenol and IV morphine 4mg  q4hrs prn  Bacteremia; 2/2 Blood cultures positive for MSSA. HIV, RPR, hepatitis panel negative.  -ID consulted, appreciate assistance -Abx as above -Echo pending -Repeat blood cultures (7/15) pending.   AMS/Cognitive Impairment. Patient noted to have strange abnormal behavior in the ED. Ethanol negative. UDS only positive for benzos and opiates which patient received in ED. LFTs unremarkable. Psychiatric history unknown, possible schizophrenia.  Friend JB reports that this is the patient's baseline mental status. HEad CT noted to have mild bilateral frontal encephalomalacia.  -Monitor mental status -Call family member to get more information if needed, name is [name] (cousin): [phone]  Alcohol Abuse; Chronic -consult social work -CIWA protocol - Thiamine, folate, multivitamin.    Malnutrition. Albumin 2.9 on admission. - Nutrition consulted, appreciate assistance.   FEN/GI: [facility], regular PPx: Heparin  Disposition: Admitted to med-surg floor pending above work up. Anticipate discharge to SNF when medically stable.   Subjective:  Patient doing well this morning. Wishes to walk around outside, however per nursing staff required 2 people to assist moving him from bed to chair. No fevers or chills. No chest pain or shortness of breath.   Objective: Temp:  [97.2 F (36.2 C)-98.2 F (36.8 C)] 97.6 F (36.4 C) (07/16 0722) Pulse Rate:  [95-107] 103 (07/16 0722) Resp:  [16-21] 16 (07/16 0722) BP: (118-158)/(55-92) 147/84 mmHg (07/16 0722) SpO2:  [93 %-100 %] 100 % (07/16 0722) Physical Exam: General: NAD, sitting in bedside chair.  Cardiovascular: RRR, no murmurs Respiratory: clear to auscultation bilaterally, no increased WOB Abdomen: (+) BS, nontender, nondistended Extremities: wound vac in place in bilateral lower extremities, draining serosanginous fluid. 2+ pulses bilateral dorsalis pedis  Laboratory/Micro/Diagnostic Studies: Recent Results (from the past 240 hour(s))  Blood culture (routine x 2)     Status: None (Preliminary result)   Collection Time: 10/26/14 12:30 PM  Result Value Ref Range Status   Specimen Description BLOOD RIGHT ARM  Final   Special Requests BOTTLES DRAWN AEROBIC AND ANAEROBIC 5CC  Final   Culture  Setup Time   Final    GRAM POSITIVE COCCI IN CLUSTERS IN BOTH AEROBIC AND ANAEROBIC BOTTLES CRITICAL RESULT CALLED TO, READ BACK BY AND VERIFIED WITH: A.Ricki Miller [id number] 10/27/14 M.CAMPBELL GRAM STAIN REVIEWED-AGREE WITH RESULT Lucienne Capers    Culture   Final    STAPHYLOCOCCUS AUREUS STAPHYLOCOCCUS SPECIES (COAGULASE NEGATIVE)    Report Status PENDING  Incomplete   Organism ID, Bacteria STAPHYLOCOCCUS AUREUS  Final   Organism  ID, Bacteria STAPHYLOCOCCUS SPECIES (COAGULASE NEGATIVE)  Final      Susceptibility   Staphylococcus  aureus - MIC*    CIPROFLOXACIN <=0.5 SENSITIVE Sensitive     ERYTHROMYCIN <=0.25 SENSITIVE Sensitive     GENTAMICIN <=0.5 SENSITIVE Sensitive     OXACILLIN 0.5 SENSITIVE Sensitive     TETRACYCLINE <=1 SENSITIVE Sensitive     VANCOMYCIN <=0.5 SENSITIVE Sensitive     TRIMETH/SULFA <=10 SENSITIVE Sensitive     CLINDAMYCIN <=0.25 SENSITIVE Sensitive     RIFAMPIN <=0.5 SENSITIVE Sensitive     Inducible Clindamycin NEGATIVE Sensitive     * STAPHYLOCOCCUS AUREUS   Staphylococcus species (coagulase negative) - MIC*    CIPROFLOXACIN <=0.5 SENSITIVE Sensitive     ERYTHROMYCIN <=0.25 SENSITIVE Sensitive     GENTAMICIN <=0.5 SENSITIVE Sensitive     OXACILLIN <=0.25 SENSITIVE Sensitive     TETRACYCLINE <=1 SENSITIVE Sensitive     VANCOMYCIN <=0.5 SENSITIVE Sensitive     TRIMETH/SULFA <=10 SENSITIVE Sensitive     CLINDAMYCIN <=0.25 SENSITIVE Sensitive     RIFAMPIN <=0.5 SENSITIVE Sensitive     Inducible Clindamycin NEGATIVE Sensitive     * STAPHYLOCOCCUS SPECIES (COAGULASE NEGATIVE)  Blood culture (routine x 2)     Status: None (Preliminary result)   Collection Time: 10/26/14 12:30 PM  Result Value Ref Range Status   Specimen Description BLOOD LEFT ARM  Final   Special Requests BOTTLES DRAWN AEROBIC AND ANAEROBIC 5CC  Final   Culture  Setup Time   Final    GRAM POSITIVE COCCI IN CLUSTERS AEROBIC BOTTLE ONLY CRITICAL RESULT CALLED TO, READ BACK BY AND VERIFIED WITH: A.Ricki Miller 1610 10/27/14 M.CAMPBELL GRAM STAIN REVIEWED-AGREE WITH RESULT S. YARBROUGH    Culture   Final    STAPHYLOCOCCUS SPECIES (COAGULASE NEGATIVE) SUSCEPTIBILITIES PERFORMED ON PREVIOUS CULTURE WITHIN THE LAST 5 DAYS.    Report Status PENDING  Incomplete  Body fluid culture     Status: None (Preliminary result)   Collection Time: 10/26/14  5:49 PM  Result Value Ref Range Status   Specimen Description SYNOVIAL LEFT KNEE  Final   Special Requests FLUID ON A SWAB  Final   Gram Stain   Final    FEW WBC PRESENT,BOTH PMN  AND MONONUCLEAR NO ORGANISMS SEEN    Culture   Final    STAPHYLOCOCCUS AUREUS SUSCEPTIBILITIES PERFORMED ON PREVIOUS CULTURE WITHIN THE LAST 5 DAYS. STREPTOCOCCUS GROUP D;high probability for S.bovis SUSCEPTIBILITIES TO FOLLOW    Report Status PENDING  Incomplete  Anaerobic culture     Status: None (Preliminary result)   Collection Time: 10/26/14  5:49 PM  Result Value Ref Range Status   Specimen Description SYNOVIAL LEFT KNEE  Final   Special Requests A FLUID ON SWAB  Final   Gram Stain   Final    FEW WBC PRESENT,BOTH PMN AND MONONUCLEAR NO ORGANISMS SEEN    Culture   Final    NO ANAEROBES ISOLATED; CULTURE IN PROGRESS FOR 5 DAYS   Report Status PENDING  Incomplete  Fungus Culture with Smear     Status: None (Preliminary result)   Collection Time: 10/26/14  5:49 PM  Result Value Ref Range Status   Specimen Description SYNOVIAL LEFT KNEE  Final   Special Requests A FLUID ON SWAB  Final   Fungal Smear   Final    NO YEAST OR FUNGAL ELEMENTS SEEN Performed at [facility]    Culture   Final    CULTURE IN PROGRESS FOR FOUR WEEKS  Performed at [facility]    Report Status PENDING  Incomplete  AFB culture with smear     Status: None (Preliminary result)   Collection Time: 10/26/14  5:49 PM  Result Value Ref Range Status   Specimen Description SYNOVIAL LEFT KNEE  Final   Special Requests A FLUID ON SWAB  Final   Acid Fast Smear   Final    NO ACID FAST BACILLI SEEN Performed at [facility]    Culture   Final    CULTURE WILL BE EXAMINED FOR 6 WEEKS BEFORE ISSUING A FINAL REPORT Performed at [facility]    Report Status PENDING  Incomplete  Anaerobic culture     Status: None (Preliminary result)   Collection Time: 10/26/14  5:51 PM  Result Value Ref Range Status   Specimen Description SYNOVIAL RIGHT KNEE  Final   Special Requests B FLUID ON SWAB  Final   Gram Stain   Final    FEW WBC PRESENT,BOTH PMN AND MONONUCLEAR NO ORGANISMS SEEN     Culture   Final    NO ANAEROBES ISOLATED; CULTURE IN PROGRESS FOR 5 DAYS   Report Status PENDING  Incomplete  Body fluid culture     Status: None   Collection Time: 10/26/14  5:51 PM  Result Value Ref Range Status   Specimen Description SYNOVIAL RIGHT KNEE  Final   Special Requests B FLUID ON SWAB  Final   Gram Stain   Final    FEW WBC PRESENT,BOTH PMN AND MONONUCLEAR NO ORGANISMS SEEN    Culture NO GROWTH 3 DAYS  Final   Report Status 10/30/2014 FINAL  Final  Fungus Culture with Smear     Status: None (Preliminary result)   Collection Time: 10/26/14  5:51 PM  Result Value Ref Range Status   Specimen Description SYNOVIAL RIGHT KNEE  Final   Special Requests B FLUID ON SWAB  Final   Fungal Smear   Final    NO YEAST OR FUNGAL ELEMENTS SEEN Performed at [facility]    Culture   Final    CULTURE IN PROGRESS FOR FOUR WEEKS Performed at [facility]    Report Status PENDING  Incomplete  AFB culture with smear     Status: None (Preliminary result)   Collection Time: 10/26/14  5:51 PM  Result Value Ref Range Status   Specimen Description SYNOVIAL RIGHT KNEE  Final   Special Requests B FLUID ON SWAB  Final   Acid Fast Smear   Final    NO ACID FAST BACILLI SEEN Performed at [facility]    Culture   Final    CULTURE WILL BE EXAMINED FOR 6 WEEKS BEFORE ISSUING A FINAL REPORT Performed at [facility]    Report Status PENDING  Incomplete  AFB culture with smear     Status: None (Preliminary result)   Collection Time: 10/26/14  5:51 PM  Result Value Ref Range Status   Specimen Description TISSUE RIGHT KNEE  Final   Special Requests D PT ON ZOSYN VANCOMYCIN  Final   Acid Fast Smear   Final    NO ACID FAST BACILLI SEEN Performed at [facility]    Culture   Final    CULTURE WILL BE EXAMINED FOR 6 WEEKS BEFORE ISSUING A FINAL REPORT Performed at [facility]    Report Status PENDING  Incomplete  Anaerobic culture     Status:  None (Preliminary result)  Collection Time: 10/26/14  5:55 PM  Result Value Ref Range Status   Specimen Description TISSUE LEFT KNEE  Final   Special Requests C  Final   Gram Stain   Final    NO WBC SEEN FEW GRAM POSITIVE COCCI IN PAIRS FEW GRAM POSITIVE RODS [facility]    Culture   Final    NO ANAEROBES ISOLATED; CULTURE IN PROGRESS FOR 5 DAYS [facility]    Report Status PENDING  Incomplete  Tissue culture     Status: None   Collection Time: 10/26/14  5:55 PM  Result Value Ref Range Status   Specimen Description TISSUE LEFT KNEE  Final   Special Requests C  Final   Gram Stain   Final    NO WBC SEEN FEW GRAM POSITIVE COCCI IN PAIRS FEW GRAM POSITIVE RODS [facility]    Culture   Final    ABUNDANT STAPHYLOCOCCUS AUREUS Note: RIFAMPIN AND GENTAMICIN SHOULD NOT BE USED AS SINGLE DRUGS FOR TREATMENT OF STAPH INFECTIONS. Performed at [facility]    Report Status 10/30/2014 FINAL  Final   Organism ID, Bacteria STAPHYLOCOCCUS AUREUS  Final      Susceptibility   Staphylococcus aureus - MIC*    CLINDAMYCIN <=0.25 SENSITIVE Sensitive     ERYTHROMYCIN <=0.25 SENSITIVE Sensitive     GENTAMICIN <=0.5 SENSITIVE Sensitive     LEVOFLOXACIN 0.25 SENSITIVE Sensitive     OXACILLIN 0.5 SENSITIVE Sensitive     PENICILLIN >=0.5 RESISTANT Resistant     RIFAMPIN <=0.5 SENSITIVE Sensitive     TRIMETH/SULFA <=10 SENSITIVE Sensitive     VANCOMYCIN 1 SENSITIVE Sensitive     TETRACYCLINE <=1 SENSITIVE Sensitive     MOXIFLOXACIN <=0.25 SENSITIVE Sensitive     * ABUNDANT STAPHYLOCOCCUS AUREUS  Fungus Culture with Smear     Status: None (Preliminary result)   Collection Time: 10/26/14  5:55 PM  Result Value Ref Range Status   Specimen Description TISSUE LEFT KNEE  Final   Special Requests C  Final   Fungal Smear   Final    NO YEAST OR FUNGAL ELEMENTS SEEN [facility]    Culture   Final     CULTURE IN PROGRESS FOR FOUR WEEKS Performed at [facility]    Report Status PENDING  Incomplete  AFB culture with smear     Status: None (Preliminary result)   Collection Time: 10/26/14  5:55 PM  Result Value Ref Range Status   Specimen Description TISSUE LEFT KNEE  Final   Special Requests C  Final   Acid Fast Smear   Final    NO ACID FAST BACILLI SEEN [facility]    Culture   Final    CULTURE WILL BE EXAMINED FOR 6 WEEKS BEFORE ISSUING A FINAL REPORT [facility]    Report Status PENDING  Incomplete  Anaerobic culture     Status: None (Preliminary result)   Collection Time: 10/26/14  5:57 PM  Result Value Ref Range Status   Specimen Description TISSUE RIGHT KNEE  Final   Special Requests D PT ON ZOSYN AND VANCOMYCIN  Final   Gram Stain PENDING  Incomplete   Culture   Final    NO ANAEROBES ISOLATED; CULTURE IN PROGRESS FOR 5 DAYS [facility]    Report Status PENDING  Incomplete  Fungus Culture with Smear  Status: None (Preliminary result)   Collection Time: 10/26/14  5:57 PM  Result Value Ref Range Status   Specimen Description TISSUE RIGHT KNEE  Final   Special Requests D PT ON ZOSYN VANCOMYCIN  Final   Fungal Smear   Final    NO YEAST OR FUNGAL ELEMENTS SEEN Performed at [facility]    Culture   Final    CULTURE IN PROGRESS FOR FOUR WEEKS Performed at [facility]    Report Status PENDING  Incomplete  Tissue culture     Status: None   Collection Time: 10/26/14  5:57 PM  Result Value Ref Range Status   Specimen Description TISSUE RIGHT KNEE  Final   Special Requests D PT ON ZOSYN VANCOMYCIN  Final   Gram Stain   Final    NO WBC SEEN NO ORGANISMS SEEN Performed at [facility]    Culture   Final    FEW PSEUDOMONAS AERUGINOSA MODERATE STAPHYLOCOCCUS AUREUS Note: RIFAMPIN AND GENTAMICIN SHOULD NOT BE USED AS SINGLE DRUGS FOR TREATMENT OF STAPH  INFECTIONS. Performed at [facility]    Report Status 10/30/2014 FINAL  Final   Organism ID, Bacteria PSEUDOMONAS AERUGINOSA  Final   Organism ID, Bacteria STAPHYLOCOCCUS AUREUS  Final      Susceptibility   Staphylococcus aureus - MIC*    CLINDAMYCIN <=0.25 SENSITIVE Sensitive     ERYTHROMYCIN <=0.25 SENSITIVE Sensitive     GENTAMICIN <=0.5 SENSITIVE Sensitive     LEVOFLOXACIN 0.25 SENSITIVE Sensitive     OXACILLIN 0.5 SENSITIVE Sensitive     PENICILLIN >=0.5 RESISTANT Resistant     RIFAMPIN <=0.5 SENSITIVE Sensitive     TRIMETH/SULFA <=10 SENSITIVE Sensitive     VANCOMYCIN <=0.5 SENSITIVE Sensitive     TETRACYCLINE <=1 SENSITIVE Sensitive     MOXIFLOXACIN <=0.25 SENSITIVE Sensitive     * MODERATE STAPHYLOCOCCUS AUREUS   Pseudomonas aeruginosa - MIC*    CEFEPIME <=1 SENSITIVE Sensitive     CEFTAZIDIME 2 SENSITIVE Sensitive     CIPROFLOXACIN <=0.25 SENSITIVE Sensitive     GENTAMICIN <=1 SENSITIVE Sensitive     IMIPENEM 2 SENSITIVE Sensitive     PIP/TAZO <=4 SENSITIVE Sensitive     TOBRAMYCIN <=1 SENSITIVE Sensitive     * FEW PSEUDOMONAS AERUGINOSA  Surgical pcr screen     Status: Abnormal   Collection Time: 10/29/14  6:25 AM  Result Value Ref Range Status   MRSA, PCR NEGATIVE NEGATIVE Final   Staphylococcus aureus POSITIVE (A) NEGATIVE Final    Comment:        The Xpert SA Assay (FDA approved for NASAL specimens in patients over 58 years of age), is one component of a comprehensive surveillance program.  Test performance has been validated by [facility] for patients greater than or equal to 43 year old. It is not intended to diagnose infection nor to guide or monitor treatment.      Ardith Dark, MD 10/30/2014, 11:39 AM PGY-2, [facility] FPTS Intern pager: [phone], text pages welcome

## 2014-10-31 DIAGNOSIS — A498 Other bacterial infections of unspecified site: Secondary | ICD-10-CM | POA: Diagnosis present

## 2014-10-31 DIAGNOSIS — B965 Pseudomonas (aeruginosa) (mallei) (pseudomallei) as the cause of diseases classified elsewhere: Secondary | ICD-10-CM

## 2014-10-31 LAB — BASIC METABOLIC PANEL
Anion gap: 6 (ref 5–15)
BUN: 12 mg/dL (ref 6–20)
CO2: 27 mmol/L (ref 22–32)
CREATININE: 1.18 mg/dL (ref 0.61–1.24)
Calcium: 8.2 mg/dL — ABNORMAL LOW (ref 8.9–10.3)
Chloride: 106 mmol/L (ref 101–111)
GFR calc Af Amer: >60 mL/min (ref >60)
GFR calc non Af Amer: >60 mL/min (ref >60)
GLUCOSE: 94 mg/dL (ref 65–99)
Potassium: 3.7 mmol/L (ref 3.5–5.1)
Sodium: 139 mmol/L (ref 135–145)

## 2014-10-31 LAB — CULTURE, BLOOD (ROUTINE X 2)

## 2014-10-31 LAB — ANAEROBIC CULTURE: Gram Stain: NONE SEEN

## 2014-10-31 NOTE — Progress Notes (Signed)
NCM spoke with resident and they state patient is not ready for dc yet, he will need to go back to the OR for some more I and D and they were looking at doing a skin graft.  NCM contacted CSW on weekend  To see if SNF will have a wound vac for patient if needed when he goes there.  Tywan says he will call to see.   Patient is now agreeable to go to SNF , per CSW.   If patient goes to snf and will need wound vac, the SNF will need to be aware.

## 2014-10-31 NOTE — Progress Notes (Signed)
Family Medicine Teaching Service Daily Progress Note Intern Pager: [phone]  Patient name: Timothy Norman Medical record number: [id number] Date of birth: 1950-05-01 Age: 64 y.o. Gender: male  Primary Care Provider: No primary care provider on file. Consultants: Ortho, ID Code Status: FuLL  Pt Overview and Major Events to Date:  07/12: Admitted to floor, Ortho debrided legs in OR 07/13: Wound VAC in place BL LE, ID consult  Assessment and Plan: Timothy Norman is a 64 y.o. male presenting with severe bilateral necrotic leg wounds. PMH is significant for alcohol abuse.   Bilateral Necrotic Leg Wounds: Patient reports that wound started after a fall the week prior to admission, however appeared to be infected for months and maggots were removed from the wounds upon presentation to the ED. Patient is now s/p significant debridement and bilateral knee washout. Blood cultures have grown MSSA and wound cultures have grown pseudomonas.  -Orthopedics consulted, appreciate assistance -Vanc (7/12-7/14), Zosyn (7/12- ) -PT/OT consulted, recommend SNF placement -Pain control with tylenol and IV morphine 4mg  q4hrs prn  Bacteremia; 2/2 Blood cultures positive for MSSA. HIV, RPR, hepatitis panel negative.  -ID consulted, appreciate assistance -Abx as above -TTE with preserved EF, no obvious endocarditis.  -Consider TEE -Repeat blood cultures (7/15) pending.   AMS/Cognitive Impairment. Patient noted to have abnormal behavior in the ED. Ethanol negative. UDS only positive for benzos and opiates which patient received in ED. LFTs unremarkable. Psychiatric history unknown, possible schizophrenia.  Friend JB reports that this is the patient's baseline mental status. HEad CT noted to have mild bilateral frontal encephalomalacia.  -Monitor mental status -Call family member to get more information if needed, name is [name] (cousin): [phone]  Alcohol Abuse; Chronic -consult social work -CIWA  protocol - Thiamine, folate, multivitamin.   Malnutrition. Albumin 2.9 on admission. - Nutrition consulted, appreciate assistance.   FEN/GI: SLIV, regular PPx: Heparin  Disposition: Admitted to med-surg floor pending above work up. Anticipate discharge to SNF when medically stable.   Subjective:  Patient expressed desire to take a bath and go outside in a wheel chair this morning. Doing well otherwise. No fevers or chills. No chest pain or shortness of breath.   Objective: Temp:  [97.5 F (36.4 C)-98.5 F (36.9 C)] 98.3 F (36.8 C) (07/17 0452) Pulse Rate:  [89-102] 91 (07/17 0452) Resp:  [14-22] 16 (07/17 0452) BP: (121-140)/(74-84) 140/81 mmHg (07/17 0452) SpO2:  [98 %-100 %] 99 % (07/17 0452) Physical Exam: General: NAD, sitting in bedside chair.  Cardiovascular: RRR, no murmurs Respiratory: clear to auscultation bilaterally, no increased WOB Abdomen: (+) BS, nontender, nondistended Extremities: wound vac in place in bilateral lower extremities, draining serosanginous fluid.  Neuro: alert and oriented. No focal neurologic deficits.   Laboratory/Micro/Diagnostic Studies: Recent Results (from the past 240 hour(s))  Blood culture (routine x 2)     Status: None (Preliminary result)   Collection Time: 10/26/14 12:30 PM  Result Value Ref Range Status   Specimen Description BLOOD RIGHT ARM  Final   Special Requests BOTTLES DRAWN AEROBIC AND ANAEROBIC 5CC  Final   Culture  Setup Time   Final    GRAM POSITIVE COCCI IN CLUSTERS IN BOTH AEROBIC AND ANAEROBIC BOTTLES CRITICAL RESULT CALLED TO, READ BACK BY AND VERIFIED WITH: A.Ricki Miller 1478 10/27/14 M.CAMPBELL GRAM STAIN REVIEWED-AGREE WITH RESULT Lucienne Capers    Culture   Final    STAPHYLOCOCCUS AUREUS STAPHYLOCOCCUS SPECIES (COAGULASE NEGATIVE)    Report Status PENDING  Incomplete   Organism ID, Bacteria STAPHYLOCOCCUS  AUREUS  Final   Organism ID, Bacteria STAPHYLOCOCCUS SPECIES (COAGULASE NEGATIVE)  Final       Susceptibility   Staphylococcus aureus - MIC*    CIPROFLOXACIN <=0.5 SENSITIVE Sensitive     ERYTHROMYCIN <=0.25 SENSITIVE Sensitive     GENTAMICIN <=0.5 SENSITIVE Sensitive     OXACILLIN 0.5 SENSITIVE Sensitive     TETRACYCLINE <=1 SENSITIVE Sensitive     VANCOMYCIN <=0.5 SENSITIVE Sensitive     TRIMETH/SULFA <=10 SENSITIVE Sensitive     CLINDAMYCIN <=0.25 SENSITIVE Sensitive     RIFAMPIN <=0.5 SENSITIVE Sensitive     Inducible Clindamycin NEGATIVE Sensitive     * STAPHYLOCOCCUS AUREUS   Staphylococcus species (coagulase negative) - MIC*    CIPROFLOXACIN <=0.5 SENSITIVE Sensitive     ERYTHROMYCIN <=0.25 SENSITIVE Sensitive     GENTAMICIN <=0.5 SENSITIVE Sensitive     OXACILLIN <=0.25 SENSITIVE Sensitive     TETRACYCLINE <=1 SENSITIVE Sensitive     VANCOMYCIN <=0.5 SENSITIVE Sensitive     TRIMETH/SULFA <=10 SENSITIVE Sensitive     CLINDAMYCIN <=0.25 SENSITIVE Sensitive     RIFAMPIN <=0.5 SENSITIVE Sensitive     Inducible Clindamycin NEGATIVE Sensitive     * STAPHYLOCOCCUS SPECIES (COAGULASE NEGATIVE)  Blood culture (routine x 2)     Status: None (Preliminary result)   Collection Time: 10/26/14 12:30 PM  Result Value Ref Range Status   Specimen Description BLOOD LEFT ARM  Final   Special Requests BOTTLES DRAWN AEROBIC AND ANAEROBIC 5CC  Final   Culture  Setup Time   Final    GRAM POSITIVE COCCI IN CLUSTERS AEROBIC BOTTLE ONLY CRITICAL RESULT CALLED TO, READ BACK BY AND VERIFIED WITH: A.Ricki Miller 1610 10/27/14 M.CAMPBELL GRAM STAIN REVIEWED-AGREE WITH RESULT S. YARBROUGH    Culture   Final    STAPHYLOCOCCUS SPECIES (COAGULASE NEGATIVE) SUSCEPTIBILITIES PERFORMED ON PREVIOUS CULTURE WITHIN THE LAST 5 DAYS.    Report Status PENDING  Incomplete  Body fluid culture     Status: None (Preliminary result)   Collection Time: 10/26/14  5:49 PM  Result Value Ref Range Status   Specimen Description SYNOVIAL LEFT KNEE  Final   Special Requests FLUID ON A SWAB  Final   Gram Stain    Final    FEW WBC PRESENT,BOTH PMN AND MONONUCLEAR NO ORGANISMS SEEN    Culture   Final    STAPHYLOCOCCUS AUREUS SUSCEPTIBILITIES PERFORMED ON PREVIOUS CULTURE WITHIN THE LAST 5 DAYS. STREPTOCOCCUS GROUP D;high probability for S.bovis SUSCEPTIBILITIES TO FOLLOW    Report Status PENDING  Incomplete  Anaerobic culture     Status: None (Preliminary result)   Collection Time: 10/26/14  5:49 PM  Result Value Ref Range Status   Specimen Description SYNOVIAL LEFT KNEE  Final   Special Requests A FLUID ON SWAB  Final   Gram Stain   Final    FEW WBC PRESENT,BOTH PMN AND MONONUCLEAR NO ORGANISMS SEEN    Culture   Final    NO ANAEROBES ISOLATED; CULTURE IN PROGRESS FOR 5 DAYS   Report Status PENDING  Incomplete  Fungus Culture with Smear     Status: None (Preliminary result)   Collection Time: 10/26/14  5:49 PM  Result Value Ref Range Status   Specimen Description SYNOVIAL LEFT KNEE  Final   Special Requests A FLUID ON SWAB  Final   Fungal Smear   Final    NO YEAST OR FUNGAL ELEMENTS SEEN Performed at [facility]    Culture   Final  CULTURE IN PROGRESS FOR FOUR WEEKS Performed at [facility]    Report Status PENDING  Incomplete  AFB culture with smear     Status: None (Preliminary result)   Collection Time: 10/26/14  5:49 PM  Result Value Ref Range Status   Specimen Description SYNOVIAL LEFT KNEE  Final   Special Requests A FLUID ON SWAB  Final   Acid Fast Smear   Final    NO ACID FAST BACILLI SEEN Performed at [facility]    Culture   Final    CULTURE WILL BE EXAMINED FOR 6 WEEKS BEFORE ISSUING A FINAL REPORT Performed at [facility]    Report Status PENDING  Incomplete  Anaerobic culture     Status: None (Preliminary result)   Collection Time: 10/26/14  5:51 PM  Result Value Ref Range Status   Specimen Description SYNOVIAL RIGHT KNEE  Final   Special Requests B FLUID ON SWAB  Final   Gram Stain   Final    FEW WBC PRESENT,BOTH PMN  AND MONONUCLEAR NO ORGANISMS SEEN    Culture   Final    NO ANAEROBES ISOLATED; CULTURE IN PROGRESS FOR 5 DAYS   Report Status PENDING  Incomplete  Body fluid culture     Status: None   Collection Time: 10/26/14  5:51 PM  Result Value Ref Range Status   Specimen Description SYNOVIAL RIGHT KNEE  Final   Special Requests B FLUID ON SWAB  Final   Gram Stain   Final    FEW WBC PRESENT,BOTH PMN AND MONONUCLEAR NO ORGANISMS SEEN    Culture NO GROWTH 3 DAYS  Final   Report Status 10/30/2014 FINAL  Final  Fungus Culture with Smear     Status: None (Preliminary result)   Collection Time: 10/26/14  5:51 PM  Result Value Ref Range Status   Specimen Description SYNOVIAL RIGHT KNEE  Final   Special Requests B FLUID ON SWAB  Final   Fungal Smear   Final    NO YEAST OR FUNGAL ELEMENTS SEEN Performed at [facility]    Culture   Final    CULTURE IN PROGRESS FOR FOUR WEEKS Performed at [facility]    Report Status PENDING  Incomplete  AFB culture with smear     Status: None (Preliminary result)   Collection Time: 10/26/14  5:51 PM  Result Value Ref Range Status   Specimen Description SYNOVIAL RIGHT KNEE  Final   Special Requests B FLUID ON SWAB  Final   Acid Fast Smear   Final    NO ACID FAST BACILLI SEEN Performed at [facility]    Culture   Final    CULTURE WILL BE EXAMINED FOR 6 WEEKS BEFORE ISSUING A FINAL REPORT Performed at [facility]    Report Status PENDING  Incomplete  AFB culture with smear     Status: None (Preliminary result)   Collection Time: 10/26/14  5:51 PM  Result Value Ref Range Status   Specimen Description TISSUE RIGHT KNEE  Final   Special Requests D PT ON ZOSYN VANCOMYCIN  Final   Acid Fast Smear   Final    NO ACID FAST BACILLI SEEN Performed at [facility]    Culture   Final    CULTURE WILL BE EXAMINED FOR 6 WEEKS BEFORE ISSUING A FINAL REPORT Performed at [facility]    Report Status PENDING   Incomplete  Anaerobic culture  Status: None (Preliminary result)   Collection Time: 10/26/14  5:55 PM  Result Value Ref Range Status   Specimen Description TISSUE LEFT KNEE  Final   Special Requests C  Final   Gram Stain   Final    NO WBC SEEN FEW GRAM POSITIVE COCCI IN PAIRS FEW GRAM POSITIVE RODS [facility]    Culture   Final    NO ANAEROBES ISOLATED; CULTURE IN PROGRESS FOR 5 DAYS [facility]    Report Status PENDING  Incomplete  Tissue culture     Status: None   Collection Time: 10/26/14  5:55 PM  Result Value Ref Range Status   Specimen Description TISSUE LEFT KNEE  Final   Special Requests C  Final   Gram Stain   Final    NO WBC SEEN FEW GRAM POSITIVE COCCI IN PAIRS FEW GRAM POSITIVE RODS [facility]    Culture   Final    ABUNDANT STAPHYLOCOCCUS AUREUS Note: RIFAMPIN AND GENTAMICIN SHOULD NOT BE USED AS SINGLE DRUGS FOR TREATMENT OF STAPH INFECTIONS. Performed at [facility]    Report Status 10/30/2014 FINAL  Final   Organism ID, Bacteria STAPHYLOCOCCUS AUREUS  Final      Susceptibility   Staphylococcus aureus - MIC*    CLINDAMYCIN <=0.25 SENSITIVE Sensitive     ERYTHROMYCIN <=0.25 SENSITIVE Sensitive     GENTAMICIN <=0.5 SENSITIVE Sensitive     LEVOFLOXACIN 0.25 SENSITIVE Sensitive     OXACILLIN 0.5 SENSITIVE Sensitive     PENICILLIN >=0.5 RESISTANT Resistant     RIFAMPIN <=0.5 SENSITIVE Sensitive     TRIMETH/SULFA <=10 SENSITIVE Sensitive     VANCOMYCIN 1 SENSITIVE Sensitive     TETRACYCLINE <=1 SENSITIVE Sensitive     MOXIFLOXACIN <=0.25 SENSITIVE Sensitive     * ABUNDANT STAPHYLOCOCCUS AUREUS  Fungus Culture with Smear     Status: None (Preliminary result)   Collection Time: 10/26/14  5:55 PM  Result Value Ref Range Status   Specimen Description TISSUE LEFT KNEE  Final   Special Requests C  Final   Fungal Smear   Final    NO YEAST OR FUNGAL ELEMENTS SEEN [facility]formed [facility]    Culture   Final    CULTURE IN PROGRESS FOR FOUR WEEKS Performed at [facility]    Report Status PENDING  Incomplete  AFB culture with smear     Status: None (Preliminary result)   Collection Time: 10/26/14  5:55 PM  Result Value Ref Range Status   Specimen Description TISSUE LEFT KNEE  Final   Special Requests C  Final   Acid Fast Smear   Final    NO ACID FAST BACILLI SEEN [facility]    Culture   Final    CULTURE WILL BE EXAMINED FOR 6 WEEKS BEFORE ISSUING A FINAL REPORT [facility]    Report Status PENDING  Incomplete  Anaerobic culture     Status: None (Preliminary result)   Collection Time: 10/26/14  5:57 PM  Result Value Ref Range Status   Specimen Description TISSUE RIGHT KNEE  Final   Special Requests D PT ON ZOSYN AND VANCOMYCIN  Final   Gram Stain PENDING  Incomplete   Culture   Final    NO ANAEROBES ISOLATED; CULTURE IN PROGRESS FOR 5 DAYS [facility]formed at [facility]    Report Status PENDING  Incomplete  Fungus Culture  with Smear     Status: None (Preliminary result)   Collection Time: 10/26/14  5:57 PM  Result Value Ref Range Status   Specimen Description TISSUE RIGHT KNEE  Final   Special Requests D PT ON ZOSYN VANCOMYCIN  Final   Fungal Smear   Final    NO YEAST OR FUNGAL ELEMENTS SEEN Performed at [facility]    Culture   Final    CULTURE IN PROGRESS FOR FOUR WEEKS Performed at [facility]    Report Status PENDING  Incomplete  Tissue culture     Status: None   Collection Time: 10/26/14  5:57 PM  Result Value Ref Range Status   Specimen Description TISSUE RIGHT KNEE  Final   Special Requests D PT ON ZOSYN VANCOMYCIN  Final   Gram Stain   Final    NO WBC SEEN NO ORGANISMS SEEN Performed at [facility]    Culture   Final    FEW PSEUDOMONAS AERUGINOSA MODERATE STAPHYLOCOCCUS AUREUS Note: RIFAMPIN AND GENTAMICIN SHOULD NOT BE USED AS SINGLE  DRUGS FOR TREATMENT OF STAPH INFECTIONS. Performed at [facility]    Report Status 10/30/2014 FINAL  Final   Organism ID, Bacteria PSEUDOMONAS AERUGINOSA  Final   Organism ID, Bacteria STAPHYLOCOCCUS AUREUS  Final      Susceptibility   Staphylococcus aureus - MIC*    CLINDAMYCIN <=0.25 SENSITIVE Sensitive     ERYTHROMYCIN <=0.25 SENSITIVE Sensitive     GENTAMICIN <=0.5 SENSITIVE Sensitive     LEVOFLOXACIN 0.25 SENSITIVE Sensitive     OXACILLIN 0.5 SENSITIVE Sensitive     PENICILLIN >=0.5 RESISTANT Resistant     RIFAMPIN <=0.5 SENSITIVE Sensitive     TRIMETH/SULFA <=10 SENSITIVE Sensitive     VANCOMYCIN <=0.5 SENSITIVE Sensitive     TETRACYCLINE <=1 SENSITIVE Sensitive     MOXIFLOXACIN <=0.25 SENSITIVE Sensitive     * MODERATE STAPHYLOCOCCUS AUREUS   Pseudomonas aeruginosa - MIC*    CEFEPIME <=1 SENSITIVE Sensitive     CEFTAZIDIME 2 SENSITIVE Sensitive     CIPROFLOXACIN <=0.25 SENSITIVE Sensitive     GENTAMICIN <=1 SENSITIVE Sensitive     IMIPENEM 2 SENSITIVE Sensitive     PIP/TAZO <=4 SENSITIVE Sensitive     TOBRAMYCIN <=1 SENSITIVE Sensitive     * FEW PSEUDOMONAS AERUGINOSA  Surgical pcr screen     Status: Abnormal   Collection Time: 10/29/14  6:25 AM  Result Value Ref Range Status   MRSA, PCR NEGATIVE NEGATIVE Final   Staphylococcus aureus POSITIVE (A) NEGATIVE Final    Comment:        The Xpert SA Assay (FDA approved for NASAL specimens in patients over 65 years of age), is one component of a comprehensive surveillance program.  Test performance has been validated by [facility] for patients greater than or equal to 24 year old. It is not intended to diagnose infection nor to guide or monitor treatment.   Culture, blood (routine x 2)     Status: None (Preliminary result)   Collection Time: 10/29/14  6:35 PM  Result Value Ref Range Status   Specimen Description BLOOD LEFT ANTECUBITAL  Final   Special Requests BOTTLES DRAWN AEROBIC AND ANAEROBIC 10CC   Final   Culture NO GROWTH < 24 HOURS  Final   Report Status PENDING  Incomplete  Culture, blood (routine x 2)     Status: None (Preliminary result)   Collection Time: 10/29/14  6:45 PM  Result Value Ref Range  Status   Specimen Description BLOOD LEFT HAND  Final   Special Requests BOTTLES DRAWN AEROBIC AND ANAEROBIC 10CC  Final   Culture NO GROWTH < 24 HOURS  Final   Report Status PENDING  Incomplete     Ardith Dark, MD 10/31/2014, 7:47 AM PGY-2, [facility] FPTS Intern pager: [phone], text pages welcome

## 2014-11-01 ENCOUNTER — Encounter (HOSPITAL_COMMUNITY): Payer: Self-pay | Admitting: Orthopaedic Surgery

## 2014-11-01 DIAGNOSIS — M00062 Staphylococcal arthritis, left knee: Secondary | ICD-10-CM

## 2014-11-01 DIAGNOSIS — M00061 Staphylococcal arthritis, right knee: Secondary | ICD-10-CM

## 2014-11-01 LAB — ANAEROBIC CULTURE: Gram Stain: NONE SEEN

## 2014-11-01 MED ORDER — CHLORHEXIDINE GLUCONATE CLOTH 2 % EX PADS
6.0000 | MEDICATED_PAD | Freq: Every day | CUTANEOUS | Status: DC
Start: 2014-11-01 — End: 2014-11-05
  Administered 2014-11-01 – 2014-11-03 (×2): 6 via TOPICAL

## 2014-11-01 MED ORDER — MUPIROCIN 2 % EX OINT
1.0000 | TOPICAL_OINTMENT | Freq: Two times a day (BID) | CUTANEOUS | Status: DC
Start: 2014-11-01 — End: 2014-11-05
  Administered 2014-11-01 – 2014-11-05 (×7): 1 via NASAL
  Filled 2014-11-01 (×3): qty 22

## 2014-11-01 NOTE — Care Management Important Message (Signed)
Important Message  Patient Details  Name: Riccardo DubinWarren D Violett MRN: [id number] Date of Birth: 09/30/1950   Medicare Important Message Given:  Yes-third notification given    Yvonna Alanisndra M Robinson 11/01/2014, 3:21 PM

## 2014-11-01 NOTE — Progress Notes (Signed)
Family Medicine Teaching Service Daily Progress Note Intern Pager: [phone]  Patient name: Timothy Norman Medical record number: [id number] Date of birth: [birth year] Age: 64 y.o. Gender: male  Primary Care Provider: No primary care provider on file. Consultants: Ortho, ID Code Status: FuLL  Pt Overview and Major Events to Date:  07/12: Admitted to floor, Ortho debrided legs in OR 07/13: Wound VAC in place BL LE, ID consult  Assessment and Plan: Timothy Norman is a 64 y.o. male presenting with severe bilateral necrotic leg wounds. PMH is significant for alcohol abuse.   Bilateral Necrotic Leg Wounds: Patient reports that wound started after a fall the week prior to admission, however appeared to be infected for months and maggots were removed from the wounds upon presentation to the ED. Patient is now s/p significant debridement and bilateral knee washout. Blood cultures have grown MSSA and wound cultures have grown pseudomonas.  -Orthopedics consulted, appreciate assistance -Vanc (7/12-7/14), Zosyn (7/12- ) -PT/OT consulted, recommend SNF placement -Pain control with tylenol and IV morphine 4mg  q4hrs prn  Bacteremia; 2/2 Blood cultures positive for MSSA. HIV, RPR, hepatitis panel negative.  -ID consulted, appreciate assistance -Abx as above -TTE with preserved EF, no obvious endocarditis.  -f/u TEE -Repeat blood cultures (7/15) pending.   AMS/Cognitive Impairment. Patient noted to have abnormal behavior in the ED. Ethanol negative. UDS only positive for benzos and opiates which patient received in ED. LFTs unremarkable. Psychiatric history unknown, possible schizophrenia.  Friend JB reports that this is the patient's baseline mental status. HEad CT noted to have mild bilateral frontal encephalomalacia.  -Monitor mental status -Call family member to get more information if needed, name is [name] (cousin): [phone]  Alcohol Abuse; Chronic -social work consulted -CIWA protocol  discontinued - Thiamine, folate, multivitamin.   Malnutrition. Albumin 2.9 on admission. - Nutrition consulted, appreciate assistance.   FEN/GI: SLIV, regular PPx: Heparin  Disposition: Admitted to med-surg floor pending above work up. Anticipate discharge to SNF when medically stable.   Subjective:  Patient is doing well this morning, resting comfortably in bed. He expressed that he would like to get a wheelchair so he can leave his bed. He also would like another bath. Patient said he saw cars flying outside his window in the sky.    Objective: Temp:  [97.7 F (36.5 C)-98.2 F (36.8 C)] 98.1 F (36.7 C) (07/18 0521) Pulse Rate:  [90-101] 101 (07/18 0521) Resp:  [16-18] 16 (07/18 0521) BP: (130-152)/(75-94) 152/94 mmHg (07/18 0521) SpO2:  [98 %-99 %] 99 % (07/18 0521) Physical Exam: General: NAD, sitting in bedside chair.  Cardiovascular: RRR, no murmurs Respiratory: clear to auscultation bilaterally, no increased WOB Abdomen: (+) BS, nontender, nondistended Extremities: wound vac in place in bilateral lower extremities, draining serosanginous fluid.  Neuro: alert and oriented. No focal neurologic deficits.  Psych: poor insight. (+) visual hallucinations  Laboratory/Micro/Diagnostic Studies: Recent Results (from the past 240 hour(s))  Blood culture (routine x 2)     Status: None   Collection Time: 10/26/14 12:30 PM  Result Value Ref Range Status   Specimen Description BLOOD RIGHT ARM  Final   Special Requests BOTTLES DRAWN AEROBIC AND ANAEROBIC 5CC  Final   Culture  Setup Time   Final    GRAM POSITIVE COCCI IN CLUSTERS IN BOTH AEROBIC AND ANAEROBIC BOTTLES CRITICAL RESULT CALLED TO, READ BACK BY AND VERIFIED WITH: A.Ricki Miller [id number] 10/27/14 M.CAMPBELL GRAM STAIN REVIEWED-AGREE WITH RESULT Lucienne Capers    Culture   Final  STAPHYLOCOCCUS AUREUS STAPHYLOCOCCUS SPECIES (COAGULASE NEGATIVE)    Report Status 10/31/2014 FINAL  Final   Organism ID, Bacteria  STAPHYLOCOCCUS AUREUS  Final   Organism ID, Bacteria STAPHYLOCOCCUS SPECIES (COAGULASE NEGATIVE)  Final      Susceptibility   Staphylococcus aureus - MIC*    CIPROFLOXACIN <=0.5 SENSITIVE Sensitive     ERYTHROMYCIN <=0.25 SENSITIVE Sensitive     GENTAMICIN <=0.5 SENSITIVE Sensitive     OXACILLIN 0.5 SENSITIVE Sensitive     TETRACYCLINE <=1 SENSITIVE Sensitive     VANCOMYCIN <=0.5 SENSITIVE Sensitive     TRIMETH/SULFA <=10 SENSITIVE Sensitive     CLINDAMYCIN <=0.25 SENSITIVE Sensitive     RIFAMPIN <=0.5 SENSITIVE Sensitive     Inducible Clindamycin NEGATIVE Sensitive     * STAPHYLOCOCCUS AUREUS   Staphylococcus species (coagulase negative) - MIC*    CIPROFLOXACIN <=0.5 SENSITIVE Sensitive     ERYTHROMYCIN <=0.25 SENSITIVE Sensitive     GENTAMICIN <=0.5 SENSITIVE Sensitive     OXACILLIN <=0.25 SENSITIVE Sensitive     TETRACYCLINE <=1 SENSITIVE Sensitive     VANCOMYCIN <=0.5 SENSITIVE Sensitive     TRIMETH/SULFA <=10 SENSITIVE Sensitive     CLINDAMYCIN <=0.25 SENSITIVE Sensitive     RIFAMPIN <=0.5 SENSITIVE Sensitive     Inducible Clindamycin NEGATIVE Sensitive     * STAPHYLOCOCCUS SPECIES (COAGULASE NEGATIVE)  Blood culture (routine x 2)     Status: None   Collection Time: 10/26/14 12:30 PM  Result Value Ref Range Status   Specimen Description BLOOD LEFT ARM  Final   Special Requests BOTTLES DRAWN AEROBIC AND ANAEROBIC 5CC  Final   Culture  Setup Time   Final    GRAM POSITIVE COCCI IN CLUSTERS AEROBIC BOTTLE ONLY CRITICAL RESULT CALLED TO, READ BACK BY AND VERIFIED WITH: A.Ricki Miller [id number] 10/27/14 M.CAMPBELL GRAM STAIN REVIEWED-AGREE WITH RESULT S. YARBROUGH    Culture   Final    STAPHYLOCOCCUS SPECIES (COAGULASE NEGATIVE) SUSCEPTIBILITIES PERFORMED ON PREVIOUS CULTURE WITHIN THE LAST 5 DAYS.    Report Status 10/31/2014 FINAL  Final  Body fluid culture     Status: None (Preliminary result)   Collection Time: 10/26/14  5:49 PM  Result Value Ref Range Status   Specimen  Description SYNOVIAL LEFT KNEE  Final   Special Requests FLUID ON A SWAB  Final   Gram Stain   Final    FEW WBC PRESENT,BOTH PMN AND MONONUCLEAR NO ORGANISMS SEEN    Culture   Final    STAPHYLOCOCCUS AUREUS SUSCEPTIBILITIES PERFORMED ON PREVIOUS CULTURE WITHIN THE LAST 5 DAYS. STREPTOCOCCUS GROUP D;high probability for S.bovis SUSCEPTIBILITIES TO FOLLOW    Report Status PENDING  Incomplete  Anaerobic culture     Status: None (Preliminary result)   Collection Time: 10/26/14  5:49 PM  Result Value Ref Range Status   Specimen Description SYNOVIAL LEFT KNEE  Final   Special Requests A FLUID ON SWAB  Final   Gram Stain   Final    FEW WBC PRESENT,BOTH PMN AND MONONUCLEAR NO ORGANISMS SEEN    Culture   Final    NO ANAEROBES ISOLATED; CULTURE IN PROGRESS FOR 5 DAYS   Report Status PENDING  Incomplete  Fungus Culture with Smear     Status: None (Preliminary result)   Collection Time: 10/26/14  5:49 PM  Result Value Ref Range Status   Specimen Description SYNOVIAL LEFT KNEE  Final   Special Requests A FLUID ON SWAB  Final   Fungal Smear   Final    NO  YEAST OR FUNGAL ELEMENTS SEEN Performed at [facility]    Culture   Final    CULTURE IN PROGRESS FOR FOUR WEEKS Performed at [facility]    Report Status PENDING  Incomplete  AFB culture with smear     Status: None (Preliminary result)   Collection Time: 10/26/14  5:49 PM  Result Value Ref Range Status   Specimen Description SYNOVIAL LEFT KNEE  Final   Special Requests A FLUID ON SWAB  Final   Acid Fast Smear   Final    NO ACID FAST BACILLI SEEN Performed at [facility]    Culture   Final    CULTURE WILL BE EXAMINED FOR 6 WEEKS BEFORE ISSUING A FINAL REPORT Performed at [facility]    Report Status PENDING  Incomplete  Anaerobic culture     Status: None (Preliminary result)   Collection Time: 10/26/14  5:51 PM  Result Value Ref Range Status   Specimen Description SYNOVIAL RIGHT KNEE  Final    Special Requests B FLUID ON SWAB  Final   Gram Stain   Final    FEW WBC PRESENT,BOTH PMN AND MONONUCLEAR NO ORGANISMS SEEN    Culture   Final    NO ANAEROBES ISOLATED; CULTURE IN PROGRESS FOR 5 DAYS   Report Status PENDING  Incomplete  Body fluid culture     Status: None   Collection Time: 10/26/14  5:51 PM  Result Value Ref Range Status   Specimen Description SYNOVIAL RIGHT KNEE  Final   Special Requests B FLUID ON SWAB  Final   Gram Stain   Final    FEW WBC PRESENT,BOTH PMN AND MONONUCLEAR NO ORGANISMS SEEN    Culture NO GROWTH 3 DAYS  Final   Report Status 10/30/2014 FINAL  Final  Fungus Culture with Smear     Status: None (Preliminary result)   Collection Time: 10/26/14  5:51 PM  Result Value Ref Range Status   Specimen Description SYNOVIAL RIGHT KNEE  Final   Special Requests B FLUID ON SWAB  Final   Fungal Smear   Final    NO YEAST OR FUNGAL ELEMENTS SEEN Performed at [facility]    Culture   Final    CULTURE IN PROGRESS FOR FOUR WEEKS Performed at [facility]    Report Status PENDING  Incomplete  AFB culture with smear     Status: None (Preliminary result)   Collection Time: 10/26/14  5:51 PM  Result Value Ref Range Status   Specimen Description SYNOVIAL RIGHT KNEE  Final   Special Requests B FLUID ON SWAB  Final   Acid Fast Smear   Final    NO ACID FAST BACILLI SEEN Performed at [facility]    Culture   Final    CULTURE WILL BE EXAMINED FOR 6 WEEKS BEFORE ISSUING A FINAL REPORT Performed at [facility]    Report Status PENDING  Incomplete  AFB culture with smear     Status: None (Preliminary result)   Collection Time: 10/26/14  5:51 PM  Result Value Ref Range Status   Specimen Description TISSUE RIGHT KNEE  Final   Special Requests D PT ON ZOSYN VANCOMYCIN  Final   Acid Fast Smear   Final    NO ACID FAST BACILLI SEEN Performed at [facility]    Culture   Final    CULTURE WILL BE EXAMINED FOR 6 WEEKS  BEFORE ISSUING A FINAL REPORT  Performed at [facility]    Report Status PENDING  Incomplete  Anaerobic culture     Status: None   Collection Time: 10/26/14  5:55 PM  Result Value Ref Range Status   Specimen Description TISSUE LEFT KNEE  Final   Special Requests C  Final   Gram Stain   Final    NO WBC SEEN FEW GRAM POSITIVE COCCI IN PAIRS FEW GRAM POSITIVE RODS Performed at [facility]    Culture   Final    NO ANAEROBES ISOLATED Performed at [facility]    Report Status 10/31/2014 FINAL  Final  Tissue culture     Status: None   Collection Time: 10/26/14  5:55 PM  Result Value Ref Range Status   Specimen Description TISSUE LEFT KNEE  Final   Special Requests C  Final   Gram Stain   Final    NO WBC SEEN FEW GRAM POSITIVE COCCI IN PAIRS FEW GRAM POSITIVE RODS Performed at [facility]    Culture   Final    ABUNDANT STAPHYLOCOCCUS AUREUS Note: RIFAMPIN AND GENTAMICIN SHOULD NOT BE USED AS SINGLE DRUGS FOR TREATMENT OF STAPH INFECTIONS. Performed at [facility]    Report Status 10/30/2014 FINAL  Final   Organism ID, Bacteria STAPHYLOCOCCUS AUREUS  Final      Susceptibility   Staphylococcus aureus - MIC*    CLINDAMYCIN <=0.25 SENSITIVE Sensitive     ERYTHROMYCIN <=0.25 SENSITIVE Sensitive     GENTAMICIN <=0.5 SENSITIVE Sensitive     LEVOFLOXACIN 0.25 SENSITIVE Sensitive     OXACILLIN 0.5 SENSITIVE Sensitive     PENICILLIN >=0.5 RESISTANT Resistant     RIFAMPIN <=0.5 SENSITIVE Sensitive     TRIMETH/SULFA <=10 SENSITIVE Sensitive     VANCOMYCIN 1 SENSITIVE Sensitive     TETRACYCLINE <=1 SENSITIVE Sensitive     MOXIFLOXACIN <=0.25 SENSITIVE Sensitive     * ABUNDANT STAPHYLOCOCCUS AUREUS  Fungus Culture with Smear     Status: None (Preliminary result)   Collection Time: 10/26/14  5:55 PM  Result Value Ref Range Status   Specimen Description TISSUE LEFT KNEE  Final   Special Requests C  Final   Fungal Smear   Final    NO YEAST  OR FUNGAL ELEMENTS SEEN Performed at [facility]    Culture   Final    CULTURE IN PROGRESS FOR FOUR WEEKS Performed at [facility]    Report Status PENDING  Incomplete  AFB culture with smear     Status: None (Preliminary result)   Collection Time: 10/26/14  5:55 PM  Result Value Ref Range Status   Specimen Description TISSUE LEFT KNEE  Final   Special Requests C  Final   Acid Fast Smear   Final    NO ACID FAST BACILLI SEEN Performed at [facility]    Culture   Final    CULTURE WILL BE EXAMINED FOR 6 WEEKS BEFORE ISSUING A FINAL REPORT Performed at [facility]    Report Status PENDING  Incomplete  Anaerobic culture     Status: None (Preliminary result)   Collection Time: 10/26/14  5:57 PM  Result Value Ref Range Status   Specimen Description TISSUE RIGHT KNEE  Final   Special Requests D PT ON ZOSYN AND VANCOMYCIN  Final   Gram Stain PENDING  Incomplete   Culture   Final    NO ANAEROBES ISOLATED; CULTURE IN PROGRESS FOR 5 DAYS Performed at [facility]    Report Status PENDING  Incomplete  Fungus Culture with Smear     Status: None (Preliminary result)   Collection Time: 10/26/14  5:57 PM  Result Value Ref Range Status   Specimen Description TISSUE RIGHT KNEE  Final   Special Requests D PT ON ZOSYN VANCOMYCIN  Final   Fungal Smear   Final    NO YEAST OR FUNGAL ELEMENTS SEEN [facility]    Culture   Final    CULTURE IN PROGRESS FOR FOUR WEEKS [facility]    Report Status PENDING  Incomplete  Tissue culture     Status: None   Collection Time: 10/26/14  5:57 PM  Result Value Ref Range Status   Specimen Description TISSUE RIGHT KNEE  Final   Special Requests D PT ON ZOSYN VANCOMYCIN  Final   Gram Stain   Final    NO WBC SEEN NO ORGANISMS SEEN [facility]    Culture   Final    FEW PSEUDOMONAS AERUGINOSA MODERATE STAPHYLOCOCCUS AUREUS Note: RIFAMPIN AND  GENTAMICIN SHOULD NOT BE USED AS SINGLE DRUGS FOR TREATMENT OF STAPH INFECTIONS. Performed at [facility]    Report Status 10/30/2014 FINAL  Final   Organism ID, Bacteria PSEUDOMONAS AERUGINOSA  Final   Organism ID, Bacteria STAPHYLOCOCCUS AUREUS  Final      Susceptibility   Staphylococcus aureus - MIC*    CLINDAMYCIN <=0.25 SENSITIVE Sensitive     ERYTHROMYCIN <=0.25 SENSITIVE Sensitive     GENTAMICIN <=0.5 SENSITIVE Sensitive     LEVOFLOXACIN 0.25 SENSITIVE Sensitive     OXACILLIN 0.5 SENSITIVE Sensitive     PENICILLIN >=0.5 RESISTANT Resistant     RIFAMPIN <=0.5 SENSITIVE Sensitive     TRIMETH/SULFA <=10 SENSITIVE Sensitive     VANCOMYCIN <=0.5 SENSITIVE Sensitive     TETRACYCLINE <=1 SENSITIVE Sensitive     MOXIFLOXACIN <=0.25 SENSITIVE Sensitive     * MODERATE STAPHYLOCOCCUS AUREUS   Pseudomonas aeruginosa - MIC*    CEFEPIME <=1 SENSITIVE Sensitive     CEFTAZIDIME 2 SENSITIVE Sensitive     CIPROFLOXACIN <=0.25 SENSITIVE Sensitive     GENTAMICIN <=1 SENSITIVE Sensitive     IMIPENEM 2 SENSITIVE Sensitive     PIP/TAZO <=4 SENSITIVE Sensitive     TOBRAMYCIN <=1 SENSITIVE Sensitive     * FEW PSEUDOMONAS AERUGINOSA  Surgical pcr screen     Status: Abnormal   Collection Time: 10/29/14  6:25 AM  Result Value Ref Range Status   MRSA, PCR NEGATIVE NEGATIVE Final   Staphylococcus aureus POSITIVE (A) NEGATIVE Final    Comment:        The Xpert SA Assay (FDA approved for NASAL specimens in patients over 64 years of age), is one component of a comprehensive surveillance program.  Test performance has been validated by [facility] for patients greater than or equal to 64 year old. It is not intended to diagnose infection nor to guide or monitor treatment.   Culture, blood (routine x 2)     Status: None (Preliminary result)   Collection Time: 10/29/14  6:35 PM  Result Value Ref Range Status   Specimen Description BLOOD LEFT ANTECUBITAL  Final   Special Requests  BOTTLES DRAWN AEROBIC AND ANAEROBIC 10CC  Final   Culture NO GROWTH 2 DAYS  Final   Report Status PENDING  Incomplete  Culture, blood (routine x 2)     Status: None (Preliminary result)  Collection Time: 10/29/14  6:45 PM  Result Value Ref Range Status   Specimen Description BLOOD LEFT HAND  Final   Special Requests BOTTLES DRAWN AEROBIC AND ANAEROBIC 10CC  Final   Culture NO GROWTH 2 DAYS  Final   Report Status PENDING  Incomplete     Beaulah Dinning, MD 11/01/2014, 6:45 AM PGY-1, [facility] FPTS Intern pager: [phone], text pages welcome

## 2014-11-01 NOTE — Progress Notes (Signed)
[facility]  Date of Admission:  10/26/2014  Antibiotics: Pip/tazo  Subjective: Does not verbalize anything  Objective: Temp:  [97.7 F (36.5 C)-98.2 F (36.8 C)] 98.1 F (36.7 C) (07/18 0521) Pulse Rate:  [90-101] 101 (07/18 0521) Resp:  [16-18] 16 (07/18 0521) BP: (130-152)/(75-94) 152/94 mmHg (07/18 0521) SpO2:  [98 %-99 %] 99 % (07/18 0521)  General: awake, alert, nad Skin: no rashes Lungs: CTA B Cor: RRR Ext: bilateral wound vac around knees  Lab Results Lab Results  Component Value Date   WBC 14.3* 10/27/2014   HGB 8.6* 10/27/2014   HCT 25.3* 10/27/2014   MCV 87.2 10/27/2014   PLT 406* 10/27/2014    Lab Results  Component Value Date   CREATININE 1.18 10/31/2014   BUN 12 10/31/2014   NA 139 10/31/2014   K 3.7 10/31/2014   CL 106 10/31/2014   CO2 27 10/31/2014    Lab Results  Component Value Date   ALT 25 10/26/2014   AST 49* 10/26/2014   ALKPHOS 55 10/26/2014   BILITOT 2.6* 10/26/2014      Microbiology: Recent Results (from the past 240 hour(s))  Blood culture (routine x 2)     Status: None   Collection Time: 10/26/14 12:30 PM  Result Value Ref Range Status   Specimen Description BLOOD RIGHT ARM  Final   Special Requests BOTTLES DRAWN AEROBIC AND ANAEROBIC 5CC  Final   Culture  Setup Time   Final    GRAM POSITIVE COCCI IN CLUSTERS IN BOTH AEROBIC AND ANAEROBIC BOTTLES CRITICAL RESULT CALLED TO, READ BACK BY AND VERIFIED WITH: A.Ricki Miller [id number] 10/27/14 M.CAMPBELL GRAM STAIN REVIEWED-AGREE WITH RESULT S. YARBROUGH    Culture   Final    STAPHYLOCOCCUS AUREUS STAPHYLOCOCCUS SPECIES (COAGULASE NEGATIVE)    Report Status 10/31/2014 FINAL  Final   Organism ID, Bacteria STAPHYLOCOCCUS AUREUS  Final   Organism ID, Bacteria STAPHYLOCOCCUS SPECIES (COAGULASE NEGATIVE)  Final      Susceptibility   Staphylococcus aureus - MIC*    CIPROFLOXACIN <=0.5 SENSITIVE Sensitive     ERYTHROMYCIN <=0.25 SENSITIVE Sensitive    GENTAMICIN <=0.5 SENSITIVE Sensitive     OXACILLIN 0.5 SENSITIVE Sensitive     TETRACYCLINE <=1 SENSITIVE Sensitive     VANCOMYCIN <=0.5 SENSITIVE Sensitive     TRIMETH/SULFA <=10 SENSITIVE Sensitive     CLINDAMYCIN <=0.25 SENSITIVE Sensitive     RIFAMPIN <=0.5 SENSITIVE Sensitive     Inducible Clindamycin NEGATIVE Sensitive     * STAPHYLOCOCCUS AUREUS   Staphylococcus species (coagulase negative) - MIC*    CIPROFLOXACIN <=0.5 SENSITIVE Sensitive     ERYTHROMYCIN <=0.25 SENSITIVE Sensitive     GENTAMICIN <=0.5 SENSITIVE Sensitive     OXACILLIN <=0.25 SENSITIVE Sensitive     TETRACYCLINE <=1 SENSITIVE Sensitive     VANCOMYCIN <=0.5 SENSITIVE Sensitive     TRIMETH/SULFA <=10 SENSITIVE Sensitive     CLINDAMYCIN <=0.25 SENSITIVE Sensitive     RIFAMPIN <=0.5 SENSITIVE Sensitive     Inducible Clindamycin NEGATIVE Sensitive     * STAPHYLOCOCCUS SPECIES (COAGULASE NEGATIVE)  Blood culture (routine x 2)     Status: None   Collection Time: 10/26/14 12:30 PM  Result Value Ref Range Status   Specimen Description BLOOD LEFT ARM  Final   Special Requests BOTTLES DRAWN AEROBIC AND ANAEROBIC 5CC  Final   Culture  Setup Time   Final    GRAM POSITIVE COCCI IN CLUSTERS AEROBIC BOTTLE ONLY CRITICAL RESULT CALLED TO, READ  BACK BY AND VERIFIED WITH: A.Ricki MillerSAUNDERS,RN [id number] 10/27/14 M.CAMPBELL GRAM STAIN REVIEWED-AGREE WITH RESULT S. YARBROUGH    Culture   Final    STAPHYLOCOCCUS SPECIES (COAGULASE NEGATIVE) SUSCEPTIBILITIES PERFORMED ON PREVIOUS CULTURE WITHIN THE LAST 5 DAYS.    Report Status 10/31/2014 FINAL  Final  Body fluid culture     Status: None (Preliminary result)   Collection Time: 10/26/14  5:49 PM  Result Value Ref Range Status   Specimen Description SYNOVIAL LEFT KNEE  Final   Special Requests FLUID ON A SWAB  Final   Gram Stain   Final    FEW WBC PRESENT,BOTH PMN AND MONONUCLEAR NO ORGANISMS SEEN    Culture   Final    STAPHYLOCOCCUS AUREUS SUSCEPTIBILITIES PERFORMED ON PREVIOUS  CULTURE WITHIN THE LAST 5 DAYS. STREPTOCOCCUS GROUP D;high probability for S.bovis SUSCEPTIBILITIES TO FOLLOW    Report Status PENDING  Incomplete  Anaerobic culture     Status: None (Preliminary result)   Collection Time: 10/26/14  5:49 PM  Result Value Ref Range Status   Specimen Description SYNOVIAL LEFT KNEE  Final   Special Requests A FLUID ON SWAB  Final   Gram Stain   Final    FEW WBC PRESENT,BOTH PMN AND MONONUCLEAR NO ORGANISMS SEEN    Culture   Final    NO ANAEROBES ISOLATED; CULTURE IN PROGRESS FOR 5 DAYS   Report Status PENDING  Incomplete  Fungus Culture with Smear     Status: None (Preliminary result)   Collection Time: 10/26/14  5:49 PM  Result Value Ref Range Status   Specimen Description SYNOVIAL LEFT KNEE  Final   Special Requests A FLUID ON SWAB  Final   Fungal Smear   Final    NO YEAST OR FUNGAL ELEMENTS SEEN [facility]formed [facility]    Culture   Final    CULTURE IN PROGRESS FOR FOUR WEEKS Performed at [facility]    Report Status PENDING  Incomplete  AFB culture with smear     Status: None (Preliminary result)   Collection Time: 10/26/14  5:49 PM  Result Value Ref Range Status   Specimen Description SYNOVIAL LEFT KNEE  Final   Special Requests A FLUID ON SWAB  Final   Acid Fast Smear   Final    NO ACID FAST BACILLI SEEN Performed at [facility]    Culture   Final    CULTURE WILL BE EXAMINED FOR 6 WEEKS BEFORE ISSUING A FINAL REPORT Performed at [facility]    Report Status PENDING  Incomplete  Anaerobic culture     Status: None (Preliminary result)   Collection Time: 10/26/14  5:51 PM  Result Value Ref Range Status   Specimen Description SYNOVIAL RIGHT KNEE  Final   Special Requests B FLUID ON SWAB  Final   Gram Stain   Final    FEW WBC PRESENT,BOTH PMN AND MONONUCLEAR NO ORGANISMS SEEN    Culture   Final    NO ANAEROBES ISOLATED; CULTURE IN PROGRESS FOR 5 DAYS   Report Status PENDING  Incomplete  Body  fluid culture     Status: None   Collection Time: 10/26/14  5:51 PM  Result Value Ref Range Status   Specimen Description SYNOVIAL RIGHT KNEE  Final   Special Requests B FLUID ON SWAB  Final   Gram Stain   Final    FEW WBC PRESENT,BOTH PMN AND MONONUCLEAR NO ORGANISMS SEEN    Culture NO GROWTH 3 DAYS  Final   Report Status 10/30/2014 FINAL  Final  Fungus Culture with Smear     Status: None (Preliminary result)   Collection Time: 10/26/14  5:51 PM  Result Value Ref Range Status   Specimen Description SYNOVIAL RIGHT KNEE  Final   Special Requests B FLUID ON SWAB  Final   Fungal Smear   Final    NO YEAST OR FUNGAL ELEMENTS SEEN Performed at [facility]    Culture   Final    CULTURE IN PROGRESS FOR FOUR WEEKS Performed at [facility]    Report Status PENDING  Incomplete  AFB culture with smear     Status: None (Preliminary result)   Collection Time: 10/26/14  5:51 PM  Result Value Ref Range Status   Specimen Description SYNOVIAL RIGHT KNEE  Final   Special Requests B FLUID ON SWAB  Final   Acid Fast Smear   Final    NO ACID FAST BACILLI SEEN Performed at [facility]    Culture   Final    CULTURE WILL BE EXAMINED FOR 6 WEEKS BEFORE ISSUING A FINAL REPORT Performed at [facility]    Report Status PENDING  Incomplete  AFB culture with smear     Status: None (Preliminary result)   Collection Time: 10/26/14  5:51 PM  Result Value Ref Range Status   Specimen Description TISSUE RIGHT KNEE  Final   Special Requests D PT ON ZOSYN VANCOMYCIN  Final   Acid Fast Smear   Final    NO ACID FAST BACILLI SEEN Performed at [facility]    Culture   Final    CULTURE WILL BE EXAMINED FOR 6 WEEKS BEFORE ISSUING A FINAL REPORT Performed at [facility]    Report Status PENDING  Incomplete  Anaerobic culture     Status: None   Collection Time: 10/26/14  5:55 PM  Result Value Ref Range Status   Specimen Description TISSUE LEFT KNEE   Final   Special Requests C  Final   Gram Stain   Final    NO WBC SEEN FEW GRAM POSITIVE COCCI IN PAIRS FEW GRAM POSITIVE RODS Performed at [facility]    Culture   Final    NO ANAEROBES ISOLATED Performed at [facility]    Report Status 10/31/2014 FINAL  Final  Tissue culture     Status: None   Collection Time: 10/26/14  5:55 PM  Result Value Ref Range Status   Specimen Description TISSUE LEFT KNEE  Final   Special Requests C  Final   Gram Stain   Final    NO WBC SEEN FEW GRAM POSITIVE COCCI IN PAIRS FEW GRAM POSITIVE RODS Performed at [facility]    Culture   Final    ABUNDANT STAPHYLOCOCCUS AUREUS Note: RIFAMPIN AND GENTAMICIN SHOULD NOT BE USED AS SINGLE DRUGS FOR TREATMENT OF STAPH INFECTIONS. Performed at [facility]    Report Status 10/30/2014 FINAL  Final   Organism ID, Bacteria STAPHYLOCOCCUS AUREUS  Final      Susceptibility   Staphylococcus aureus - MIC*    CLINDAMYCIN <=0.25 SENSITIVE Sensitive     ERYTHROMYCIN <=0.25 SENSITIVE Sensitive     GENTAMICIN <=0.5 SENSITIVE Sensitive     LEVOFLOXACIN 0.25 SENSITIVE Sensitive     OXACILLIN 0.5 SENSITIVE Sensitive     PENICILLIN >=0.5 RESISTANT Resistant     RIFAMPIN <=0.5 SENSITIVE Sensitive     TRIMETH/SULFA <=10 SENSITIVE Sensitive  VANCOMYCIN 1 SENSITIVE Sensitive     TETRACYCLINE <=1 SENSITIVE Sensitive     MOXIFLOXACIN <=0.25 SENSITIVE Sensitive     * ABUNDANT STAPHYLOCOCCUS AUREUS  Fungus Culture with Smear     Status: None (Preliminary result)   Collection Time: 10/26/14  5:55 PM  Result Value Ref Range Status   Specimen Description TISSUE LEFT KNEE  Final   Special Requests C  Final   Fungal Smear   Final    NO YEAST OR FUNGAL ELEMENTS SEEN Performed at [facility]    Culture   Final    CULTURE IN PROGRESS FOR FOUR WEEKS Performed at [facility]    Report Status PENDING  Incomplete  AFB culture with smear     Status: None (Preliminary  result)   Collection Time: 10/26/14  5:55 PM  Result Value Ref Range Status   Specimen Description TISSUE LEFT KNEE  Final   Special Requests C  Final   Acid Fast Smear   Final    NO ACID FAST BACILLI SEEN Performed at [facility]    Culture   Final    CULTURE WILL BE EXAMINED FOR 6 WEEKS BEFORE ISSUING A FINAL REPORT Performed at [facility]    Report Status PENDING  Incomplete  Anaerobic culture     Status: None   Collection Time: 10/26/14  5:57 PM  Result Value Ref Range Status   Specimen Description TISSUE RIGHT KNEE  Final   Special Requests D PT ON ZOSYN AND VANCOMYCIN  Final   Gram Stain   Final    NO WBC SEEN NO ORGANISMS SEEN Performed at [facility]    Culture   Final    NO ANAEROBES ISOLATED Performed at [facility]    Report Status 11/01/2014 FINAL  Final  Fungus Culture with Smear     Status: None (Preliminary result)   Collection Time: 10/26/14  5:57 PM  Result Value Ref Range Status   Specimen Description TISSUE RIGHT KNEE  Final   Special Requests D PT ON ZOSYN VANCOMYCIN  Final   Fungal Smear   Final    NO YEAST OR FUNGAL ELEMENTS SEEN Performed at [facility]    Culture   Final    CULTURE IN PROGRESS FOR FOUR WEEKS Performed at [facility]    Report Status PENDING  Incomplete  Tissue culture     Status: None   Collection Time: 10/26/14  5:57 PM  Result Value Ref Range Status   Specimen Description TISSUE RIGHT KNEE  Final   Special Requests D PT ON ZOSYN VANCOMYCIN  Final   Gram Stain   Final    NO WBC SEEN NO ORGANISMS SEEN Performed at [facility]    Culture   Final    FEW PSEUDOMONAS AERUGINOSA MODERATE STAPHYLOCOCCUS AUREUS Note: RIFAMPIN AND GENTAMICIN SHOULD NOT BE USED AS SINGLE DRUGS FOR TREATMENT OF STAPH INFECTIONS. Performed at [facility]    Report Status 10/30/2014 FINAL  Final   Organism ID, Bacteria PSEUDOMONAS AERUGINOSA  Final   Organism ID, Bacteria  STAPHYLOCOCCUS AUREUS  Final      Susceptibility   Staphylococcus aureus - MIC*    CLINDAMYCIN <=0.25 SENSITIVE Sensitive     ERYTHROMYCIN <=0.25 SENSITIVE Sensitive     GENTAMICIN <=0.5 SENSITIVE Sensitive     LEVOFLOXACIN 0.25 SENSITIVE Sensitive     OXACILLIN 0.5 SENSITIVE Sensitive     PENICILLIN >=0.5 RESISTANT Resistant  RIFAMPIN <=0.5 SENSITIVE Sensitive     TRIMETH/SULFA <=10 SENSITIVE Sensitive     VANCOMYCIN <=0.5 SENSITIVE Sensitive     TETRACYCLINE <=1 SENSITIVE Sensitive     MOXIFLOXACIN <=0.25 SENSITIVE Sensitive     * MODERATE STAPHYLOCOCCUS AUREUS   Pseudomonas aeruginosa - MIC*    CEFEPIME <=1 SENSITIVE Sensitive     CEFTAZIDIME 2 SENSITIVE Sensitive     CIPROFLOXACIN <=0.25 SENSITIVE Sensitive     GENTAMICIN <=1 SENSITIVE Sensitive     IMIPENEM 2 SENSITIVE Sensitive     PIP/TAZO <=4 SENSITIVE Sensitive     TOBRAMYCIN <=1 SENSITIVE Sensitive     * FEW PSEUDOMONAS AERUGINOSA  Surgical pcr screen     Status: Abnormal   Collection Time: 10/29/14  6:25 AM  Result Value Ref Range Status   MRSA, PCR NEGATIVE NEGATIVE Final   Staphylococcus aureus POSITIVE (A) NEGATIVE Final    Comment:        The Xpert SA Assay (FDA approved for NASAL specimens in patients over 50 years of age), is one component of a comprehensive surveillance program.  Test performance has been validated by [facility] for patients greater than or equal to 29 year old. It is not intended to diagnose infection nor to guide or monitor treatment.   Culture, blood (routine x 2)     Status: None (Preliminary result)   Collection Time: 10/29/14  6:35 PM  Result Value Ref Range Status   Specimen Description BLOOD LEFT ANTECUBITAL  Final   Special Requests BOTTLES DRAWN AEROBIC AND ANAEROBIC 10CC  Final   Culture NO GROWTH 2 DAYS  Final   Report Status PENDING  Incomplete  Culture, blood (routine x 2)     Status: None (Preliminary result)   Collection Time: 10/29/14  6:45 PM  Result Value Ref  Range Status   Specimen Description BLOOD LEFT HAND  Final   Special Requests BOTTLES DRAWN AEROBIC AND ANAEROBIC 10CC  Final   Culture NO GROWTH 2 DAYS  Final   Report Status PENDING  Incomplete    Studies/Results: No results found.  Assessment/Plan:  1) MSSA bacteremia - on zosyn due to polymicrobial joint infection.  I would do TEE to determine length of IV antibiotics and may consider transition to po if no endocarditis.  2) polymicrobial infection of both thighs and knees - VAC in place and plastic surgery to see.  Continue with zosyn as above.    Staci Righter, MD [facility] [email or web address] [phone] pager   [phone] cell 11/01/2014, 11:53 AM

## 2014-11-01 NOTE — Op Note (Addendum)
   Date of Surgery: 10/29/2014  INDICATIONS: Mr. Timothy Norman is a 64 y.o.-year-old male who sustained a bilateral thigh wounds who returns today for a repeat I&D;  The patient did consent to the procedure after discussion of the risks and benefits.  PREOPERATIVE DIAGNOSIS: Bilateral thigh wounds status post debridement and VAC  POSTOPERATIVE DIAGNOSIS: Same.  PROCEDURE:  1. Debridement of muscle, subcutaneous tissue, skin of left leg 20 x 12 2. Debridement of muscle, subcutaneous tissue, skin of right leg 7 x 7  3. Wound vac placement >50 sq cm  SURGEON: N. Glee ArvinMichael Xu, M.D.  ASSIST: none.  ANESTHESIA:  general  IV FLUIDS AND URINE: See anesthesia.  ESTIMATED BLOOD LOSS: minimal mL.  IMPLANTS: none  DRAINS: 2 wound vac  COMPLICATIONS: None.  DESCRIPTION OF PROCEDURE: The patient was brought to the operating room and placed supine on the operating table.  The patient had been signed prior to the procedure and this was documented. The patient had the anesthesia placed by the anesthesiologist.  A time-out was performed to confirm that this was the correct patient, site, side and location. The patient did receive antibiotics prior to the incision and was re-dosed during the procedure as needed at indicated intervals.  The patient had the operative extremities prepped and draped in the standard surgical fashion.    We performed sharp excisional debridement of the muscle, skin, subctuaneous tissue of both thighs with a knife.  There was good bleeding tissue after debridement.  The wounds were then thoroughly irrigated with normal saline.  The wounds were dressed with wound vacs.  Patient tolerated the procedure well.  POSTOPERATIVE PLAN: I have asked Dr. Kelly SplinterSanger of plastic surgery to evaluated the patient for his wounds.  She will see him Monday.    Mayra ReelN. Michael Xu, MD [facility] [phone] 7:32 AM

## 2014-11-01 NOTE — Progress Notes (Signed)
I have contacted Dr. Kelly SplinterSanger of plastics to evaluate the patient for his wounds and further management of them.  She will plan on seeing him on Monday.  Appreciate her assistance with this case.  Orthopedics will sign off.  Please call with questions  N. Glee ArvinMichael Abraham Margulies, MD [facility] [phone] 7:37 AM

## 2014-11-01 NOTE — Progress Notes (Signed)
[facility] and they have wound vacs at the facility, so [facility] will not need to get one approved, this order has been canceled for [facility], since facility has wound vacs there.

## 2014-11-01 NOTE — Progress Notes (Signed)
Physical Therapy Treatment Patient Details Name: Timothy Norman MRN: [id number] DOB: 12-22-1950 Today's Date: 11/01/2014    History of Present Illness Pt adm with severe bil thigh wounds and lt septic knee. Pt with maggots in wounds. Pt underwent surgical debridement of wounds and VAC placement 10/28/14. Pt also with AMS. PMH - etoh abuse.    PT Comments    Patient continues to have difficulty mobilizing BLEs due to pain. Not able to flex knees today to get to EOB. Tolerated A-P transfer to chair with Mod A of 2 and mod instructional cues. Seems motivated to regain functional strength and happy to be sitting in chair. Appropriate for ST SN[facility]. Will continue to follow per current POC.   Follow Up Recommendations  SNF     Equipment Recommendations  Other (comment) (TBD.)    Recommendations for Other Services       Precautions / Restrictions Precautions Precautions: Fall Restrictions Weight Bearing Restrictions: No    Mobility  Bed Mobility Overal bed mobility: Needs Assistance       Supine to sit: Max assist;+2 for physical assistance     General bed mobility comments: Pt unable to tolerate bringing LE's off EOB to sit EOB. Pt able to assist pulling trunk forward to long sit.   Transfers Overall transfer level: Needs assistance   Transfers: Licensed conveyancer transfers: Mod assist;+2 physical assistance   General transfer comment: Mod A of 2 to perform A-P transfer to chair with cues for technique. Used chuck pad to mobilize hips and 1 therapist stabilizing BLEs.  Ambulation/Gait                 Stairs            Wheelchair Mobility    Modified Rankin (Stroke Patients Only)       Balance Overall balance assessment: Needs assistance     Sitting balance - Comments: Not able to tolerate bringing BLEs to EOB to sit up. Not able to flex knees due to pain.                            Cognition  Arousal/Alertness: Awake/alert Behavior During Therapy: WFL for tasks assessed/performed Overall Cognitive Status: No family/caregiver present to determine baseline cognitive functioning Area of Impairment: Problem solving Orientation Level:  (A&Ox3 with cues.)           Problem Solving: Slow processing;Requires verbal cues      Exercises General Exercises - Lower Extremity Ankle Circles/Pumps: Both;10 reps;Supine    General Comments        Pertinent Vitals/Pain Pain Assessment: Faces Faces Pain Scale: Hurts worst Pain Location: BLEs Pain Descriptors / Indicators: Grimacing;Moaning (yelling out in pain, "noo") Pain Intervention(s): Limited activity within patient's tolerance;Monitored during session;Repositioned    Home Living                      Prior Function            PT Goals (current goals can now be found in the care plan section) Progress towards PT goals: Progressing toward goals    Frequency  Min 3X/week    PT Plan Current plan remains appropriate    Co-evaluation             End of Session   Activity Tolerance: Patient limited by pain Patient left: in chair;with call bell/phone within reach;with chair alarm  set     Time: [id number] PT Time Calculation (min) (ACUTE ONLY): 20 min  Charges:  $Therapeutic Activity: 8-22 mins                    G Codes:      Corynne Scibilia A Curran Lenderman 11/01/2014, 10:13 AM Mylo RedShauna Cady Hafen, PT, DPT [phone]

## 2014-11-02 LAB — ANAEROBIC CULTURE

## 2014-11-02 MED ORDER — PIPERACILLIN-TAZOBACTAM 3.375 G IVPB
3.3750 g | Freq: Three times a day (TID) | INTRAVENOUS | Status: DC
  Administered 2014-11-03 – 2014-11-05 (×9): 3.375 g via INTRAVENOUS
  Filled 2014-11-02 (×11): qty 50

## 2014-11-02 MED ORDER — OXYCODONE HCL 5 MG PO TABS
5.0000 mg | ORAL_TABLET | Freq: Four times a day (QID) | ORAL | Status: DC
  Administered 2014-11-02 – 2014-11-05 (×6): 5 mg via ORAL
  Filled 2014-11-02 (×11): qty 1

## 2014-11-02 NOTE — Progress Notes (Signed)
    [facility] has been requested to perform a transesophageal echocardiogram on Mr. Timothy Norman on 11/03/2014 for SBE.  After careful review of history and examination, the risks and benefits of transesophageal echocardiogram have been explained including risks of esophageal damage, perforation (1:10,000 risk), bleeding, pharyngeal hematoma as well as other potential complications associated with conscious sedation including aspiration, arrhythmia, respiratory failure and death. Alternatives to treatment were discussed, questions were answered.   At this time, the patient is not sure that he is willing to proceed.  He says that he will like to talk it over with a family member.  He is tentatively scheduled for 12:00 PM on Wed 7/20.  Please notify our team if the patient decides to cancel so that we may discontinue pre-TEE orders.  Nicolasa Duckinghristopher Lashanti Chambless, NP 11/02/2014 7:18 PM

## 2014-11-02 NOTE — Progress Notes (Signed)
Patient was complaining of worsening leg pain today, has not been getting PRN Morphine for the last 2 days. Unsure if patient has the capacity to request medication. Will schedule oxycodone q 6 for pain management.

## 2014-11-02 NOTE — Progress Notes (Signed)
Family Medicine Teaching Service Daily Progress Note Intern Pager: [phone]  Patient name: Timothy Norman Medical record number: [id number] Date of birth: 12-23-1950 Age: 64 y.o. Gender: male  Primary Care Provider: No primary care provider on file. Consultants: Ortho, ID Code Status: FuLL  Pt Overview and Major Events to Date:  07/12: Admitted to floor, Ortho debrided legs in OR 07/13: Wound VAC in place BL LE, ID consult - SSI moderate - 10 units yesterday Assessment and Plan: Timothy Norman is a 64 y.o. male presenting with severe bilateral necrotic leg wounds. PMH is significant for alcohol abuse.   Bilateral Necrotic Leg Wounds: Patient reports that wound started after a fall the week prior to admission, however appeared to be infected for months and maggots were removed from the wounds upon presentation to the ED. Patient is now s/p significant debridement and bilateral knee washout. Blood cultures have grown MSSA and wound cultures have grown pseudomonas.   -Vanc (7/12-7/14), Zosyn (7/12- ) -PT/OT consulted, recommended SNF placement -Pain control with tylenol and IV morphine 4mg  q4hrs prn -Orthopedics debrided and I/D, signed off on 7/18 -Per ortho note, plastics to see patient Monday (7/25?) for possible skin grafts and further wound management -Will need to see who is managing wound Vac until plastics is on board -Wheelchair with supervision ordered so patient can leave room   Bacteremia; 2/2 Blood cultures positive for MSSA. HIV, RPR, hepatitis panel negative.  -ID consulted, appreciate assistance -Abx as above -TTE with preserved EF, no obvious endocarditis.  -f/u on TEE (will be done on 7/20) -Repeat blood cultures (7/15) pending   AMS/Cognitive Impairment. Patient noted to have abnormal behavior in the ED. Ethanol negative. UDS only positive for benzos and opiates which patient received in ED. LFTs unremarkable. Psychiatric history unknown, possible schizophrenia.   Friend JB reports that this is the patient's baseline mental status. 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  -Monitor mental status -Call family member to get more information if needed, name is Timothy Norman (cousin): [phone]  Alcohol Abuse; Chronic -social work consulted -CIWA protocol discontinued - Thiamine, folate, multivitamin.   Malnutrition. Albumin 2.9 on admission. - Nutrition consulted, appreciate assistance.   FEN/GI: SLIV, regular PPx: Heparin  Disposition:  Anticipate discharge to SNF when medically stable.   Subjective:  Patient is doing well this morning, resting comfortably in bed. He is still seeing cars flying outside his window in the sky and he "waves to the people". He admits to leg pain and says its worse when he moves. He does not remember being in a wheelchair yesterday even though one was ordered, will need to follow up on that. Patient is still very eager to wheel around the hallway just so he can get out of his room.    Objective: Temp:  [98.3 F (36.8 C)-98.4 F (36.9 C)] 98.4 F (36.9 C) (07/19 0535) Pulse Rate:  [89-98] 89 (07/19 0535) Resp:  [20-24] 20 (07/18 2147) BP: (125-169)/(75-80) 125/77 mmHg (07/19 0535) SpO2:  [98 %-100 %] 98 % (07/19 0535) Physical Exam: General: NAD, sitting in bedside chair.  Cardiovascular: RRR, no murmurs Respiratory: clear to auscultation bilaterally, no increased WOB Abdomen: (+) BS, nontender, nondistended Extremities: wound vac in place in bilateral lower extremities, draining serosanginous fluid.  Neuro: alert and oriented. No focal neurologic deficits.  Psych: poor insight. (+) visual hallucinations  Laboratory/Micro/Diagnostic Studies:  Recent Results (from the past 240 hour(s))  Blood culture (routine x 2)     Status: None  Collection Time: 10/26/14 12:30 PM  Result Value Ref Range Status   Specimen Description BLOOD RIGHT ARM  Final   Special Requests BOTTLES DRAWN AEROBIC AND  ANAEROBIC 5CC  Final   Culture  Setup Time   Final    GRAM POSITIVE COCCI IN CLUSTERS IN BOTH AEROBIC AND ANAEROBIC BOTTLES CRITICAL RESULT CALLED TO, READ BACK BY AND VERIFIED WITH: A.Ricki Miller [id number] 10/27/14 M.CAMPBELL GRAM STAIN REVIEWED-AGREE WITH RESULT S. YARBROUGH    Culture   Final    STAPHYLOCOCCUS AUREUS STAPHYLOCOCCUS SPECIES (COAGULASE NEGATIVE)    Report Status 10/31/2014 FINAL  Final   Organism ID, Bacteria STAPHYLOCOCCUS AUREUS  Final   Organism ID, Bacteria STAPHYLOCOCCUS SPECIES (COAGULASE NEGATIVE)  Final      Susceptibility   Staphylococcus aureus - MIC*    CIPROFLOXACIN <=0.5 SENSITIVE Sensitive     ERYTHROMYCIN <=0.25 SENSITIVE Sensitive     GENTAMICIN <=0.5 SENSITIVE Sensitive     OXACILLIN 0.5 SENSITIVE Sensitive     TETRACYCLINE <=1 SENSITIVE Sensitive     VANCOMYCIN <=0.5 SENSITIVE Sensitive     TRIMETH/SULFA <=10 SENSITIVE Sensitive     CLINDAMYCIN <=0.25 SENSITIVE Sensitive     RIFAMPIN <=0.5 SENSITIVE Sensitive     Inducible Clindamycin NEGATIVE Sensitive     * STAPHYLOCOCCUS AUREUS   Staphylococcus species (coagulase negative) - MIC*    CIPROFLOXACIN <=0.5 SENSITIVE Sensitive     ERYTHROMYCIN <=0.25 SENSITIVE Sensitive     GENTAMICIN <=0.5 SENSITIVE Sensitive     OXACILLIN <=0.25 SENSITIVE Sensitive     TETRACYCLINE <=1 SENSITIVE Sensitive     VANCOMYCIN <=0.5 SENSITIVE Sensitive     TRIMETH/SULFA <=10 SENSITIVE Sensitive     CLINDAMYCIN <=0.25 SENSITIVE Sensitive     RIFAMPIN <=0.5 SENSITIVE Sensitive     Inducible Clindamycin NEGATIVE Sensitive     * STAPHYLOCOCCUS SPECIES (COAGULASE NEGATIVE)  Blood culture (routine x 2)     Status: None   Collection Time: 10/26/14 12:30 PM  Result Value Ref Range Status   Specimen Description BLOOD LEFT ARM  Final   Special Requests BOTTLES DRAWN AEROBIC AND ANAEROBIC 5CC  Final   Culture  Setup Time   Final    GRAM POSITIVE COCCI IN CLUSTERS AEROBIC BOTTLE ONLY CRITICAL RESULT CALLED TO, READ BACK BY  AND VERIFIED WITH: A.Ricki Miller [id number] 10/27/14 M.CAMPBELL GRAM STAIN REVIEWED-AGREE WITH RESULT S. YARBROUGH    Culture   Final    STAPHYLOCOCCUS SPECIES (COAGULASE NEGATIVE) SUSCEPTIBILITIES PERFORMED ON PREVIOUS CULTURE WITHIN THE LAST 5 DAYS.    Report Status 10/31/2014 FINAL  Final  Body fluid culture     Status: None (Preliminary result)   Collection Time: 10/26/14  5:49 PM  Result Value Ref Range Status   Specimen Description SYNOVIAL LEFT KNEE  Final   Special Requests FLUID ON A SWAB  Final   Gram Stain   Final    FEW WBC PRESENT,BOTH PMN AND MONONUCLEAR NO ORGANISMS SEEN    Culture   Final    STAPHYLOCOCCUS AUREUS SUSCEPTIBILITIES PERFORMED ON PREVIOUS CULTURE WITHIN THE LAST 5 DAYS. STREPTOCOCCUS GROUP D;high probability for S.bovis SUSCEPTIBILITIES TO FOLLOW    Report Status PENDING  Incomplete  Anaerobic culture     Status: None (Preliminary result)   Collection Time: 10/26/14  5:49 PM  Result Value Ref Range Status   Specimen Description SYNOVIAL LEFT KNEE  Final   Special Requests A FLUID ON SWAB  Final   Gram Stain   Final    FEW WBC PRESENT,BOTH PMN AND MONONUCLEAR  NO ORGANISMS SEEN    Culture HOLDING FOR POSSIBLE ANAEROBE  Final   Report Status PENDING  Incomplete  Fungus Culture with Smear     Status: None (Preliminary result)   Collection Time: 10/26/14  5:49 PM  Result Value Ref Range Status   Specimen Description SYNOVIAL LEFT KNEE  Final   Special Requests A FLUID ON SWAB  Final   Fungal Smear   Final    NO YEAST OR FUNGAL ELEMENTS SEEN Performed at [facility]    Culture   Final    CULTURE IN PROGRESS FOR FOUR WEEKS Performed at [facility]    Report Status PENDING  Incomplete  AFB culture with smear     Status: None (Preliminary result)   Collection Time: 10/26/14  5:49 PM  Result Value Ref Range Status   Specimen Description SYNOVIAL LEFT KNEE  Final   Special Requests A FLUID ON SWAB  Final   Acid Fast Smear   Final     NO ACID FAST BACILLI SEEN Performed at [facility]    Culture   Final    CULTURE WILL BE EXAMINED FOR 6 WEEKS BEFORE ISSUING A FINAL REPORT Performed at [facility]    Report Status PENDING  Incomplete  Anaerobic culture     Status: None (Preliminary result)   Collection Time: 10/26/14  5:51 PM  Result Value Ref Range Status   Specimen Description SYNOVIAL RIGHT KNEE  Final   Special Requests B FLUID ON SWAB  Final   Gram Stain   Final    FEW WBC PRESENT,BOTH PMN AND MONONUCLEAR NO ORGANISMS SEEN    Culture HOLDING FOR POSSIBLE ANAEROBE  Final   Report Status PENDING  Incomplete  Body fluid culture     Status: None   Collection Time: 10/26/14  5:51 PM  Result Value Ref Range Status   Specimen Description SYNOVIAL RIGHT KNEE  Final   Special Requests B FLUID ON SWAB  Final   Gram Stain   Final    FEW WBC PRESENT,BOTH PMN AND MONONUCLEAR NO ORGANISMS SEEN    Culture NO GROWTH 3 DAYS  Final   Report Status 10/30/2014 FINAL  Final  Fungus Culture with Smear     Status: None (Preliminary result)   Collection Time: 10/26/14  5:51 PM  Result Value Ref Range Status   Specimen Description SYNOVIAL RIGHT KNEE  Final   Special Requests B FLUID ON SWAB  Final   Fungal Smear   Final    NO YEAST OR FUNGAL ELEMENTS SEEN Performed at [facility]    Culture   Final    CULTURE IN PROGRESS FOR FOUR WEEKS Performed at [facility]    Report Status PENDING  Incomplete  AFB culture with smear     Status: None (Preliminary result)   Collection Time: 10/26/14  5:51 PM  Result Value Ref Range Status   Specimen Description SYNOVIAL RIGHT KNEE  Final   Special Requests B FLUID ON SWAB  Final   Acid Fast Smear   Final    NO ACID FAST BACILLI SEEN Performed at [facility]    Culture   Final    CULTURE WILL BE EXAMINED FOR 6 WEEKS BEFORE ISSUING A FINAL REPORT Performed at [facility]    Report Status PENDING  Incomplete  AFB culture  with smear     Status: None (Preliminary result)   Collection Time: 10/26/14  5:51 PM  Result Value Ref Range Status   Specimen Description TISSUE RIGHT KNEE  Final   Special Requests D PT ON ZOSYN VANCOMYCIN  Final   Acid Fast Smear   Final    NO ACID FAST BACILLI SEEN Performed at [facility]    Culture   Final    CULTURE WILL BE EXAMINED FOR 6 WEEKS BEFORE ISSUING A FINAL REPORT Performed at [facility]    Report Status PENDING  Incomplete  Anaerobic culture     Status: None   Collection Time: 10/26/14  5:55 PM  Result Value Ref Range Status   Specimen Description TISSUE LEFT KNEE  Final   Special Requests C  Final   Gram Stain   Final    NO WBC SEEN FEW GRAM POSITIVE COCCI IN PAIRS FEW GRAM POSITIVE RODS Performed at [facility]    Culture   Final    NO ANAEROBES ISOLATED Performed at [facility]    Report Status 10/31/2014 FINAL  Final  Tissue culture     Status: None   Collection Time: 10/26/14  5:55 PM  Result Value Ref Range Status   Specimen Description TISSUE LEFT KNEE  Final   Special Requests C  Final   Gram Stain   Final    NO WBC SEEN FEW GRAM POSITIVE COCCI IN PAIRS FEW GRAM POSITIVE RODS Performed at [facility]    Culture   Final    ABUNDANT STAPHYLOCOCCUS AUREUS Note: RIFAMPIN AND GENTAMICIN SHOULD NOT BE USED AS SINGLE DRUGS FOR TREATMENT OF STAPH INFECTIONS. Performed at [facility]    Report Status 10/30/2014 FINAL  Final   Organism ID, Bacteria STAPHYLOCOCCUS AUREUS  Final      Susceptibility   Staphylococcus aureus - MIC*    CLINDAMYCIN <=0.25 SENSITIVE Sensitive     ERYTHROMYCIN <=0.25 SENSITIVE Sensitive     GENTAMICIN <=0.5 SENSITIVE Sensitive     LEVOFLOXACIN 0.25 SENSITIVE Sensitive     OXACILLIN 0.5 SENSITIVE Sensitive     PENICILLIN >=0.5 RESISTANT Resistant     RIFAMPIN <=0.5 SENSITIVE Sensitive     TRIMETH/SULFA <=10 SENSITIVE Sensitive     VANCOMYCIN 1 SENSITIVE Sensitive      TETRACYCLINE <=1 SENSITIVE Sensitive     MOXIFLOXACIN <=0.25 SENSITIVE Sensitive     * ABUNDANT STAPHYLOCOCCUS AUREUS  Fungus Culture with Smear     Status: None (Preliminary result)   Collection Time: 10/26/14  5:55 PM  Result Value Ref Range Status   Specimen Description TISSUE LEFT KNEE  Final   Special Requests C  Final   Fungal Smear   Final    NO YEAST OR FUNGAL ELEMENTS SEEN Performed at [facility]    Culture   Final    CULTURE IN PROGRESS FOR FOUR WEEKS Performed at [facility]    Report Status PENDING  Incomplete  AFB culture with smear     Status: None (Preliminary result)   Collection Time: 10/26/14  5:55 PM  Result Value Ref Range Status   Specimen Description TISSUE LEFT KNEE  Final   Special Requests C  Final   Acid Fast Smear   Final    NO ACID FAST BACILLI SEEN Performed at [facility]    Culture   Final    CULTURE WILL BE EXAMINED FOR 6 WEEKS BEFORE ISSUING A FINAL REPORT Performed at [facility]    Report Status PENDING  Incomplete  Anaerobic culture  Status: None   Collection Time: 10/26/14  5:57 PM  Result Value Ref Range Status   Specimen Description TISSUE RIGHT KNEE  Final   Special Requests D PT ON ZOSYN AND VANCOMYCIN  Final   Gram Stain   Final    NO WBC SEEN NO ORGANISMS SEEN [facility]    Culture   Final    NO ANAEROBES ISOLATED [facility]    Report Status 11/01/2014 FINAL  Final  Fungus Culture with Smear     Status: None (Preliminary result)   Collection Time: 10/26/14  5:57 PM  Result Value Ref Range Status   Specimen Description TISSUE RIGHT KNEE  Final   Special Requests D PT ON ZOSYN VANCOMYCIN  Final   Fungal Smear   Final    NO YEAST OR FUNGAL ELEMENTS SEEN [facility]    Culture   Final    CULTURE IN PROGRESS FOR FOUR WEEKS [facility]formed at [facility]    Report Status PENDING  Incomplete  Tissue culture      Status: None   Collection Time: 10/26/14  5:57 PM  Result Value Ref Range Status   Specimen Description TISSUE RIGHT KNEE  Final   Special Requests D PT ON ZOSYN VANCOMYCIN  Final   Gram Stain   Final    NO WBC SEEN NO ORGANISMS SEEN [facility]    Culture   Final    FEW PSEUDOMONAS AERUGINOSA MODERATE STAPHYLOCOCCUS AUREUS Note: RIFAMPIN AND GENTAMICIN SHOULD NOT BE USED AS SINGLE DRUGS FOR TREATMENT OF STAPH INFECTIONS. Performed at [facility]    Report Status 10/30/2014 FINAL  Final   Organism ID, Bacteria PSEUDOMONAS AERUGINOSA  Final   Organism ID, Bacteria STAPHYLOCOCCUS AUREUS  Final      Susceptibility   Staphylococcus aureus - MIC*    CLINDAMYCIN <=0.25 SENSITIVE Sensitive     ERYTHROMYCIN <=0.25 SENSITIVE Sensitive     GENTAMICIN <=0.5 SENSITIVE Sensitive     LEVOFLOXACIN 0.25 SENSITIVE Sensitive     OXACILLIN 0.5 SENSITIVE Sensitive     PENICILLIN >=0.5 RESISTANT Resistant     RIFAMPIN <=0.5 SENSITIVE Sensitive     TRIMETH/SULFA <=10 SENSITIVE Sensitive     VANCOMYCIN <=0.5 SENSITIVE Sensitive     TETRACYCLINE <=1 SENSITIVE Sensitive     MOXIFLOXACIN <=0.25 SENSITIVE Sensitive     * MODERATE STAPHYLOCOCCUS AUREUS   Pseudomonas aeruginosa - MIC*    CEFEPIME <=1 SENSITIVE Sensitive     CEFTAZIDIME 2 SENSITIVE Sensitive     CIPROFLOXACIN <=0.25 SENSITIVE Sensitive     GENTAMICIN <=1 SENSITIVE Sensitive     IMIPENEM 2 SENSITIVE Sensitive     PIP/TAZO <=4 SENSITIVE Sensitive     TOBRAMYCIN <=1 SENSITIVE Sensitive     * FEW PSEUDOMONAS AERUGINOSA  Surgical pcr screen     Status: Abnormal   Collection Time: 10/29/14  6:25 AM  Result Value Ref Range Status   MRSA, PCR NEGATIVE NEGATIVE Final   Staphylococcus aureus POSITIVE (A) NEGATIVE Final    Comment:        The Xpert SA Assay (FDA approved for NASAL specimens in patients over 64 years of age), is one component of a comprehensive surveillance program.  Test performance  has been validated by [facility] for patients greater than or equal to 64 year old. It is not intended to diagnose infection nor to guide or monitor treatment.   Culture, blood (routine x  2)     Status: None (Preliminary result)   Collection Time: 10/29/14  6:35 PM  Result Value Ref Range Status   Specimen Description BLOOD LEFT ANTECUBITAL  Final   Special Requests BOTTLES DRAWN AEROBIC AND ANAEROBIC 10CC  Final   Culture NO GROWTH 3 DAYS  Final   Report Status PENDING  Incomplete  Culture, blood (routine x 2)     Status: None (Preliminary result)   Collection Time: 10/29/14  6:45 PM  Result Value Ref Range Status   Specimen Description BLOOD LEFT HAND  Final   Special Requests BOTTLES DRAWN AEROBIC AND ANAEROBIC 10CC  Final   Culture NO GROWTH 3 DAYS  Final   Report Status PENDING  Incomplete     Beaulah Dinning, MD 11/02/2014, 7:13 AM PGY-1, [facility] FPTS Intern pager: [phone], text pages welcome

## 2014-11-02 NOTE — Interval H&P Note (Signed)
History and Physical Interval Note:  11/02/2014 1:37 PM  Timothy Norman  has presented today for surgery, with the diagnosis of bacteremia  The various methods of treatment have been discussed with the patient and family. After consideration of risks, benefits and other options for treatment, the patient has consented to  Procedure(s): TRANSESOPHAGEAL ECHOCARDIOGRAM (TEE) (N/A) as a surgical intervention .  The patient's history has been reviewed, patient examined, no change in status, stable for surgery.  I have reviewed the patient's chart and labs.  Questions were answered to the patient's satisfaction.     Charlton HawsPeter Nethra Mehlberg

## 2014-11-02 NOTE — Progress Notes (Signed)
[facility]  Date of Admission:  10/26/2014  Antibiotics: Pip/tazo  Subjective: Does not verbalize anything to me  Objective: Temp:  [98.3 F (36.8 C)-98.4 F (36.9 C)] 98.4 F (36.9 C) (07/19 0535) Pulse Rate:  [89-98] 89 (07/19 0535) Resp:  [20] 20 (07/18 2147) BP: (125-169)/(77-80) 125/77 mmHg (07/19 0535) SpO2:  [98 %-100 %] 98 % (07/19 0535)  General: awake, alert, nad Skin: no rashes Lungs: CTA B Cor: RRR Ext: bilateral wound vac around knees  Lab Results Lab Results  Component Value Date   WBC 14.3* 10/27/2014   HGB 8.6* 10/27/2014   HCT 25.3* 10/27/2014   MCV 87.2 10/27/2014   PLT 406* 10/27/2014    Lab Results  Component Value Date   CREATININE 1.18 10/31/2014   BUN 12 10/31/2014   NA 139 10/31/2014   K 3.7 10/31/2014   CL 106 10/31/2014   CO2 27 10/31/2014    Lab Results  Component Value Date   ALT 25 10/26/2014   AST 49* 10/26/2014   ALKPHOS 55 10/26/2014   BILITOT 2.6* 10/26/2014      Microbiology: Recent Results (from the past 240 hour(s))  Blood culture (routine x 2)     Status: None   Collection Time: 10/26/14 12:30 PM  Result Value Ref Range Status   Specimen Description BLOOD RIGHT ARM  Final   Special Requests BOTTLES DRAWN AEROBIC AND ANAEROBIC 5CC  Final   Culture  Setup Time   Final    GRAM POSITIVE COCCI IN CLUSTERS IN BOTH AEROBIC AND ANAEROBIC BOTTLES CRITICAL RESULT CALLED TO, READ BACK BY AND VERIFIED WITH: A.Ricki Miller 1610 10/27/14 M.CAMPBELL GRAM STAIN REVIEWED-AGREE WITH RESULT S. YARBROUGH    Culture   Final    STAPHYLOCOCCUS AUREUS STAPHYLOCOCCUS SPECIES (COAGULASE NEGATIVE)    Report Status 10/31/2014 FINAL  Final   Organism ID, Bacteria STAPHYLOCOCCUS AUREUS  Final   Organism ID, Bacteria STAPHYLOCOCCUS SPECIES (COAGULASE NEGATIVE)  Final      Susceptibility   Staphylococcus aureus - MIC*    CIPROFLOXACIN <=0.5 SENSITIVE Sensitive     ERYTHROMYCIN <=0.25 SENSITIVE Sensitive    GENTAMICIN <=0.5 SENSITIVE Sensitive     OXACILLIN 0.5 SENSITIVE Sensitive     TETRACYCLINE <=1 SENSITIVE Sensitive     VANCOMYCIN <=0.5 SENSITIVE Sensitive     TRIMETH/SULFA <=10 SENSITIVE Sensitive     CLINDAMYCIN <=0.25 SENSITIVE Sensitive     RIFAMPIN <=0.5 SENSITIVE Sensitive     Inducible Clindamycin NEGATIVE Sensitive     * STAPHYLOCOCCUS AUREUS   Staphylococcus species (coagulase negative) - MIC*    CIPROFLOXACIN <=0.5 SENSITIVE Sensitive     ERYTHROMYCIN <=0.25 SENSITIVE Sensitive     GENTAMICIN <=0.5 SENSITIVE Sensitive     OXACILLIN <=0.25 SENSITIVE Sensitive     TETRACYCLINE <=1 SENSITIVE Sensitive     VANCOMYCIN <=0.5 SENSITIVE Sensitive     TRIMETH/SULFA <=10 SENSITIVE Sensitive     CLINDAMYCIN <=0.25 SENSITIVE Sensitive     RIFAMPIN <=0.5 SENSITIVE Sensitive     Inducible Clindamycin NEGATIVE Sensitive     * STAPHYLOCOCCUS SPECIES (COAGULASE NEGATIVE)  Blood culture (routine x 2)     Status: None   Collection Time: 10/26/14 12:30 PM  Result Value Ref Range Status   Specimen Description BLOOD LEFT ARM  Final   Special Requests BOTTLES DRAWN AEROBIC AND ANAEROBIC 5CC  Final   Culture  Setup Time   Final    GRAM POSITIVE COCCI IN CLUSTERS AEROBIC BOTTLE ONLY CRITICAL RESULT CALLED  TO, READ BACK BY AND VERIFIED WITH: A.Ricki Miller 1610 10/27/14 M.CAMPBELL GRAM STAIN REVIEWED-AGREE WITH RESULT S. YARBROUGH    Culture   Final    STAPHYLOCOCCUS SPECIES (COAGULASE NEGATIVE) SUSCEPTIBILITIES PERFORMED ON PREVIOUS CULTURE WITHIN THE LAST 5 DAYS.    Report Status 10/31/2014 FINAL  Final  Body fluid culture     Status: None (Preliminary result)   Collection Time: 10/26/14  5:49 PM  Result Value Ref Range Status   Specimen Description SYNOVIAL LEFT KNEE  Final   Special Requests FLUID ON A SWAB  Final   Gram Stain   Final    FEW WBC PRESENT,BOTH PMN AND MONONUCLEAR NO ORGANISMS SEEN    Culture   Final    STAPHYLOCOCCUS AUREUS SUSCEPTIBILITIES PERFORMED ON PREVIOUS  CULTURE WITHIN THE LAST 5 DAYS. STREPTOCOCCUS GROUP D;high probability for S.bovis SUSCEPTIBILITIES TO FOLLOW    Report Status PENDING  Incomplete  Anaerobic culture     Status: None   Collection Time: 10/26/14  5:49 PM  Result Value Ref Range Status   Specimen Description SYNOVIAL LEFT KNEE  Final   Special Requests A FLUID ON SWAB  Final   Gram Stain   Final    FEW WBC PRESENT,BOTH PMN AND MONONUCLEAR NO ORGANISMS SEEN    Culture NO ANAEROBES ISOLATED  Final   Report Status 11/02/2014 FINAL  Final  Fungus Culture with Smear     Status: None (Preliminary result)   Collection Time: 10/26/14  5:49 PM  Result Value Ref Range Status   Specimen Description SYNOVIAL LEFT KNEE  Final   Special Requests A FLUID ON SWAB  Final   Fungal Smear   Final    NO YEAST OR FUNGAL ELEMENTS SEEN Performed at [facility]    Culture   Final    CULTURE IN PROGRESS FOR FOUR WEEKS Performed at [facility]    Report Status PENDING  Incomplete  AFB culture with smear     Status: None (Preliminary result)   Collection Time: 10/26/14  5:49 PM  Result Value Ref Range Status   Specimen Description SYNOVIAL LEFT KNEE  Final   Special Requests A FLUID ON SWAB  Final   Acid Fast Smear   Final    NO ACID FAST BACILLI SEEN Performed at [facility]    Culture   Final    CULTURE WILL BE EXAMINED FOR 6 WEEKS BEFORE ISSUING A FINAL REPORT Performed at [facility]    Report Status PENDING  Incomplete  Anaerobic culture     Status: None (Preliminary result)   Collection Time: 10/26/14  5:51 PM  Result Value Ref Range Status   Specimen Description SYNOVIAL RIGHT KNEE  Final   Special Requests B FLUID ON SWAB  Final   Gram Stain   Final    FEW WBC PRESENT,BOTH PMN AND MONONUCLEAR NO ORGANISMS SEEN    Culture HOLDING FOR POSSIBLE ANAEROBE  Final   Report Status PENDING  Incomplete  Body fluid culture     Status: None   Collection Time: 10/26/14  5:51 PM  Result Value  Ref Range Status   Specimen Description SYNOVIAL RIGHT KNEE  Final   Special Requests B FLUID ON SWAB  Final   Gram Stain   Final    FEW WBC PRESENT,BOTH PMN AND MONONUCLEAR NO ORGANISMS SEEN    Culture NO GROWTH 3 DAYS  Final   Report Status 10/30/2014 FINAL  Final  Fungus Culture with Smear  Status: None (Preliminary result)   Collection Time: 10/26/14  5:51 PM  Result Value Ref Range Status   Specimen Description SYNOVIAL RIGHT KNEE  Final   Special Requests B FLUID ON SWAB  Final   Fungal Smear   Final    NO YEAST OR FUNGAL ELEMENTS SEEN [facility]    Culture   Final    CULTURE IN PROGRESS FOR FOUR WEEKS [facility]    Report Status PENDING  Incomplete  AFB culture with smear     Status: None (Preliminary result)   Collection Time: 10/26/14  5:51 PM  Result Value Ref Range Status   Specimen Description SYNOVIAL RIGHT KNEE  Final   Special Requests B FLUID ON SWAB  Final   Acid Fast Smear   Final    NO ACID FAST BACILLI SEEN [facility]    Culture   Final    CULTURE WILL BE EXAMINED FOR 6 WEEKS BEFORE ISSUING A FINAL REPORT [facility]    Report Status PENDING  Incomplete  AFB culture with smear     Status: None (Preliminary result)   Collection Time: 10/26/14  5:51 PM  Result Value Ref Range Status   Specimen Description TISSUE RIGHT KNEE  Final   Special Requests D PT ON ZOSYN VANCOMYCIN  Final   Acid Fast Smear   Final    NO ACID FAST BACILLI SEEN [facility]    Culture   Final    CULTURE WILL BE EXAMINED FOR 6 WEEKS BEFORE ISSUING A FINAL REPORT Performed at [facility]    Report Status PENDING  Incomplete  Anaerobic culture     Status: None   Collection Time: 10/26/14  5:55 PM  Result Value Ref Range Status   Specimen Description TISSUE LEFT KNEE  Final   Special Requests C  Final   Gram Stain   Final    NO WBC SEEN FEW GRAM  POSITIVE COCCI IN PAIRS FEW GRAM POSITIVE RODS [facility]formed [facility]    Culture   Final    NO ANAEROBES ISOLATED [facility]formed at [facility]    Report Status 10/31/2014 FINAL  Final  Tissue culture     Status: None   Collection Time: 10/26/14  5:55 PM  Result Value Ref Range Status   Specimen Description TISSUE LEFT KNEE  Final   Special Requests C  Final   Gram Stain   Final    NO WBC SEEN FEW GRAM POSITIVE COCCI IN PAIRS FEW GRAM POSITIVE RODS [facility]formed at [facility]    Culture   Final    ABUNDANT STAPHYLOCOCCUS AUREUS Note: RIFAMPIN AND GENTAMICIN SHOULD NOT BE USED AS SINGLE DRUGS FOR TREATMENT OF STAPH INFECTIONS. Performed at [facility]    Report Status 10/30/2014 FINAL  Final   Organism ID, Bacteria STAPHYLOCOCCUS AUREUS  Final      Susceptibility   Staphylococcus aureus - MIC*    CLINDAMYCIN <=0.25 SENSITIVE Sensitive     ERYTHROMYCIN <=0.25 SENSITIVE Sensitive     GENTAMICIN <=0.5 SENSITIVE Sensitive     LEVOFLOXACIN 0.25 SENSITIVE Sensitive     OXACILLIN 0.5 SENSITIVE Sensitive     PENICILLIN >=0.5 RESISTANT Resistant     RIFAMPIN <=0.5 SENSITIVE Sensitive     TRIMETH/SULFA <=10 SENSITIVE Sensitive     VANCOMYCIN 1 SENSITIVE Sensitive     TETRACYCLINE <=1 SENSITIVE Sensitive  MOXIFLOXACIN <=0.25 SENSITIVE Sensitive     * ABUNDANT STAPHYLOCOCCUS AUREUS  Fungus Culture with Smear     Status: None (Preliminary result)   Collection Time: 10/26/14  5:55 PM  Result Value Ref Range Status   Specimen Description TISSUE LEFT KNEE  Final   Special Requests C  Final   Fungal Smear   Final    NO YEAST OR FUNGAL ELEMENTS SEEN Performed at [facility]    Culture   Final    CULTURE IN PROGRESS FOR FOUR WEEKS Performed at [facility]    Report Status PENDING  Incomplete  AFB culture with smear     Status: None (Preliminary result)   Collection Time: 10/26/14  5:55 PM  Result Value Ref Range Status    Specimen Description TISSUE LEFT KNEE  Final   Special Requests C  Final   Acid Fast Smear   Final    NO ACID FAST BACILLI SEEN Performed at [facility]    Culture   Final    CULTURE WILL BE EXAMINED FOR 6 WEEKS BEFORE ISSUING A FINAL REPORT Performed at [facility]    Report Status PENDING  Incomplete  Anaerobic culture     Status: None   Collection Time: 10/26/14  5:57 PM  Result Value Ref Range Status   Specimen Description TISSUE RIGHT KNEE  Final   Special Requests D PT ON ZOSYN AND VANCOMYCIN  Final   Gram Stain   Final    NO WBC SEEN NO ORGANISMS SEEN Performed at [facility]    Culture   Final    NO ANAEROBES ISOLATED Performed at [facility]    Report Status 11/01/2014 FINAL  Final  Fungus Culture with Smear     Status: None (Preliminary result)   Collection Time: 10/26/14  5:57 PM  Result Value Ref Range Status   Specimen Description TISSUE RIGHT KNEE  Final   Special Requests D PT ON ZOSYN VANCOMYCIN  Final   Fungal Smear   Final    NO YEAST OR FUNGAL ELEMENTS SEEN Performed at [facility]    Culture   Final    CULTURE IN PROGRESS FOR FOUR WEEKS Performed at [facility]    Report Status PENDING  Incomplete  Tissue culture     Status: None   Collection Time: 10/26/14  5:57 PM  Result Value Ref Range Status   Specimen Description TISSUE RIGHT KNEE  Final   Special Requests D PT ON ZOSYN VANCOMYCIN  Final   Gram Stain   Final    NO WBC SEEN NO ORGANISMS SEEN Performed at [facility]    Culture   Final    FEW PSEUDOMONAS AERUGINOSA MODERATE STAPHYLOCOCCUS AUREUS Note: RIFAMPIN AND GENTAMICIN SHOULD NOT BE USED AS SINGLE DRUGS FOR TREATMENT OF STAPH INFECTIONS. Performed at [facility]    Report Status 10/30/2014 FINAL  Final   Organism ID, Bacteria PSEUDOMONAS AERUGINOSA  Final   Organism ID, Bacteria STAPHYLOCOCCUS AUREUS  Final      Susceptibility   Staphylococcus aureus - MIC*     CLINDAMYCIN <=0.25 SENSITIVE Sensitive     ERYTHROMYCIN <=0.25 SENSITIVE Sensitive     GENTAMICIN <=0.5 SENSITIVE Sensitive     LEVOFLOXACIN 0.25 SENSITIVE Sensitive     OXACILLIN 0.5 SENSITIVE Sensitive     PENICILLIN >=0.5 RESISTANT Resistant     RIFAMPIN <=0.5 SENSITIVE Sensitive     TRIMETH/SULFA <=10 SENSITIVE Sensitive  VANCOMYCIN <=0.5 SENSITIVE Sensitive     TETRACYCLINE <=1 SENSITIVE Sensitive     MOXIFLOXACIN <=0.25 SENSITIVE Sensitive     * MODERATE STAPHYLOCOCCUS AUREUS   Pseudomonas aeruginosa - MIC*    CEFEPIME <=1 SENSITIVE Sensitive     CEFTAZIDIME 2 SENSITIVE Sensitive     CIPROFLOXACIN <=0.25 SENSITIVE Sensitive     GENTAMICIN <=1 SENSITIVE Sensitive     IMIPENEM 2 SENSITIVE Sensitive     PIP/TAZO <=4 SENSITIVE Sensitive     TOBRAMYCIN <=1 SENSITIVE Sensitive     * FEW PSEUDOMONAS AERUGINOSA  Surgical pcr screen     Status: Abnormal   Collection Time: 10/29/14  6:25 AM  Result Value Ref Range Status   MRSA, PCR NEGATIVE NEGATIVE Final   Staphylococcus aureus POSITIVE (A) NEGATIVE Final    Comment:        The Xpert SA Assay (FDA approved for NASAL specimens in patients over 54 years of age), is one component of a comprehensive surveillance program.  Test performance has been validated by [facility] for patients greater than or equal to 29 year old. It is not intended to diagnose infection nor to guide or monitor treatment.   Culture, blood (routine x 2)     Status: None (Preliminary result)   Collection Time: 10/29/14  6:35 PM  Result Value Ref Range Status   Specimen Description BLOOD LEFT ANTECUBITAL  Final   Special Requests BOTTLES DRAWN AEROBIC AND ANAEROBIC 10CC  Final   Culture NO GROWTH 3 DAYS  Final   Report Status PENDING  Incomplete  Culture, blood (routine x 2)     Status: None (Preliminary result)   Collection Time: 10/29/14  6:45 PM  Result Value Ref Range Status   Specimen Description BLOOD LEFT HAND  Final   Special Requests  BOTTLES DRAWN AEROBIC AND ANAEROBIC 10CC  Final   Culture NO GROWTH 3 DAYS  Final   Report Status PENDING  Incomplete    Studies/Results: No results found.  Assessment/Plan:  1) MSSA bacteremia - on zosyn due to polymicrobial joint infection.  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  Continue with zosyn as above.    Staci Righter, MD [facility] [email or web address] [phone] pager   [phone] cell 11/02/2014, 2:56 PM

## 2014-11-02 NOTE — Progress Notes (Signed)
Called to patient's room.  Pt verbally expressed being very upset about procedure (TEE) scheduled for tomorrow, stated "The doctor came in here and told me I was going to get something put down my throat and that I might die.  I need to talk to my cousin.  I'm scared."  Attempted to reassure pt.  Tried to contact JB in patient's room, but no answer.  Made a second attempt at the time this note was written with no response.  Informed oncoming night RN.

## 2014-11-03 ENCOUNTER — Encounter (HOSPITAL_COMMUNITY)
Admission: EM | Disposition: A | Discharge status: Skilled Nursing Facility | Payer: Self-pay | Source: Home / Self Care | Attending: Family Medicine

## 2014-11-03 ENCOUNTER — Inpatient Hospital Stay (HOSPITAL_COMMUNITY): Payer: Medicare Other

## 2014-11-03 DIAGNOSIS — R7881 Bacteremia: Secondary | ICD-10-CM

## 2014-11-03 HISTORY — PX: TEE WITHOUT CARDIOVERSION: SHX5443

## 2014-11-03 LAB — CBC
HEMATOCRIT: 24.3 % — AB (ref 39.0–52.0)
HEMOGLOBIN: 7.8 g/dL — AB (ref 13.0–17.0)
MCH: 28.8 pg (ref 26.0–34.0)
MCHC: 32.1 g/dL (ref 30.0–36.0)
MCV: 89.7 fL (ref 78.0–100.0)
Platelets: 485 10*3/uL — ABNORMAL HIGH (ref 150–400)
RBC: 2.71 MIL/uL — AB (ref 4.22–5.81)
RDW: 14.8 % (ref 11.5–15.5)
WBC: 7.6 10*3/uL (ref 4.0–10.5)

## 2014-11-03 LAB — CULTURE, BLOOD (ROUTINE X 2)
Culture: NO GROWTH
Culture: NO GROWTH

## 2014-11-03 LAB — BODY FLUID CULTURE

## 2014-11-03 LAB — MISCELLANEOUS TEST

## 2014-11-03 SURGERY — ECHOCARDIOGRAM, TRANSESOPHAGEAL
Anesthesia: Moderate Sedation

## 2014-11-03 MED ORDER — MIDAZOLAM HCL 10 MG/2ML IJ SOLN
INTRAMUSCULAR | Status: DC | PRN
  Administered 2014-11-03: 3 mg via INTRAVENOUS
  Administered 2014-11-03: 1 mg via INTRAVENOUS

## 2014-11-03 MED ORDER — MIDAZOLAM HCL 5 MG/ML IJ SOLN
INTRAMUSCULAR | Status: AC
  Filled 2014-11-03: qty 2

## 2014-11-03 MED ORDER — FENTANYL CITRATE (PF) 100 MCG/2ML IJ SOLN
INTRAMUSCULAR | Status: AC
  Filled 2014-11-03: qty 2

## 2014-11-03 MED ORDER — BUTAMBEN-TETRACAINE-BENZOCAINE 2-2-14 % EX AERO
INHALATION_SPRAY | CUTANEOUS | Status: DC | PRN
Start: 2014-11-03 — End: 2014-11-03
  Administered 2014-11-03: 2 via TOPICAL

## 2014-11-03 MED ORDER — PRO-STAT SUGAR FREE PO LIQD
30.0000 mL | Freq: Two times a day (BID) | ORAL | Status: DC
  Administered 2014-11-04 – 2014-11-05 (×3): 30 mL via ORAL
  Filled 2014-11-03 (×7): qty 30

## 2014-11-03 MED ORDER — SODIUM CHLORIDE 0.9 % IV SOLN: INTRAVENOUS | Status: DC

## 2014-11-03 MED ORDER — FENTANYL CITRATE (PF) 100 MCG/2ML IJ SOLN
INTRAMUSCULAR | Status: DC | PRN
  Administered 2014-11-03: 25 ug via INTRAVENOUS

## 2014-11-03 NOTE — Progress Notes (Signed)
*  PRELIMINARY RESULTS* Echocardiogram Echocardiogram Transesophageal has been performed.  Jeryl Columbialliott, Panayiota Larkin 11/03/2014, 12:59 PM

## 2014-11-03 NOTE — Progress Notes (Signed)
Family Medicine Teaching Service Daily Progress Note Intern Pager: [phone]  Patient name: Timothy DubinWarren D Norman Medical record number: [id number] Date of birth: 05/31/1950 Age: 64 y.o. Gender: male  Primary Care Provider: No primary care provider on file. Consultants: Ortho, ID Code Status: FULL  Pt Overview and Major Events to Date:  07/12: Admitted to floor, Ortho debrided legs in OR 07/13: Wound VAC in place BL LE, ID consult 07/20: TEE today Assessment and Plan: Timothy Norman is a 64 y.o. male presenting with severe bilateral necrotic leg wounds. PMH is significant for alcohol abuse.   Bilateral Necrotic Leg Wounds: Patient reports that wound started after a fall the week prior to admission, however appeared to be infected for months and maggots were removed from the wounds upon presentation to the ED. Patient is now s/p significant debridement and bilateral knee washout. Blood cultures have grown MSSA and wound cultures have grown pseudomonas.   -Vanc (7/12-7/14), Zosyn (7/12- ) -PT/OT consulted, recommended SNF placement -Pain control with tylenol and IV morphine 4mg  q4hrs prn -Orthopedics debrided and I/D, signed off on 7/18 -Per ortho note, plastics to see patient Monday (7/25?) for possible skin grafts and further wound management -Will need to see who is managing wound Vac until plastics is on board -Wheelchair with supervision ordered so patient can leave room   Bacteremia; 2/2 Blood cultures positive for MSSA. HIV, RPR, hepatitis panel negative.  -ID consulted, appreciate assistance -Abx as above -TTE with preserved EF, no obvious endocarditis.  -f/u on TEE (will be done on 7/20) -Repeat blood cultures (7/15) pending   AMS/Cognitive Impairment. Patient noted to have abnormal behavior in the ED. Ethanol negative. UDS only positive for benzos and opiates which patient received in ED. LFTs unremarkable. Psychiatric history unknown, possible schizophrenia.  Friend JB reports  that this is the patient's baseline mental status. 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  -Monitor mental status -Call family member to get more information if needed, name is Dorma RussellJB (cousin): [phone]  Alcohol Abuse; Chronic -social work consulted -CIWA protocol discontinued - Thiamine, folate, multivitamin.   Malnutrition. Albumin 2.9 on admission. - Nutrition consulted, appreciate assistance.   FEN/GI: SLIV, regular PPx: Heparin  Disposition:  Anticipate discharge to SNF when medically stable.   Subjective:  Patient is doing well this morning, resting comfortably in bed. Yesterday patient expressed concern to the nurse about TEE procedure. I discussed the procedure with the patient and explained we need to get a better look at his heart. He agreed he will have the procedure. He is still seeing cars flying outside his window in the sky and he "waves to the people". He admits to leg pain and says its worse when he moves. He still wants to get around with a wheelchair. Patient seems sad today, he admitted to feeling sad when I asked why he said "I can't tell you". Denied any suicidal or homicidal ideation.  Objective: Temp:  [98.2 F (36.8 C)-98.5 F (36.9 C)] 98.5 F (36.9 C) (07/20 0558) Pulse Rate:  [86-90] 86 (07/20 0558) Resp:  [1-20] 17 (07/20 0558) BP: (118-129)/(72-77) 129/77 mmHg (07/20 0558) SpO2:  [99 %-100 %] 100 % (07/20 0558) Physical Exam: General: NAD, sitting in bedside chair.  Cardiovascular: RRR, no murmurs Respiratory: clear to auscultation bilaterally, no increased WOB Abdomen: (+) BS, nontender, nondistended Extremities: wound vac in place in bilateral lower extremities, draining serosanginous fluid.  Neuro: alert. No focal neurologic deficits.  Psych: poor insight. (+) visual hallucinations  Laboratory/Micro/Diagnostic Studies:  Recent Results (from the past 240 hour(s))  Blood culture (routine x 2)     Status: None    Collection Time: 10/26/14 12:30 PM  Result Value Ref Range Status   Specimen Description BLOOD RIGHT ARM  Final   Special Requests BOTTLES DRAWN AEROBIC AND ANAEROBIC 5CC  Final   Culture  Setup Time   Final    GRAM POSITIVE COCCI IN CLUSTERS IN BOTH AEROBIC AND ANAEROBIC BOTTLES CRITICAL RESULT CALLED TO, READ BACK BY AND VERIFIED WITH: A.Ricki Miller [id number] 10/27/14 M.CAMPBELL GRAM STAIN REVIEWED-AGREE WITH RESULT S. YARBROUGH    Culture   Final    STAPHYLOCOCCUS AUREUS STAPHYLOCOCCUS SPECIES (COAGULASE NEGATIVE)    Report Status 10/31/2014 FINAL  Final   Organism ID, Bacteria STAPHYLOCOCCUS AUREUS  Final   Organism ID, Bacteria STAPHYLOCOCCUS SPECIES (COAGULASE NEGATIVE)  Final      Susceptibility   Staphylococcus aureus - MIC*    CIPROFLOXACIN <=0.5 SENSITIVE Sensitive     ERYTHROMYCIN <=0.25 SENSITIVE Sensitive     GENTAMICIN <=0.5 SENSITIVE Sensitive     OXACILLIN 0.5 SENSITIVE Sensitive     TETRACYCLINE <=1 SENSITIVE Sensitive     VANCOMYCIN <=0.5 SENSITIVE Sensitive     TRIMETH/SULFA <=10 SENSITIVE Sensitive     CLINDAMYCIN <=0.25 SENSITIVE Sensitive     RIFAMPIN <=0.5 SENSITIVE Sensitive     Inducible Clindamycin NEGATIVE Sensitive     * STAPHYLOCOCCUS AUREUS   Staphylococcus species (coagulase negative) - MIC*    CIPROFLOXACIN <=0.5 SENSITIVE Sensitive     ERYTHROMYCIN <=0.25 SENSITIVE Sensitive     GENTAMICIN <=0.5 SENSITIVE Sensitive     OXACILLIN <=0.25 SENSITIVE Sensitive     TETRACYCLINE <=1 SENSITIVE Sensitive     VANCOMYCIN <=0.5 SENSITIVE Sensitive     TRIMETH/SULFA <=10 SENSITIVE Sensitive     CLINDAMYCIN <=0.25 SENSITIVE Sensitive     RIFAMPIN <=0.5 SENSITIVE Sensitive     Inducible Clindamycin NEGATIVE Sensitive     * STAPHYLOCOCCUS SPECIES (COAGULASE NEGATIVE)  Blood culture (routine x 2)     Status: None   Collection Time: 10/26/14 12:30 PM  Result Value Ref Range Status   Specimen Description BLOOD LEFT ARM  Final   Special Requests BOTTLES DRAWN  AEROBIC AND ANAEROBIC 5CC  Final   Culture  Setup Time   Final    GRAM POSITIVE COCCI IN CLUSTERS AEROBIC BOTTLE ONLY CRITICAL RESULT CALLED TO, READ BACK BY AND VERIFIED WITH: A.Ricki Miller [id number] 10/27/14 M.CAMPBELL GRAM STAIN REVIEWED-AGREE WITH RESULT S. YARBROUGH    Culture   Final    STAPHYLOCOCCUS SPECIES (COAGULASE NEGATIVE) SUSCEPTIBILITIES PERFORMED ON PREVIOUS CULTURE WITHIN THE LAST 5 DAYS.    Report Status 10/31/2014 FINAL  Final  Body fluid culture     Status: None (Preliminary result)   Collection Time: 10/26/14  5:49 PM  Result Value Ref Range Status   Specimen Description SYNOVIAL LEFT KNEE  Final   Special Requests FLUID ON A SWAB  Final   Gram Stain   Final    FEW WBC PRESENT,BOTH PMN AND MONONUCLEAR NO ORGANISMS SEEN    Culture   Final    STAPHYLOCOCCUS AUREUS SUSCEPTIBILITIES PERFORMED ON PREVIOUS CULTURE WITHIN THE LAST 5 DAYS. STREPTOCOCCUS GROUP D;high probability for S.bovis SUSCEPTIBILITIES TO FOLLOW    Report Status PENDING  Incomplete  Anaerobic culture     Status: None   Collection Time: 10/26/14  5:49 PM  Result Value Ref Range Status   Specimen Description SYNOVIAL LEFT KNEE  Final   Special Requests  A FLUID ON SWAB  Final   Gram Stain   Final    FEW WBC PRESENT,BOTH PMN AND MONONUCLEAR NO ORGANISMS SEEN    Culture NO ANAEROBES ISOLATED  Final   Report Status 11/02/2014 FINAL  Final  Fungus Culture with Smear     Status: None (Preliminary result)   Collection Time: 10/26/14  5:49 PM  Result Value Ref Range Status   Specimen Description SYNOVIAL LEFT KNEE  Final   Special Requests A FLUID ON SWAB  Final   Fungal Smear   Final    NO YEAST OR FUNGAL ELEMENTS SEEN Performed at [facility]    Culture   Final    CULTURE IN PROGRESS FOR FOUR WEEKS Performed at [facility]    Report Status PENDING  Incomplete  AFB culture with smear     Status: None (Preliminary result)   Collection Time: 10/26/14  5:49 PM  Result Value Ref  Range Status   Specimen Description SYNOVIAL LEFT KNEE  Final   Special Requests A FLUID ON SWAB  Final   Acid Fast Smear   Final    NO ACID FAST BACILLI SEEN Performed at [facility]    Culture   Final    CULTURE WILL BE EXAMINED FOR 6 WEEKS BEFORE ISSUING A FINAL REPORT Performed at [facility]    Report Status PENDING  Incomplete  Anaerobic culture     Status: None (Preliminary result)   Collection Time: 10/26/14  5:51 PM  Result Value Ref Range Status   Specimen Description SYNOVIAL RIGHT KNEE  Final   Special Requests B FLUID ON SWAB  Final   Gram Stain   Final    FEW WBC PRESENT,BOTH PMN AND MONONUCLEAR NO ORGANISMS SEEN    Culture HOLDING FOR POSSIBLE ANAEROBE  Final   Report Status PENDING  Incomplete  Body fluid culture     Status: None   Collection Time: 10/26/14  5:51 PM  Result Value Ref Range Status   Specimen Description SYNOVIAL RIGHT KNEE  Final   Special Requests B FLUID ON SWAB  Final   Gram Stain   Final    FEW WBC PRESENT,BOTH PMN AND MONONUCLEAR NO ORGANISMS SEEN    Culture NO GROWTH 3 DAYS  Final   Report Status 10/30/2014 FINAL  Final  Fungus Culture with Smear     Status: None (Preliminary result)   Collection Time: 10/26/14  5:51 PM  Result Value Ref Range Status   Specimen Description SYNOVIAL RIGHT KNEE  Final   Special Requests B FLUID ON SWAB  Final   Fungal Smear   Final    NO YEAST OR FUNGAL ELEMENTS SEEN Performed at [facility]    Culture   Final    CULTURE IN PROGRESS FOR FOUR WEEKS Performed at [facility]    Report Status PENDING  Incomplete  AFB culture with smear     Status: None (Preliminary result)   Collection Time: 10/26/14  5:51 PM  Result Value Ref Range Status   Specimen Description SYNOVIAL RIGHT KNEE  Final   Special Requests B FLUID ON SWAB  Final   Acid Fast Smear   Final    NO ACID FAST BACILLI SEEN Performed at [facility]    Culture   Final    CULTURE WILL BE  EXAMINED FOR 6 WEEKS BEFORE ISSUING A FINAL REPORT Performed at [facility]    Report Status PENDING  Incomplete  AFB culture with smear     Status: None (Preliminary result)   Collection Time: 10/26/14  5:51 PM  Result Value Ref Range Status   Specimen Description TISSUE RIGHT KNEE  Final   Special Requests D PT ON ZOSYN VANCOMYCIN  Final   Acid Fast Smear   Final    NO ACID FAST BACILLI SEEN Performed at [facility]    Culture   Final    CULTURE WILL BE EXAMINED FOR 6 WEEKS BEFORE ISSUING A FINAL REPORT Performed at [facility]    Report Status PENDING  Incomplete  Anaerobic culture     Status: None   Collection Time: 10/26/14  5:55 PM  Result Value Ref Range Status   Specimen Description TISSUE LEFT KNEE  Final   Special Requests C  Final   Gram Stain   Final    NO WBC SEEN FEW GRAM POSITIVE COCCI IN PAIRS FEW GRAM POSITIVE RODS Performed at [facility]    Culture   Final    NO ANAEROBES ISOLATED Performed at [facility]    Report Status 10/31/2014 FINAL  Final  Tissue culture     Status: None   Collection Time: 10/26/14  5:55 PM  Result Value Ref Range Status   Specimen Description TISSUE LEFT KNEE  Final   Special Requests C  Final   Gram Stain   Final    NO WBC SEEN FEW GRAM POSITIVE COCCI IN PAIRS FEW GRAM POSITIVE RODS Performed at [facility]    Culture   Final    ABUNDANT STAPHYLOCOCCUS AUREUS Note: RIFAMPIN AND GENTAMICIN SHOULD NOT BE USED AS SINGLE DRUGS FOR TREATMENT OF STAPH INFECTIONS. Performed at [facility]    Report Status 10/30/2014 FINAL  Final   Organism ID, Bacteria STAPHYLOCOCCUS AUREUS  Final      Susceptibility   Staphylococcus aureus - MIC*    CLINDAMYCIN <=0.25 SENSITIVE Sensitive     ERYTHROMYCIN <=0.25 SENSITIVE Sensitive     GENTAMICIN <=0.5 SENSITIVE Sensitive     LEVOFLOXACIN 0.25 SENSITIVE Sensitive     OXACILLIN 0.5 SENSITIVE Sensitive     PENICILLIN >=0.5  RESISTANT Resistant     RIFAMPIN <=0.5 SENSITIVE Sensitive     TRIMETH/SULFA <=10 SENSITIVE Sensitive     VANCOMYCIN 1 SENSITIVE Sensitive     TETRACYCLINE <=1 SENSITIVE Sensitive     MOXIFLOXACIN <=0.25 SENSITIVE Sensitive     * ABUNDANT STAPHYLOCOCCUS AUREUS  Fungus Culture with Smear     Status: None (Preliminary result)   Collection Time: 10/26/14  5:55 PM  Result Value Ref Range Status   Specimen Description TISSUE LEFT KNEE  Final   Special Requests C  Final   Fungal Smear   Final    NO YEAST OR FUNGAL ELEMENTS SEEN Performed at [facility]    Culture   Final    CULTURE IN PROGRESS FOR FOUR WEEKS Performed at [facility]    Report Status PENDING  Incomplete  AFB culture with smear     Status: None (Preliminary result)   Collection Time: 10/26/14  5:55 PM  Result Value Ref Range Status   Specimen Description TISSUE LEFT KNEE  Final   Special Requests C  Final   Acid Fast Smear   Final    NO ACID FAST BACILLI SEEN Performed at [facility]    Culture   Final    CULTURE WILL BE EXAMINED FOR 6 WEEKS BEFORE ISSUING A  FINAL REPORT Performed at [facility]    Report Status PENDING  Incomplete  Anaerobic culture     Status: None   Collection Time: 10/26/14  5:57 PM  Result Value Ref Range Status   Specimen Description TISSUE RIGHT KNEE  Final   Special Requests D PT ON ZOSYN AND VANCOMYCIN  Final   Gram Stain   Final    NO WBC SEEN NO ORGANISMS SEEN Performed at [facility]    Culture   Final    NO ANAEROBES ISOLATED Performed at [facility]    Report Status 11/01/2014 FINAL  Final  Fungus Culture with Smear     Status: None (Preliminary result)   Collection Time: 10/26/14  5:57 PM  Result Value Ref Range Status   Specimen Description TISSUE RIGHT KNEE  Final   Special Requests D PT ON ZOSYN VANCOMYCIN  Final   Fungal Smear   Final    NO YEAST OR FUNGAL ELEMENTS SEEN Performed at [facility]     Culture   Final    CULTURE IN PROGRESS FOR FOUR WEEKS Performed at [facility]    Report Status PENDING  Incomplete  Tissue culture     Status: None   Collection Time: 10/26/14  5:57 PM  Result Value Ref Range Status   Specimen Description TISSUE RIGHT KNEE  Final   Special Requests D PT ON ZOSYN VANCOMYCIN  Final   Gram Stain   Final    NO WBC SEEN NO ORGANISMS SEEN Performed at [facility]    Culture   Final    FEW PSEUDOMONAS AERUGINOSA MODERATE STAPHYLOCOCCUS AUREUS Note: RIFAMPIN AND GENTAMICIN SHOULD NOT BE USED AS SINGLE DRUGS FOR TREATMENT OF STAPH INFECTIONS. Performed at [facility]    Report Status 10/30/2014 FINAL  Final   Organism ID, Bacteria PSEUDOMONAS AERUGINOSA  Final   Organism ID, Bacteria STAPHYLOCOCCUS AUREUS  Final      Susceptibility   Staphylococcus aureus - MIC*    CLINDAMYCIN <=0.25 SENSITIVE Sensitive     ERYTHROMYCIN <=0.25 SENSITIVE Sensitive     GENTAMICIN <=0.5 SENSITIVE Sensitive     LEVOFLOXACIN 0.25 SENSITIVE Sensitive     OXACILLIN 0.5 SENSITIVE Sensitive     PENICILLIN >=0.5 RESISTANT Resistant     RIFAMPIN <=0.5 SENSITIVE Sensitive     TRIMETH/SULFA <=10 SENSITIVE Sensitive     VANCOMYCIN <=0.5 SENSITIVE Sensitive     TETRACYCLINE <=1 SENSITIVE Sensitive     MOXIFLOXACIN <=0.25 SENSITIVE Sensitive     * MODERATE STAPHYLOCOCCUS AUREUS   Pseudomonas aeruginosa - MIC*    CEFEPIME <=1 SENSITIVE Sensitive     CEFTAZIDIME 2 SENSITIVE Sensitive     CIPROFLOXACIN <=0.25 SENSITIVE Sensitive     GENTAMICIN <=1 SENSITIVE Sensitive     IMIPENEM 2 SENSITIVE Sensitive     PIP/TAZO <=4 SENSITIVE Sensitive     TOBRAMYCIN <=1 SENSITIVE Sensitive     * FEW PSEUDOMONAS AERUGINOSA  Surgical pcr screen     Status: Abnormal   Collection Time: 10/29/14  6:25 AM  Result Value Ref Range Status   MRSA, PCR NEGATIVE NEGATIVE Final   Staphylococcus aureus POSITIVE (A) NEGATIVE Final    Comment:        The Xpert SA Assay  (FDA approved for NASAL specimens in patients over 65 years of age), is one component of a comprehensive surveillance program.  Test performance has been validated by [facility] for patients greater than or equal to  77 year old. It is not intended to diagnose infection nor to guide or monitor treatment.   Culture, blood (routine x 2)     Status: None (Preliminary result)   Collection Time: 10/29/14  6:35 PM  Result Value Ref Range Status   Specimen Description BLOOD LEFT ANTECUBITAL  Final   Special Requests BOTTLES DRAWN AEROBIC AND ANAEROBIC 10CC  Final   Culture NO GROWTH 4 DAYS  Final   Report Status PENDING  Incomplete  Culture, blood (routine x 2)     Status: None (Preliminary result)   Collection Time: 10/29/14  6:45 PM  Result Value Ref Range Status   Specimen Description BLOOD LEFT HAND  Final   Special Requests BOTTLES DRAWN AEROBIC AND ANAEROBIC 10CC  Final   Culture NO GROWTH 4 DAYS  Final   Report Status PENDING  Incomplete     Beaulah Dinning, MD 11/03/2014, 7:24 AM PGY-1, [facility] FPTS Intern pager: [phone], text pages welcome

## 2014-11-03 NOTE — Clinical Social Work Note (Addendum)
CSW is continuing to follow patient for DC needs. [facility] CSW attempted to reach cousin JB today but was not successful.   UPDATE: CSW received return phone call from JB. CSW followed up with JB regarding Medicaid Application which he said he would apply for on 7/15. JB has not completed application. CSW has notified financial counselor Dorene SorrowJerry of need for long term Medicaid application. Dorene SorrowJerry will assist.  Roddie McBryant Justine Dines MSW, [place], [place], [phone]

## 2014-11-03 NOTE — Progress Notes (Signed)
Nutrition Follow-up  DOCUMENTATION CODES:   Not applicable  INTERVENTION:    Continue Ensure Enlive po TID, each supplement provides 350 kcal and 20 grams of protein  Add Prostat liquid protein po 30 ml BID with meals, each supplement provides 100 kcal, 15 grams protein  NUTRITION DIAGNOSIS:   Increased nutrient needs related to wound healing as evidenced by estimated needs, ongoing  GOAL:   Patient will meet greater than or equal to 90% of their needs, unmet  MONITOR:   PO intake, Supplement acceptance, Labs, Weight trends, Skin, I & O's  ASSESSMENT:   64 year old male with past medical history of alcohol abuse and homelessness presents with bilateral leg wounds. Patient currently unable to provide significant past medical history secondary to somewhat altered mental status. He does state that he fell this past week however is unable to provide additional history. Patient was taken to the OR last evening for debridement. Wound vacs are currently in place in the bilateral lower extremities. Please see resident note for additional history present illness.  Pt in ECHO LAB at time of visit.  Patient s/p procedure 7/15: IRRIGATION AND DEBRIDEMENT BILATERAL KNEES WITH VAC CHANGE  Pt previously on a Regular diet prior to NPO status.  PO intake variable to poor at 0-50% per flowsheet records.  Pt receiving Ensure Enlive TID between meals.  Would benefit from addition or liquid protein supplement.  RD to order.  RD unable to complete Nutrition Focused Physical Exam at this time.  Diet Order:  Diet NPO time specified  Skin:  Wound (see comment) (necrotic wounds with lt and rt legs; bilateral wound vac)  Last BM:  10/26/14  Height:   Ht Readings from Last 1 Encounters:  10/26/14 5\' 6"  (1.676 m)    Weight:   Wt Readings from Last 1 Encounters:  10/26/14 151 lb 0.2 oz (68.5 kg)    Ideal Body Weight:  64.5 kg  Wt Readings from Last 10 Encounters:  10/26/14 151 lb 0.2 oz  (68.5 kg)    BMI:  Body mass index is 24.39 kg/(m^2).  Estimated Nutritional Needs:   Kcal:  2000-2200  Protein:  105-120 grams  Fluid:  2.0-2.2 L  EDUCATION NEEDS:   No education needs identified at this time  Maureen ChattersKatie Mohamad Bruso, RD, LDN Pager #: [phone] After-Hours Pager #: [phone]

## 2014-11-03 NOTE — CV Procedure (Signed)
TEE:  3 mg versed 25 ug fentanyl  Normal MV,AV, TV and PV Normal EF 60% with severe LVH septum 18 mm No ASD/PFO No LAA thrombus No SBE No Effusion No signficant aortic debris  Timothy HawsPeter Norman

## 2014-11-03 NOTE — Care Management Important Message (Signed)
Important Message  Patient Details  Name: Riccardo DubinWarren D Klonowski MRN: [id number] Date of Birth: 05/01/1950   Medicare Important Message Given:  Yes-fourth notification given    Kyla BalzarineShealy, Araeya Lamb Abena 11/03/2014, 11:26 AMImportant Message  Patient Details  Name: Riccardo DubinWarren D Perret MRN: [id number] Date of Birth: 05/18/1950   Medicare Important Message Given:  Yes-fourth notification given    Kyla BalzarineShealy, Vani Gunner Abena 11/03/2014, 11:26 AM

## 2014-11-03 NOTE — Progress Notes (Signed)
NCM spoke with ortho MD, states Plastic surgery was going to see patient on 7/18.  Dr. Natale MilchLancaster will try to contact plastics regarding consult.

## 2014-11-03 NOTE — Progress Notes (Signed)
[facility]  Date of Admission:  10/26/2014  Antibiotics: Pip/tazo  Subjective: Does not verbalize anything to me  Objective: Temp:  [97.6 F (36.4 C)-98.5 F (36.9 C)] 97.6 F (36.4 C) (07/20 1337) Pulse Rate:  [75-88] 75 (07/20 1337) Resp:  [1-26] 18 (07/20 1337) BP: (112-152)/(66-93) 117/76 mmHg (07/20 1337) SpO2:  [94 %-100 %] 97 % (07/20 1337)  General: awake, alert, nad Skin: no rashes Lungs: CTA B Cor: RRR Ext: bilateral wound vac around knees  Lab Results Lab Results  Component Value Date   WBC 14.3* 10/27/2014   HGB 8.6* 10/27/2014   HCT 25.3* 10/27/2014   MCV 87.2 10/27/2014   PLT 406* 10/27/2014    Lab Results  Component Value Date   CREATININE 1.18 10/31/2014   BUN 12 10/31/2014   NA 139 10/31/2014   K 3.7 10/31/2014   CL 106 10/31/2014   CO2 27 10/31/2014    Lab Results  Component Value Date   ALT 25 10/26/2014   AST 49* 10/26/2014   ALKPHOS 55 10/26/2014   BILITOT 2.6* 10/26/2014      Microbiology: Recent Results (from the past 240 hour(s))  Blood culture (routine x 2)     Status: None   Collection Time: 10/26/14 12:30 PM  Result Value Ref Range Status   Specimen Description BLOOD RIGHT ARM  Final   Special Requests BOTTLES DRAWN AEROBIC AND ANAEROBIC 5CC  Final   Culture  Setup Time   Final    GRAM POSITIVE COCCI IN CLUSTERS IN BOTH AEROBIC AND ANAEROBIC BOTTLES CRITICAL RESULT CALLED TO, READ BACK BY AND VERIFIED WITH: A.Ricki Miller [id number] 10/27/14 M.CAMPBELL GRAM STAIN REVIEWED-AGREE WITH RESULT S. YARBROUGH    Culture   Final    STAPHYLOCOCCUS AUREUS STAPHYLOCOCCUS SPECIES (COAGULASE NEGATIVE)    Report Status 10/31/2014 FINAL  Final   Organism ID, Bacteria STAPHYLOCOCCUS AUREUS  Final   Organism ID, Bacteria STAPHYLOCOCCUS SPECIES (COAGULASE NEGATIVE)  Final      Susceptibility   Staphylococcus aureus - MIC*    CIPROFLOXACIN <=0.5 SENSITIVE Sensitive     ERYTHROMYCIN <=0.25 SENSITIVE Sensitive       GENTAMICIN <=0.5 SENSITIVE Sensitive     OXACILLIN 0.5 SENSITIVE Sensitive     TETRACYCLINE <=1 SENSITIVE Sensitive     VANCOMYCIN <=0.5 SENSITIVE Sensitive     TRIMETH/SULFA <=10 SENSITIVE Sensitive     CLINDAMYCIN <=0.25 SENSITIVE Sensitive     RIFAMPIN <=0.5 SENSITIVE Sensitive     Inducible Clindamycin NEGATIVE Sensitive     * STAPHYLOCOCCUS AUREUS   Staphylococcus species (coagulase negative) - MIC*    CIPROFLOXACIN <=0.5 SENSITIVE Sensitive     ERYTHROMYCIN <=0.25 SENSITIVE Sensitive     GENTAMICIN <=0.5 SENSITIVE Sensitive     OXACILLIN <=0.25 SENSITIVE Sensitive     TETRACYCLINE <=1 SENSITIVE Sensitive     VANCOMYCIN <=0.5 SENSITIVE Sensitive     TRIMETH/SULFA <=10 SENSITIVE Sensitive     CLINDAMYCIN <=0.25 SENSITIVE Sensitive     RIFAMPIN <=0.5 SENSITIVE Sensitive     Inducible Clindamycin NEGATIVE Sensitive     * STAPHYLOCOCCUS SPECIES (COAGULASE NEGATIVE)  Blood culture (routine x 2)     Status: None   Collection Time: 10/26/14 12:30 PM  Result Value Ref Range Status   Specimen Description BLOOD LEFT ARM  Final   Special Requests BOTTLES DRAWN AEROBIC AND ANAEROBIC 5CC  Final   Culture  Setup Time   Final    GRAM POSITIVE COCCI IN CLUSTERS AEROBIC BOTTLE ONLY  CRITICAL RESULT CALLED TO, READ BACK BY AND VERIFIED WITH: A.Ricki Miller [id number] 10/27/14 M.CAMPBELL GRAM STAIN REVIEWED-AGREE WITH RESULT S. YARBROUGH    Culture   Final    STAPHYLOCOCCUS SPECIES (COAGULASE NEGATIVE) SUSCEPTIBILITIES PERFORMED ON PREVIOUS CULTURE WITHIN THE LAST 5 DAYS.    Report Status 10/31/2014 FINAL  Final  Body fluid culture     Status: None   Collection Time: 10/26/14  5:49 PM  Result Value Ref Range Status   Specimen Description SYNOVIAL LEFT KNEE  Final   Special Requests FLUID ON A SWAB  Final   Gram Stain   Final    FEW WBC PRESENT,BOTH PMN AND MONONUCLEAR NO ORGANISMS SEEN    Culture   Final    STAPHYLOCOCCUS AUREUS SUSCEPTIBILITIES PERFORMED ON PREVIOUS CULTURE WITHIN THE  LAST 5 DAYS. STREPTOCOCCUS GROUP D;high probability for S.bovis SENSITIVITIES PERFORMED BY [facility]    Report Status 11/03/2014 FINAL  Final   Organism ID, Bacteria STREPTOCOCCUS GROUP D;high probability for S.bovis  Final      Susceptibility   Streptococcus group d;high probability for s.bovis - MIC (ETEST)*    PENICILLIN Value in next row       0.032SENSITIVE    * STREPTOCOCCUS GROUP D;high probability for S.bovis  Anaerobic culture     Status: None   Collection Time: 10/26/14  5:49 PM  Result Value Ref Range Status   Specimen Description SYNOVIAL LEFT KNEE  Final   Special Requests A FLUID ON SWAB  Final   Gram Stain   Final    FEW WBC PRESENT,BOTH PMN AND MONONUCLEAR NO ORGANISMS SEEN    Culture NO ANAEROBES ISOLATED  Final   Report Status 11/02/2014 FINAL  Final  Fungus Culture with Smear     Status: None (Preliminary result)   Collection Time: 10/26/14  5:49 PM  Result Value Ref Range Status   Specimen Description SYNOVIAL LEFT KNEE  Final   Special Requests A FLUID ON SWAB  Final   Fungal Smear   Final    NO YEAST OR FUNGAL ELEMENTS SEEN Performed at [facility]    Culture   Final    CULTURE IN PROGRESS FOR FOUR WEEKS Performed at [facility]    Report Status PENDING  Incomplete  AFB culture with smear     Status: None (Preliminary result)   Collection Time: 10/26/14  5:49 PM  Result Value Ref Range Status   Specimen Description SYNOVIAL LEFT KNEE  Final   Special Requests A FLUID ON SWAB  Final   Acid Fast Smear   Final    NO ACID FAST BACILLI SEEN Performed at [facility]    Culture   Final    CULTURE WILL BE EXAMINED FOR 6 WEEKS BEFORE ISSUING A FINAL REPORT Performed at [facility]    Report Status PENDING  Incomplete  Anaerobic culture     Status: None (Preliminary result)   Collection Time: 10/26/14  5:51 PM  Result Value Ref Range Status   Specimen Description SYNOVIAL RIGHT KNEE  Final   Special Requests  B FLUID ON SWAB  Final   Gram Stain   Final    FEW WBC PRESENT,BOTH PMN AND MONONUCLEAR NO ORGANISMS SEEN    Culture [facility]  Final   Report Status PENDING  Incomplete  Body fluid culture     Status: None   Collection Time: 10/26/14  5:51 PM  Result Value Ref Range Status   Specimen Description SYNOVIAL RIGHT KNEE  Final   Special Requests B FLUID ON SWAB  Final   Gram Stain   Final    FEW WBC PRESENT,BOTH PMN AND MONONUCLEAR NO ORGANISMS SEEN    Culture NO GROWTH 3 DAYS  Final   Report Status 10/30/2014 FINAL  Final  Fungus Culture with Smear     Status: None (Preliminary result)   Collection Time: 10/26/14  5:51 PM  Result Value Ref Range Status   Specimen Description SYNOVIAL RIGHT KNEE  Final   Special Requests B FLUID ON SWAB  Final   Fungal Smear   Final    NO YEAST OR FUNGAL ELEMENTS SEEN [facility]    Culture   Final    CULTURE IN PROGRESS FOR FOUR WEEKS [facility]    Report Status PENDING  Incomplete  AFB culture with smear     Status: None (Preliminary result)   Collection Time: 10/26/14  5:51 PM  Result Value Ref Range Status   Specimen Description SYNOVIAL RIGHT KNEE  Final   Special Requests B FLUID ON SWAB  Final   Acid Fast Smear   Final    NO ACID FAST BACILLI SEEN [facility]    Culture   Final    CULTURE WILL BE EXAMINED FOR 6 WEEKS BEFORE ISSUING A FINAL REPORT [facility]    Report Status PENDING  Incomplete  AFB culture with smear     Status: None (Preliminary result)   Collection Time: 10/26/14  5:51 PM  Result Value Ref Range Status   Specimen Description TISSUE RIGHT KNEE  Final   Special Requests D PT ON ZOSYN VANCOMYCIN  Final   Acid Fast Smear   Final    NO ACID FAST BACILLI SEEN [facility]    Culture   Final    CULTURE WILL BE EXAMINED FOR 6 WEEKS BEFORE ISSUING A FINAL REPORT Performed at [facility]      Report Status PENDING  Incomplete  Anaerobic culture     Status: None   Collection Time: 10/26/14  5:55 PM  Result Value Ref Range Status   Specimen Description TISSUE LEFT KNEE  Final   Special Requests C  Final   Gram Stain   Final    NO WBC SEEN FEW GRAM POSITIVE COCCI IN PAIRS FEW GRAM POSITIVE RODS [facility]    Culture   Final    NO ANAEROBES ISOLATED [facility]formed at [facility]    Report Status 10/31/2014 FINAL  Final  Tissue culture     Status: None   Collection Time: 10/26/14  5:55 PM  Result Value Ref Range Status   Specimen Description TISSUE LEFT KNEE  Final   Special Requests C  Final   Gram Stain   Final    NO WBC SEEN FEW GRAM POSITIVE COCCI IN PAIRS FEW GRAM POSITIVE RODS [facility]formed at [facility]    Culture   Final    ABUNDANT STAPHYLOCOCCUS AUREUS Note: RIFAMPIN AND GENTAMICIN SHOULD NOT BE USED AS SINGLE DRUGS FOR TREATMENT OF STAPH INFECTIONS. Performed at [facility]    Report Status 10/30/2014 FINAL  Final   Organism ID, Bacteria STAPHYLOCOCCUS AUREUS  Final      Susceptibility   Staphylococcus aureus - MIC*    CLINDAMYCIN <=0.25 SENSITIVE Sensitive     ERYTHROMYCIN <=0.25 SENSITIVE Sensitive     GENTAMICIN <=0.5 SENSITIVE Sensitive  LEVOFLOXACIN 0.25 SENSITIVE Sensitive     OXACILLIN 0.5 SENSITIVE Sensitive     PENICILLIN >=0.5 RESISTANT Resistant     RIFAMPIN <=0.5 SENSITIVE Sensitive     TRIMETH/SULFA <=10 SENSITIVE Sensitive     VANCOMYCIN 1 SENSITIVE Sensitive     TETRACYCLINE <=1 SENSITIVE Sensitive     MOXIFLOXACIN <=0.25 SENSITIVE Sensitive     * ABUNDANT STAPHYLOCOCCUS AUREUS  Fungus Culture with Smear     Status: None (Preliminary result)   Collection Time: 10/26/14  5:55 PM  Result Value Ref Range Status   Specimen Description TISSUE LEFT KNEE  Final   Special Requests C  Final   Fungal Smear   Final    NO YEAST OR FUNGAL ELEMENTS SEEN Performed at [facility]     Culture   Final    CULTURE IN PROGRESS FOR FOUR WEEKS Performed at [facility]    Report Status PENDING  Incomplete  AFB culture with smear     Status: None (Preliminary result)   Collection Time: 10/26/14  5:55 PM  Result Value Ref Range Status   Specimen Description TISSUE LEFT KNEE  Final   Special Requests C  Final   Acid Fast Smear   Final    NO ACID FAST BACILLI SEEN Performed at [facility]    Culture   Final    CULTURE WILL BE EXAMINED FOR 6 WEEKS BEFORE ISSUING A FINAL REPORT Performed at [facility]    Report Status PENDING  Incomplete  Anaerobic culture     Status: None   Collection Time: 10/26/14  5:57 PM  Result Value Ref Range Status   Specimen Description TISSUE RIGHT KNEE  Final   Special Requests D PT ON ZOSYN AND VANCOMYCIN  Final   Gram Stain   Final    NO WBC SEEN NO ORGANISMS SEEN Performed at [facility]    Culture   Final    NO ANAEROBES ISOLATED Performed at [facility]    Report Status 11/01/2014 FINAL  Final  Fungus Culture with Smear     Status: None (Preliminary result)   Collection Time: 10/26/14  5:57 PM  Result Value Ref Range Status   Specimen Description TISSUE RIGHT KNEE  Final   Special Requests D PT ON ZOSYN VANCOMYCIN  Final   Fungal Smear   Final    NO YEAST OR FUNGAL ELEMENTS SEEN Performed at [facility]    Culture   Final    CULTURE IN PROGRESS FOR FOUR WEEKS Performed at [facility]    Report Status PENDING  Incomplete  Tissue culture     Status: None   Collection Time: 10/26/14  5:57 PM  Result Value Ref Range Status   Specimen Description TISSUE RIGHT KNEE  Final   Special Requests D PT ON ZOSYN VANCOMYCIN  Final   Gram Stain   Final    NO WBC SEEN NO ORGANISMS SEEN Performed at [facility]    Culture   Final    FEW PSEUDOMONAS AERUGINOSA MODERATE STAPHYLOCOCCUS AUREUS Note: RIFAMPIN AND GENTAMICIN SHOULD NOT BE USED AS SINGLE DRUGS FOR  TREATMENT OF STAPH INFECTIONS. Performed at [facility]    Report Status 10/30/2014 FINAL  Final   Organism ID, Bacteria PSEUDOMONAS AERUGINOSA  Final   Organism ID, Bacteria STAPHYLOCOCCUS AUREUS  Final      Susceptibility   Staphylococcus aureus - MIC*    CLINDAMYCIN <=0.25 SENSITIVE Sensitive  ERYTHROMYCIN <=0.25 SENSITIVE Sensitive     GENTAMICIN <=0.5 SENSITIVE Sensitive     LEVOFLOXACIN 0.25 SENSITIVE Sensitive     OXACILLIN 0.5 SENSITIVE Sensitive     PENICILLIN >=0.5 RESISTANT Resistant     RIFAMPIN <=0.5 SENSITIVE Sensitive     TRIMETH/SULFA <=10 SENSITIVE Sensitive     VANCOMYCIN <=0.5 SENSITIVE Sensitive     TETRACYCLINE <=1 SENSITIVE Sensitive     MOXIFLOXACIN <=0.25 SENSITIVE Sensitive     * MODERATE STAPHYLOCOCCUS AUREUS   Pseudomonas aeruginosa - MIC*    CEFEPIME <=1 SENSITIVE Sensitive     CEFTAZIDIME 2 SENSITIVE Sensitive     CIPROFLOXACIN <=0.25 SENSITIVE Sensitive     GENTAMICIN <=1 SENSITIVE Sensitive     IMIPENEM 2 SENSITIVE Sensitive     PIP/TAZO <=4 SENSITIVE Sensitive     TOBRAMYCIN <=1 SENSITIVE Sensitive     * FEW PSEUDOMONAS AERUGINOSA  Surgical pcr screen     Status: Abnormal   Collection Time: 10/29/14  6:25 AM  Result Value Ref Range Status   MRSA, PCR NEGATIVE NEGATIVE Final   Staphylococcus aureus POSITIVE (A) NEGATIVE Final    Comment:        The Xpert SA Assay (FDA approved for NASAL specimens in patients over 23 years of age), is one component of a comprehensive surveillance program.  Test performance has been validated by [facility] for patients greater than or equal to 30 year old. It is not intended to diagnose infection nor to guide or monitor treatment.   Culture, blood (routine x 2)     Status: None   Collection Time: 10/29/14  6:35 PM  Result Value Ref Range Status   Specimen Description BLOOD LEFT ANTECUBITAL  Final   Special Requests BOTTLES DRAWN AEROBIC AND ANAEROBIC 10CC  Final   Culture NO GROWTH 5 DAYS   Final   Report Status 11/03/2014 FINAL  Final  Culture, blood (routine x 2)     Status: None   Collection Time: 10/29/14  6:45 PM  Result Value Ref Range Status   Specimen Description BLOOD LEFT HAND  Final   Special Requests BOTTLES DRAWN AEROBIC AND ANAEROBIC 10CC  Final   Culture NO GROWTH 5 DAYS  Final   Report Status 11/03/2014 FINAL  Final    Studies/Results: No results found.  Assessment/Plan:  1) MSSA bacteremia - on zosyn due to polymicrobial joint infection.  TEE without vegetation.   2) polymicrobial infection of both thighs and knees - Pseudomonas and MSSA.  VAC in place and plastic surgery to see.   Without vegetation, 2 weeks of zosyn through 7/28 will be adequate for MSSA in blood and leg infection.    I will sign off, please call with questions.   Staci Righter, MD [email or web address] [phone] pager   [phone] cell 11/03/2014, 2:42 PM

## 2014-11-03 NOTE — Progress Notes (Signed)
PT Cancellation Note  Patient Details Name: Timothy DubinWarren D Norman MRN: [id number] DOB: 05/22/1950   Cancelled Treatment:    Reason Eval/Treat Not Completed: Patient at procedure or test/unavailable   Currently in TEE; Will follow up later today as time allows;  Otherwise, will follow up for PT tomorrow;   Thank you,  Van ClinesHolly Johnie Stadel, PT  Acute Rehabilitation Services Pager [phone] Office [phone]     Van ClinesGarrigan, [name] 11/03/2014, 12:01 PM

## 2014-11-04 ENCOUNTER — Encounter (HOSPITAL_COMMUNITY): Payer: Self-pay | Admitting: Cardiovascular Disease

## 2014-11-04 LAB — PREALBUMIN: Prealbumin: 16.7 mg/dL — ABNORMAL LOW (ref 18–38)

## 2014-11-04 MED ORDER — KETOROLAC TROMETHAMINE 15 MG/ML IJ SOLN
INTRAMUSCULAR | Status: AC
  Filled 2014-11-04: qty 1

## 2014-11-04 NOTE — Progress Notes (Signed)
Family Medicine Teaching Service Daily Progress Note Intern Pager: [phone]  Patient name: Timothy Norman Medical record number: [id number] Date of birth: 01-12-1951 Age: 64 y.o. Gender: male  Primary Care Provider: No primary care provider on file. Consultants: Ortho, ID Code Status: FULL  Pt Overview and Major Events to Date:  07/12: Admitted to floor, Ortho debrided legs in OR 07/13: Wound VAC in place BL LE, ID consult 07/20: TEE showed no vegetations  Assessment and Plan: Timothy Norman is a 64 y.o. male presenting with severe bilateral necrotic leg wounds. PMH is significant for alcohol abuse.   Bilateral Necrotic Leg Wounds: Patient reports that wound started after a fall the week prior to admission, however appeared to be infected for months and maggots were removed from the wounds upon presentation to the ED. Patient is now s/p significant debridement and bilateral knee washout. Blood cultures have grown MSSA and wound cultures have grown pseudomonas.   -Vanc (7/12-7/14), Zosyn (7/12- ). Will need Zosyn until 28th per ID -PT/OT consulted, recommended SNF placement -Pain control with tylenol and IV morphine 4mg  q4hrs prn, oxycodone q6 -Orthopedics debrided and I/D, signed off on 7/18 -Wheelchair with supervision ordered so patient can leave room  -Per ortho note, plastics to see patient Monday (7/25?) for possible skin grafts and further wound management  -Called Dr. Kelly Splinter (plastics) office to see when she will be by to see patient, waiting to hear back. Appreciate their assistance.  Bacteremia; 2/2 Blood cultures positive for MSSA. HIV, RPR, hepatitis panel negative.  -ID consulted, appreciate assistance -Abx as above -Repeat blood cultures (7/15) pendingve -TTE with preserved EF, no obvious endocarditis.  -TEE: no vegetations    AMS/Cognitive Impairment. Patient noted to have abnormal behavior in the ED. Ethanol negative. UDS only positive for benzos and opiates which  patient received in ED. LFTs unremarkable. Psychiatric history unknown, possible schizophrenia.  Friend JB reports that this is the patient's baseline mental status. 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  -Monitor mental status -Call family member to get more information if needed, name is Timothy Norman (cousin): [phone]  Alcohol Abuse; Chronic -social work consulted -CIWA protocol discontinued - Thiamine, folate, multivitamin.   Malnutrition. Albumin 2.9 on admission. - Nutrition consulted, appreciate assistance.   Anemia -hgb 7.8 today, will monitor  FEN/GI: SLIV, regular PPx: Heparin  Disposition:  Anticipate discharge to SNF after seeing plastics  Subjective:  Patient is doing well this morning, no acute events overnight. His TEE went well yesterday. Patient is still sad and feels very confined to his bed. He denies leg pain at this time. Left a note for his nurse to call me to discuss wheelchair with assistance. It would be beneficial for patient to be able to wheel around the hallway with supervision. He is still seeing cars flying outside his window in the sky and he "waves to the people".   Objective: Temp:  [97.6 F (36.4 C)-98.2 F (36.8 C)] 97.8 F (36.6 C) (07/21 0522) Pulse Rate:  [75-90] 90 (07/21 0522) Resp:  [14-26] 18 (07/21 0522) BP: (110-152)/(66-93) 122/77 mmHg (07/21 0522) SpO2:  [94 %-100 %] 97 % (07/21 0522)   Physical Exam: General: NAD, sitting in bedside chair.  Cardiovascular: RRR, no murmurs Respiratory: clear to auscultation bilaterally, no increased WOB Abdomen: (+) BS, nontender, nondistended Extremities: wound vac in place in bilateral lower extremities, draining serosanginous fluid.  Neuro: alert. No focal neurologic deficits.  Psych: poor insight. (+) visual hallucinations  Laboratory/Micro/Diagnostic Studies:  Recent Results (from the past 240 hour(s))  Blood culture (routine x 2)     Status: None   Collection Time:  10/26/14 12:30 PM  Result Value Ref Range Status   Specimen Description BLOOD RIGHT ARM  Final   Special Requests BOTTLES DRAWN AEROBIC AND ANAEROBIC 5CC  Final   Culture  Setup Time   Final    GRAM POSITIVE COCCI IN CLUSTERS IN BOTH AEROBIC AND ANAEROBIC BOTTLES CRITICAL RESULT CALLED TO, READ BACK BY AND VERIFIED WITH: A.Ricki Miller [id number] 10/27/14 M.CAMPBELL GRAM STAIN REVIEWED-AGREE WITH RESULT S. YARBROUGH    Culture   Final    STAPHYLOCOCCUS AUREUS STAPHYLOCOCCUS SPECIES (COAGULASE NEGATIVE)    Report Status 10/31/2014 FINAL  Final   Organism ID, Bacteria STAPHYLOCOCCUS AUREUS  Final   Organism ID, Bacteria STAPHYLOCOCCUS SPECIES (COAGULASE NEGATIVE)  Final      Susceptibility   Staphylococcus aureus - MIC*    CIPROFLOXACIN <=0.5 SENSITIVE Sensitive     ERYTHROMYCIN <=0.25 SENSITIVE Sensitive     GENTAMICIN <=0.5 SENSITIVE Sensitive     OXACILLIN 0.5 SENSITIVE Sensitive     TETRACYCLINE <=1 SENSITIVE Sensitive     VANCOMYCIN <=0.5 SENSITIVE Sensitive     TRIMETH/SULFA <=10 SENSITIVE Sensitive     CLINDAMYCIN <=0.25 SENSITIVE Sensitive     RIFAMPIN <=0.5 SENSITIVE Sensitive     Inducible Clindamycin NEGATIVE Sensitive     * STAPHYLOCOCCUS AUREUS   Staphylococcus species (coagulase negative) - MIC*    CIPROFLOXACIN <=0.5 SENSITIVE Sensitive     ERYTHROMYCIN <=0.25 SENSITIVE Sensitive     GENTAMICIN <=0.5 SENSITIVE Sensitive     OXACILLIN <=0.25 SENSITIVE Sensitive     TETRACYCLINE <=1 SENSITIVE Sensitive     VANCOMYCIN <=0.5 SENSITIVE Sensitive     TRIMETH/SULFA <=10 SENSITIVE Sensitive     CLINDAMYCIN <=0.25 SENSITIVE Sensitive     RIFAMPIN <=0.5 SENSITIVE Sensitive     Inducible Clindamycin NEGATIVE Sensitive     * STAPHYLOCOCCUS SPECIES (COAGULASE NEGATIVE)  Blood culture (routine x 2)     Status: None   Collection Time: 10/26/14 12:30 PM  Result Value Ref Range Status   Specimen Description BLOOD LEFT ARM  Final   Special Requests BOTTLES DRAWN AEROBIC AND  ANAEROBIC 5CC  Final   Culture  Setup Time   Final    GRAM POSITIVE COCCI IN CLUSTERS AEROBIC BOTTLE ONLY CRITICAL RESULT CALLED TO, READ BACK BY AND VERIFIED WITH: A.Ricki Miller [id number] 10/27/14 M.CAMPBELL GRAM STAIN REVIEWED-AGREE WITH RESULT S. YARBROUGH    Culture   Final    STAPHYLOCOCCUS SPECIES (COAGULASE NEGATIVE) SUSCEPTIBILITIES PERFORMED ON PREVIOUS CULTURE WITHIN THE LAST 5 DAYS.    Report Status 10/31/2014 FINAL  Final  Body fluid culture     Status: None   Collection Time: 10/26/14  5:49 PM  Result Value Ref Range Status   Specimen Description SYNOVIAL LEFT KNEE  Final   Special Requests FLUID ON A SWAB  Final   Gram Stain   Final    FEW WBC PRESENT,BOTH PMN AND MONONUCLEAR NO ORGANISMS SEEN    Culture   Final    STAPHYLOCOCCUS AUREUS SUSCEPTIBILITIES PERFORMED ON PREVIOUS CULTURE WITHIN THE LAST 5 DAYS. STREPTOCOCCUS GROUP D;high probability for S.bovis SENSITIVITIES PERFORMED BY [facility]    Report Status 11/03/2014 FINAL  Final   Organism ID, Bacteria STREPTOCOCCUS GROUP D;high probability for S.bovis  Final      Susceptibility   Streptococcus group d;high probability for s.bovis - MIC (ETEST)*    PENICILLIN Value in next row  0.032SENSITIVE    * STREPTOCOCCUS GROUP D;high probability for S.bovis  Anaerobic culture     Status: None   Collection Time: 10/26/14  5:49 PM  Result Value Ref Range Status   Specimen Description SYNOVIAL LEFT KNEE  Final   Special Requests A FLUID ON SWAB  Final   Gram Stain   Final    FEW WBC PRESENT,BOTH PMN AND MONONUCLEAR NO ORGANISMS SEEN    Culture NO ANAEROBES ISOLATED  Final   Report Status 11/02/2014 FINAL  Final  Fungus Culture with Smear     Status: None (Preliminary result)   Collection Time: 10/26/14  5:49 PM  Result Value Ref Range Status   Specimen Description SYNOVIAL LEFT KNEE  Final   Special Requests A FLUID ON SWAB  Final   Fungal Smear   Final    NO YEAST OR FUNGAL ELEMENTS SEEN Performed at  [facility]    Culture   Final    CULTURE IN PROGRESS FOR FOUR WEEKS Performed at [facility]    Report Status PENDING  Incomplete  AFB culture with smear     Status: None (Preliminary result)   Collection Time: 10/26/14  5:49 PM  Result Value Ref Range Status   Specimen Description SYNOVIAL LEFT KNEE  Final   Special Requests A FLUID ON SWAB  Final   Acid Fast Smear   Final    NO ACID FAST BACILLI SEEN Performed at [facility]    Culture   Final    CULTURE WILL BE EXAMINED FOR 6 WEEKS BEFORE ISSUING A FINAL REPORT Performed at [facility]    Report Status PENDING  Incomplete  Anaerobic culture     Status: None (Preliminary result)   Collection Time: 10/26/14  5:51 PM  Result Value Ref Range Status   Specimen Description SYNOVIAL RIGHT KNEE  Final   Special Requests B FLUID ON SWAB  Final   Gram Stain   Final    FEW WBC PRESENT,BOTH PMN AND MONONUCLEAR NO ORGANISMS SEEN    Culture FINEGOLDIA MAGNA  Final   Report Status PENDING  Incomplete  Body fluid culture     Status: None   Collection Time: 10/26/14  5:51 PM  Result Value Ref Range Status   Specimen Description SYNOVIAL RIGHT KNEE  Final   Special Requests B FLUID ON SWAB  Final   Gram Stain   Final    FEW WBC PRESENT,BOTH PMN AND MONONUCLEAR NO ORGANISMS SEEN    Culture NO GROWTH 3 DAYS  Final   Report Status 10/30/2014 FINAL  Final  Fungus Culture with Smear     Status: None (Preliminary result)   Collection Time: 10/26/14  5:51 PM  Result Value Ref Range Status   Specimen Description SYNOVIAL RIGHT KNEE  Final   Special Requests B FLUID ON SWAB  Final   Fungal Smear   Final    NO YEAST OR FUNGAL ELEMENTS SEEN Performed at [facility]    Culture   Final    CULTURE IN PROGRESS FOR FOUR WEEKS Performed at [facility]    Report Status PENDING  Incomplete  AFB culture with smear     Status: None (Preliminary result)   Collection Time: 10/26/14  5:51 PM   Result Value Ref Range Status   Specimen Description SYNOVIAL RIGHT KNEE  Final   Special Requests B FLUID ON SWAB  Final   Acid Fast Smear   Final  NO ACID FAST BACILLI SEEN Performed at [facility]    Culture   Final    CULTURE WILL BE EXAMINED FOR 6 WEEKS BEFORE ISSUING A FINAL REPORT Performed at [facility]    Report Status PENDING  Incomplete  AFB culture with smear     Status: None (Preliminary result)   Collection Time: 10/26/14  5:51 PM  Result Value Ref Range Status   Specimen Description TISSUE RIGHT KNEE  Final   Special Requests D PT ON ZOSYN VANCOMYCIN  Final   Acid Fast Smear   Final    NO ACID FAST BACILLI SEEN Performed at [facility]    Culture   Final    CULTURE WILL BE EXAMINED FOR 6 WEEKS BEFORE ISSUING A FINAL REPORT Performed at [facility]    Report Status PENDING  Incomplete  Anaerobic culture     Status: None   Collection Time: 10/26/14  5:55 PM  Result Value Ref Range Status   Specimen Description TISSUE LEFT KNEE  Final   Special Requests C  Final   Gram Stain   Final    NO WBC SEEN FEW GRAM POSITIVE COCCI IN PAIRS FEW GRAM POSITIVE RODS Performed at [facility]    Culture   Final    NO ANAEROBES ISOLATED Performed at [facility]    Report Status 10/31/2014 FINAL  Final  Tissue culture     Status: None   Collection Time: 10/26/14  5:55 PM  Result Value Ref Range Status   Specimen Description TISSUE LEFT KNEE  Final   Special Requests C  Final   Gram Stain   Final    NO WBC SEEN FEW GRAM POSITIVE COCCI IN PAIRS FEW GRAM POSITIVE RODS Performed at [facility]    Culture   Final    ABUNDANT STAPHYLOCOCCUS AUREUS Note: RIFAMPIN AND GENTAMICIN SHOULD NOT BE USED AS SINGLE DRUGS FOR TREATMENT OF STAPH INFECTIONS. Performed at [facility]    Report Status 10/30/2014 FINAL  Final   Organism ID, Bacteria STAPHYLOCOCCUS AUREUS  Final      Susceptibility    Staphylococcus aureus - MIC*    CLINDAMYCIN <=0.25 SENSITIVE Sensitive     ERYTHROMYCIN <=0.25 SENSITIVE Sensitive     GENTAMICIN <=0.5 SENSITIVE Sensitive     LEVOFLOXACIN 0.25 SENSITIVE Sensitive     OXACILLIN 0.5 SENSITIVE Sensitive     PENICILLIN >=0.5 RESISTANT Resistant     RIFAMPIN <=0.5 SENSITIVE Sensitive     TRIMETH/SULFA <=10 SENSITIVE Sensitive     VANCOMYCIN 1 SENSITIVE Sensitive     TETRACYCLINE <=1 SENSITIVE Sensitive     MOXIFLOXACIN <=0.25 SENSITIVE Sensitive     * ABUNDANT STAPHYLOCOCCUS AUREUS  Fungus Culture with Smear     Status: None (Preliminary result)   Collection Time: 10/26/14  5:55 PM  Result Value Ref Range Status   Specimen Description TISSUE LEFT KNEE  Final   Special Requests C  Final   Fungal Smear   Final    NO YEAST OR FUNGAL ELEMENTS SEEN Performed at [facility]    Culture   Final    CULTURE IN PROGRESS FOR FOUR WEEKS Performed at [facility]    Report Status PENDING  Incomplete  AFB culture with smear     Status: None (Preliminary result)   Collection Time: 10/26/14  5:55 PM  Result Value Ref Range Status   Specimen Description TISSUE LEFT KNEE  Final  Special Requests C  Final   Acid Fast Smear   Final    NO ACID FAST BACILLI SEEN Performed at [facility]    Culture   Final    CULTURE WILL BE EXAMINED FOR 6 WEEKS BEFORE ISSUING A FINAL REPORT Performed at [facility]    Report Status PENDING  Incomplete  Anaerobic culture     Status: None   Collection Time: 10/26/14  5:57 PM  Result Value Ref Range Status   Specimen Description TISSUE RIGHT KNEE  Final   Special Requests D PT ON ZOSYN AND VANCOMYCIN  Final   Gram Stain   Final    NO WBC SEEN NO ORGANISMS SEEN Performed at [facility]    Culture   Final    NO ANAEROBES ISOLATED Performed at [facility]    Report Status 11/01/2014 FINAL  Final  Fungus Culture with Smear     Status: None (Preliminary result)   Collection  Time: 10/26/14  5:57 PM  Result Value Ref Range Status   Specimen Description TISSUE RIGHT KNEE  Final   Special Requests D PT ON ZOSYN VANCOMYCIN  Final   Fungal Smear   Final    NO YEAST OR FUNGAL ELEMENTS SEEN Performed at [facility]    Culture   Final    CULTURE IN PROGRESS FOR FOUR WEEKS Performed at [facility]    Report Status PENDING  Incomplete  Tissue culture     Status: None   Collection Time: 10/26/14  5:57 PM  Result Value Ref Range Status   Specimen Description TISSUE RIGHT KNEE  Final   Special Requests D PT ON ZOSYN VANCOMYCIN  Final   Gram Stain   Final    NO WBC SEEN NO ORGANISMS SEEN Performed at [facility]    Culture   Final    FEW PSEUDOMONAS AERUGINOSA MODERATE STAPHYLOCOCCUS AUREUS Note: RIFAMPIN AND GENTAMICIN SHOULD NOT BE USED AS SINGLE DRUGS FOR TREATMENT OF STAPH INFECTIONS. Performed at [facility]    Report Status 10/30/2014 FINAL  Final   Organism ID, Bacteria PSEUDOMONAS AERUGINOSA  Final   Organism ID, Bacteria STAPHYLOCOCCUS AUREUS  Final      Susceptibility   Staphylococcus aureus - MIC*    CLINDAMYCIN <=0.25 SENSITIVE Sensitive     ERYTHROMYCIN <=0.25 SENSITIVE Sensitive     GENTAMICIN <=0.5 SENSITIVE Sensitive     LEVOFLOXACIN 0.25 SENSITIVE Sensitive     OXACILLIN 0.5 SENSITIVE Sensitive     PENICILLIN >=0.5 RESISTANT Resistant     RIFAMPIN <=0.5 SENSITIVE Sensitive     TRIMETH/SULFA <=10 SENSITIVE Sensitive     VANCOMYCIN <=0.5 SENSITIVE Sensitive     TETRACYCLINE <=1 SENSITIVE Sensitive     MOXIFLOXACIN <=0.25 SENSITIVE Sensitive     * MODERATE STAPHYLOCOCCUS AUREUS   Pseudomonas aeruginosa - MIC*    CEFEPIME <=1 SENSITIVE Sensitive     CEFTAZIDIME 2 SENSITIVE Sensitive     CIPROFLOXACIN <=0.25 SENSITIVE Sensitive     GENTAMICIN <=1 SENSITIVE Sensitive     IMIPENEM 2 SENSITIVE Sensitive     PIP/TAZO <=4 SENSITIVE Sensitive     TOBRAMYCIN <=1 SENSITIVE Sensitive     * FEW PSEUDOMONAS  AERUGINOSA  Surgical pcr screen     Status: Abnormal   Collection Time: 10/29/14  6:25 AM  Result Value Ref Range Status   MRSA, PCR NEGATIVE NEGATIVE Final   Staphylococcus aureus POSITIVE (A) NEGATIVE Final    Comment:  The Xpert SA Assay (FDA approved for NASAL specimens in patients over 34 years of age), is one component of a comprehensive surveillance program.  Test performance has been validated by [facility] for patients greater than or equal to 25 year old. It is not intended to diagnose infection nor to guide or monitor treatment.   Culture, blood (routine x 2)     Status: None   Collection Time: 10/29/14  6:35 PM  Result Value Ref Range Status   Specimen Description BLOOD LEFT ANTECUBITAL  Final   Special Requests BOTTLES DRAWN AEROBIC AND ANAEROBIC 10CC  Final   Culture NO GROWTH 5 DAYS  Final   Report Status 11/03/2014 FINAL  Final  Culture, blood (routine x 2)     Status: None   Collection Time: 10/29/14  6:45 PM  Result Value Ref Range Status   Specimen Description BLOOD LEFT HAND  Final   Special Requests BOTTLES DRAWN AEROBIC AND ANAEROBIC 10CC  Final   Culture NO GROWTH 5 DAYS  Final   Report Status 11/03/2014 FINAL  Final     Beaulah Dinning, MD 11/04/2014, 7:22 AM PGY-1, [facility] FPTS Intern pager: [phone], text pages welcome

## 2014-11-04 NOTE — Consult Note (Addendum)
Reason for Consult: leg wounds Referring Physician: Valgene Deloatch Timothy Norman is an 64 y.o. male.  HPI: The patient is a 64 yrs bm here for treatment of his bilateral lower extremity wounds.  The history from the patient is not clear but the charts indicate there was a fall.  The wounds appear to be old and the cultures were positive as indicated below.  VACs are in place with an excellent seal.  There is no sign of cellulitis.  The patient has mild discomfort but is doing better than a few days ago.    Past Medical History  Diagnosis Date  . ETOH abuse     Past Surgical History  Procedure Laterality Date  . Infected skin debridement Bilateral 10/26/2014  . I&d extremity Bilateral 10/26/2014    Procedure: IRRIGATION AND DEBRIDEMENT BILATERAL LEGS;  Surgeon: Tarry Kos, MD;  Location: [facility] OR;  Service: Orthopedics;  Laterality: Bilateral;  . I&d extremity Bilateral 10/29/2014    Procedure: IRRIGATION AND DEBRIDEMENT BILATERAL KNEES WITH VAC CHANGE;  Surgeon: Tarry Kos, MD;  Location: [facility] OR;  Service: Orthopedics;  Laterality: Bilateral;  . Tee without cardioversion N/A 11/03/2014    Procedure: TRANSESOPHAGEAL ECHOCARDIOGRAM (TEE);  Surgeon: Wendall Stade, MD;  Location[facility] ENDOSCOPY;  Service: Cardiovascular;  Laterality: N/A;    History reviewed. No pertinent family history.  Social History:  reports that he has been smoking Cigarettes.  He has been smoking about 0.25 packs per day. He has never used smokeless tobacco. He reports that he drinks about 1.2 oz of alcohol per week. He reports that he does not use illicit drugs.  Allergies: No Known Allergies  Medications: I have reviewed the patient's current medications.  Results for orders placed or performed during the hospital encounter of 10/26/14 (from the past 48 hour(s))  CBC     Status: Abnormal   Collection Time: 11/03/14  2:54 PM  Result Value Ref Range   WBC 7.6 4.0 - 10.5 K/uL   RBC 2.71 (L) 4.22 - 5.81 MIL/uL   Hemoglobin 7.8 (L) 13.0 - 17.0 g/dL   HCT 16.1 (L) 09.6 - 04.5 %   MCV 89.7 78.0 - 100.0 fL   MCH 28.8 26.0 - 34.0 pg   MCHC 32.1 30.0 - 36.0 g/dL   RDW 40.9 81.1 - 91.4 %   Platelets 485 (H) 150 - 400 K/uL    No results found.  Review of Systems  Constitutional: Negative.   HENT: Negative.   Eyes: Negative.   Respiratory: Negative.   Gastrointestinal: Negative.   Genitourinary: Negative.   Musculoskeletal: Positive for joint pain.  Skin: Negative.   Psychiatric/Behavioral: Negative.    Blood pressure 122/77, pulse 90, temperature 97.8 F (36.6 C), temperature source Oral, resp. rate 18, height  (1.676 m), weight 68.5 kg (151 lb 0.2 oz), SpO2 97 %. Physical Exam  Constitutional: He appears well-developed.  HENT:  Head: Normocephalic and atraumatic.  Eyes: Conjunctivae and EOM are normal. Pupils are equal, round, and reactive to light.  Cardiovascular: Normal rate.   Respiratory: Effort normal.  Musculoskeletal:       Legs: Neurological: He is alert.  Psychiatric: He has a normal mood and affect.    Assessment/Plan: Plan for placement and will bring to the OR next week for for further debridement.  Maximize nutritional status with Vit C, Zinc and a multivitamin.  Protein supplements as able. Check a prealbumin.  Will need the wound VAC for discharge and likely for  the next 1-2 months.  Will use after surgery for Acell placement or skin graft placement.  Timothy Norman,Timothy Norman 11/04/2014, 8:35 PM

## 2014-11-04 NOTE — Progress Notes (Addendum)
If patient does not need wound vac for transfer to snf, will need to do wet to dry dressing then when get to snf they can connect patient to wound vac.  If patient needs wound vac for transfer to snf, NCM will  contact Rickie with [employer]  And she will take care of the vac for transfer to snf.  ( patient would need one wound vac with a y connector at snf  Instead of two wound vacs)

## 2014-11-04 NOTE — Progress Notes (Signed)
Physical Therapy Treatment Patient Details Name: Timothy Norman MRN: [id number] DOB: 11-11-1950 Today's Date: 11/04/2014    History of Present Illness Pt adm with severe bil thigh wounds and lt septic knee. Pt with maggots in wounds. Pt underwent surgical debridement of wounds and VAC placement 10/28/14. Pt also with AMS. PMH - etoh abuse.    PT Comments    Pt initially wanting to get up OOB to chair, but after attempting to do so without assistance (per his request) he became increasingly frustrated and then refused help to get to the chair. The pt was reminded that the last time we got up to the chair he required help. He did not seem to remember this and reported, "I want to do it on my own."  He then decided that he would try again "later".  I made RN tech aware to find me if pt asks to get up later and I will facilitate the transfer to the chair.   Follow Up Recommendations  SNF     Equipment Recommendations  Wheelchair (measurements PT);Wheelchair cushion (measurements PT);Hospital bed    Recommendations for Other Services   NA     Precautions / Restrictions Precautions Precautions: Fall Restrictions Weight Bearing Restrictions: No    Mobility  Bed Mobility Overal bed mobility: Needs Assistance Bed Mobility: Rolling;Supine to Sit Rolling: Modified independent (Device/Increase time)   Supine to sit: Modified independent (Device/Increase time)     General bed mobility comments: Pt attempting to sit up in bed unassisted using bed rails and HOB mildly elevated.  Pt becoming increasingly frustrated, but would not let PT or tech help him with mobility.  "I want to try to do it on my own!"  With continued encouragement to let us assit he became more frustrated and aggitated until he decided he did not want to get up OOB.    Transfers                 General transfer comment: pt refused after getting frustrated when he could not transfer independently.                              Cognition Arousal/Alertness: Awake/alert Behavior During Therapy: Agitated;Restless Overall Cognitive Status: No family/caregiver present to determine baseline cognitive functioning     Current Attention Level: Sustained           General Comments: Pt very indecisive, speaking at times, not speaking at other times when clearly heard.  Very odd behavior.  At times aggitated, " I have a lot on my mind"    Exercises General Exercises - Lower Extremity Ankle Circles/Pumps: AAROM;Both;10 reps;Supine Heel Slides: Other (comment) (attempted, but pt guarded and yelling "stop" due to pain)        Pertinent Vitals/Pain Pain Assessment: Faces Faces Pain Scale: Hurts even more Pain Location: bil knees with movement of bil feet Pain Descriptors / Indicators: Aching;Burning Pain Intervention(s): Limited activity within patient's tolerance;Monitored during session;Repositioned           PT Goals (current goals can now be found in the care plan section) Acute Rehab PT Goals Patient Stated Goal: none stated Progress towards PT goals: Not progressing toward goals - comment (due to aggitation and refusal of help today)    Frequency  Min 3X/week    PT Plan Current plan remains appropriate       End of Session   Activity Tolerance: No increased pain;Treatment  limited secondary to agitation Patient left: in bed;with call bell/phone within reach     Time: 1055-1111 PT Time Calculation (min) (ACUTE ONLY): 16 min  Charges:  $Therapeutic Activity: 8-22 mins                      Jill Ruppe B. Lavanda Nevels, PT, DPT [phone]   11/04/2014, 12:09 PM

## 2014-11-04 NOTE — Clinical Documentation Improvement (Signed)
Please clarify if "acute renal insufficiency" as currently documented in the chart can be further specified.  Labs as below.    Component      BUN Creatinine  Latest Ref Rng      6 - 20 mg/dL 0.61 - 1.24 mg/dL  10/26/2014     12:30 PM 24 (H) 1.44 (H)  10/27/2014     5:50 AM 25 (H) 1.84 (H)  10/31/2014      12 1.18   Component      EGFR (Non-African Amer.) EGFR (African American)  Latest Ref Rng      >60 mL/min >60 mL/min  10/26/2014     12:30 PM 50 (L) 58 (L)  10/27/2014     5:50 AM 37 (L) 43 (L)  10/31/2014      >60 >60   Possible Clinical Conditions: -Acute kidney injury / acute renal failure -Acute renal insufficiency only as currently documented -Other condition ( please specify) -Unable to determine at present  Thank you, Mateo Flow, RN [phone] Clinical Documentation Specialist

## 2014-11-04 NOTE — Progress Notes (Signed)
Pt very agitated when RN and NT went to the room to give medications and take VS. Pt refused his meds and refused VS to be taken and would like to sleep and not be disturbed. RN will continue to monitor.

## 2014-11-04 NOTE — Clinical Social Work Note (Signed)
CSW met with patient at bedside. Patient states he is agreeable to [facility] SNF at discharge. He states, "I don't really have a choice. I can't go home like this." CSW has left report for covering CSW.   Liz Beach MSW, [place], [place], [phone]

## 2014-11-05 MED ORDER — OXYCODONE HCL 5 MG PO TABS: 5.0000 mg | ORAL_TABLET | Freq: Four times a day (QID) | ORAL | Status: DC

## 2014-11-05 MED ORDER — ENSURE ENLIVE PO LIQD: 237.0000 mL | Freq: Three times a day (TID) | ORAL | Status: DC

## 2014-11-05 MED ORDER — PRO-STAT SUGAR FREE PO LIQD: 30.0000 mL | Freq: Two times a day (BID) | ORAL | Status: DC

## 2014-11-05 MED ORDER — DIPHENHYDRAMINE HCL 25 MG PO CAPS: 25.0000 mg | ORAL_CAPSULE | Freq: Four times a day (QID) | ORAL | Status: DC | PRN

## 2014-11-05 MED ORDER — SODIUM CHLORIDE 0.9 % IJ SOLN: 10.0000 mL | INTRAMUSCULAR | Status: DC | PRN

## 2014-11-05 MED ORDER — POLYETHYLENE GLYCOL 3350 17 G PO PACK: 17.0000 g | PACK | Freq: Every day | ORAL | Status: DC | PRN

## 2014-11-05 MED ORDER — FOLIC ACID 1 MG PO TABS: 1.0000 mg | ORAL_TABLET | Freq: Every day | ORAL | Status: AC

## 2014-11-05 MED ORDER — SODIUM CHLORIDE 0.9 % IJ SOLN: 10.0000 mL | Freq: Two times a day (BID) | INTRAMUSCULAR | Status: DC

## 2014-11-05 MED ORDER — CHLORHEXIDINE GLUCONATE CLOTH 2 % EX PADS: 6.0000 | MEDICATED_PAD | Freq: Every day | CUTANEOUS | Status: DC

## 2014-11-05 MED ORDER — THIAMINE HCL 100 MG PO TABS: 100.0000 mg | ORAL_TABLET | Freq: Every day | ORAL | Status: AC

## 2014-11-05 MED ORDER — PIPERACILLIN-TAZOBACTAM 3.375 G IVPB: 3.3750 g | Freq: Three times a day (TID) | INTRAVENOUS | Status: DC

## 2014-11-05 NOTE — Progress Notes (Signed)
Patient discharging to [facility] facility via ambulance. Negative pressure therapy clamped, Picc to Right arm intact. Pt received discharge instructions and education. Vitals stable for patient. IV to right forearm dc'd. Condom cath intact and bag emptied. Report called to RN Chaquita at facility. 11/05/2014 6:49 PM Lachanda Buczek

## 2014-11-05 NOTE — Progress Notes (Signed)
Utilization Review completed. Catrina Fellenz RN BSN CM 

## 2014-11-05 NOTE — Discharge Instructions (Signed)
You were admitted for infection of both of your legs. You will be sent to the skilled nursing facility with antibiotic medication and wound vacs on your legs. You will be seen by Dr. Kelly Splinter for further wound care management.

## 2014-11-05 NOTE — Progress Notes (Signed)
Pt resting comfortably, denies pain, calm and cooperative this time when RN came in, refused his scheduled Oxycodone.

## 2014-11-05 NOTE — Progress Notes (Signed)
Peripherally Inserted Central Catheter/Midline Placement  The IV Nurse has discussed with the patient and/or persons authorized to consent for the patient, the purpose of this procedure and the potential benefits and risks involved with this procedure.  The benefits include less needle sticks, lab draws from the catheter and patient may be discharged home with the catheter.  Risks include, but not limited to, infection, bleeding, blood clot (thrombus formation), and puncture of an artery; nerve damage and irregular heat beat.  Alternatives to this procedure were also discussed.  PICC/Midline Placement Documentation  PICC / Midline Single Lumen 11/05/14 PICC Right Brachial 41 cm 2 cm (Active)  Indication for Insertion or Continuance of Line Home intravenous therapies (PICC only) 11/05/2014  5:04 PM  Exposed Catheter (cm) 2 cm 11/05/2014  5:04 PM  Site Assessment Clean;Dry;Intact 11/05/2014  5:04 PM  Line Status Flushed;Saline locked;Blood return noted 11/05/2014  5:04 PM  Dressing Type Transparent 11/05/2014  5:04 PM  Dressing Status Clean;Dry;Intact 11/05/2014  5:04 PM  Dressing Change Due 11/12/14 11/05/2014  5:04 PM       Ethelda Chick 11/05/2014, 5:05 PM

## 2014-11-05 NOTE — Consult Note (Addendum)
WOC wound consult note Reason for Consult: Consult requested for bilat knee Vac dressing change.  Pt was previously followed by ortho service, who applied BLE Vac dressings in the OR.  They have signed off the case at this time.  Plastics consult was performed and they plan to follow as an outpatient; Vacs were not removed to assess wounds or change dressings during their consult on 7/21.  WOC consult has been requested today to change Vac dressings pior to discharge to SNF.  Vac dressings were last changed in the OR on 7/18. Pt was medicated prior to procedure but it was very painful and he was yelling during the process. Wound type: Full thickness post-op wounds to bilat knees and thighs. Measurement: Left knee 20X9X.2cm, exposed bone over patella, surrounding wound beefy red.  Left thigh with suture line above wound, 18X1cm, well approximated and no drainage, erythremia, or drainage.   Right knee 11X10cm, exposed bone over patella, surrounding wound beefy red. There is a tightly adhered piece of Vac foam which could not be removed from the bone in the middle of the wound; .5X.5cm. Right thigh with suture line above wound, 16X1cm, well approximated and no drainage, erythremia, or drainage.   Both dressings have been Yed together to one machine, which has small amt dark red drainage, no odor.  Dressing procedure/placement/frequency: Applied Mepitel contact layer over all wounds to decrease adherence of Vac sponge and minimize discomfort with next dressing change.  Applied one piece of black foam over each knee and Yed together to cont suction.  Applied gauze pads and tape over Mepitel to thigh wounds. Plan for SNF to change dressings Q M/W/F.  Pt is scheduled to follow-up with Dr Kelly Splinter of the plastics team for wound closure in the OR; refer to her progress notes. Adhered piece of black sponge to right knee can be removed at that time.  Discussed plan of care with patient but he does not appear to  understand. One hour spent performing this consult with bedside nurse assistance. Please re-consult if further assistance is needed.  Thank-you,  Cammie Mcgee MSN, RN, CWOCN, [place], CNS [phone]

## 2014-11-13 LAB — ANAEROBIC CULTURE: CULTURE: NEGATIVE

## 2014-11-23 LAB — FUNGUS CULTURE W SMEAR
FUNGAL SMEAR: NONE SEEN
FUNGAL SMEAR: NONE SEEN
Fungal Smear: NONE SEEN
Fungal Smear: NONE SEEN

## 2014-11-30 ENCOUNTER — Encounter (HOSPITAL_BASED_OUTPATIENT_CLINIC_OR_DEPARTMENT_OTHER): Payer: No Typology Code available for payment source | Attending: General Surgery

## 2014-11-30 DIAGNOSIS — L97114 Non-pressure chronic ulcer of right thigh with necrosis of bone: Secondary | ICD-10-CM | POA: Insufficient documentation

## 2014-11-30 DIAGNOSIS — F1014 Alcohol abuse with alcohol-induced mood disorder: Secondary | ICD-10-CM | POA: Insufficient documentation

## 2014-11-30 DIAGNOSIS — L97124 Non-pressure chronic ulcer of left thigh with necrosis of bone: Secondary | ICD-10-CM | POA: Insufficient documentation

## 2014-11-30 DIAGNOSIS — D649 Anemia, unspecified: Secondary | ICD-10-CM | POA: Insufficient documentation

## 2014-11-30 DIAGNOSIS — M1990 Unspecified osteoarthritis, unspecified site: Secondary | ICD-10-CM | POA: Insufficient documentation

## 2014-11-30 DIAGNOSIS — F17208 Nicotine dependence, unspecified, with other nicotine-induced disorders: Secondary | ICD-10-CM | POA: Insufficient documentation

## 2014-12-08 LAB — AFB CULTURE WITH SMEAR (NOT AT ARMC)
ACID FAST SMEAR: NONE SEEN
ACID FAST SMEAR: NONE SEEN

## 2014-12-12 LAB — AFB CULTURE WITH SMEAR (NOT AT ARMC): Acid Fast Smear: NONE SEEN

## 2014-12-15 DIAGNOSIS — F17208 Nicotine dependence, unspecified, with other nicotine-induced disorders: Secondary | ICD-10-CM | POA: Diagnosis not present

## 2014-12-15 DIAGNOSIS — D649 Anemia, unspecified: Secondary | ICD-10-CM | POA: Diagnosis not present

## 2014-12-15 DIAGNOSIS — L97114 Non-pressure chronic ulcer of right thigh with necrosis of bone: Secondary | ICD-10-CM | POA: Diagnosis present

## 2014-12-15 DIAGNOSIS — M1990 Unspecified osteoarthritis, unspecified site: Secondary | ICD-10-CM | POA: Diagnosis not present

## 2014-12-15 DIAGNOSIS — F1014 Alcohol abuse with alcohol-induced mood disorder: Secondary | ICD-10-CM | POA: Diagnosis not present

## 2014-12-15 DIAGNOSIS — L97124 Non-pressure chronic ulcer of left thigh with necrosis of bone: Secondary | ICD-10-CM | POA: Diagnosis not present

## 2015-01-02 LAB — AFB CULTURE WITH SMEAR (NOT AT ARMC): ACID FAST SMEAR: NONE SEEN

## 2015-01-03 ENCOUNTER — Encounter (HOSPITAL_BASED_OUTPATIENT_CLINIC_OR_DEPARTMENT_OTHER): Payer: No Typology Code available for payment source | Attending: Plastic Surgery

## 2015-01-03 DIAGNOSIS — L97124 Non-pressure chronic ulcer of left thigh with necrosis of bone: Secondary | ICD-10-CM | POA: Diagnosis not present

## 2015-01-03 DIAGNOSIS — F17208 Nicotine dependence, unspecified, with other nicotine-induced disorders: Secondary | ICD-10-CM | POA: Diagnosis not present

## 2015-01-03 DIAGNOSIS — F1014 Alcohol abuse with alcohol-induced mood disorder: Secondary | ICD-10-CM | POA: Insufficient documentation

## 2015-01-03 DIAGNOSIS — L97114 Non-pressure chronic ulcer of right thigh with necrosis of bone: Secondary | ICD-10-CM | POA: Insufficient documentation

## 2015-01-03 DIAGNOSIS — D649 Anemia, unspecified: Secondary | ICD-10-CM | POA: Insufficient documentation

## 2015-01-03 DIAGNOSIS — M1990 Unspecified osteoarthritis, unspecified site: Secondary | ICD-10-CM | POA: Insufficient documentation

## 2015-01-13 ENCOUNTER — Other Ambulatory Visit: Payer: Self-pay | Admitting: Plastic Surgery

## 2015-01-13 DIAGNOSIS — L97902 Non-pressure chronic ulcer of unspecified part of unspecified lower leg with fat layer exposed: Secondary | ICD-10-CM

## 2015-01-17 ENCOUNTER — Encounter (HOSPITAL_BASED_OUTPATIENT_CLINIC_OR_DEPARTMENT_OTHER): Payer: Medicare Other | Attending: Plastic Surgery

## 2015-01-17 DIAGNOSIS — X58XXXA Exposure to other specified factors, initial encounter: Secondary | ICD-10-CM | POA: Diagnosis not present

## 2015-01-17 DIAGNOSIS — M1990 Unspecified osteoarthritis, unspecified site: Secondary | ICD-10-CM | POA: Insufficient documentation

## 2015-01-17 DIAGNOSIS — S81001A Unspecified open wound, right knee, initial encounter: Secondary | ICD-10-CM | POA: Diagnosis not present

## 2015-01-17 DIAGNOSIS — S81002A Unspecified open wound, left knee, initial encounter: Secondary | ICD-10-CM | POA: Diagnosis not present

## 2015-01-18 ENCOUNTER — Encounter (HOSPITAL_COMMUNITY): Payer: Self-pay | Admitting: *Deleted

## 2015-01-18 MED ORDER — CEFAZOLIN SODIUM-DEXTROSE 2-3 GM-% IV SOLR
2.0000 g | INTRAVENOUS | Status: AC
  Administered 2015-01-19: 2 g via INTRAVENOUS
  Filled 2015-01-18: qty 50

## 2015-01-18 NOTE — Progress Notes (Signed)
Received MAR and discharge diagnosis/problem list from [facility]. Faxed pre-op instructions to [facility], nurse at [facility].

## 2015-01-18 NOTE — Progress Notes (Signed)
Spoke with Nurse, children's at [facility].New arrival time of 1230.Verbalized understanding.

## 2015-01-18 NOTE — Pre-Procedure Instructions (Signed)
    Eden Rho Palin  01/18/2015     Mr. Wigal's procedure is scheduled for tomorrow, January 19, 2015 at 12:30 PM.   Report to [facility] Entrance "A" Admitting Office at 10:00 AM.   Call this number if you have problems the morning of surgery: [phone]  Remember:  Patient is not to eat food or drink liquids after midnight tonight. (Includes chewing gum and hard candy)  Take these medicines the morning of surgery with A SIP OF WATER: Oxycodone - if needed   Do not wear jewelry.  Do not wear lotions, powders, or cologne  Patient may wear deodorant.  Men may shave face and neck.  Do not bring valuables to the hospital.  [facility] is not responsible for any belongings or valuables.  Contacts, dentures or bridgework may not be worn into surgery.

## 2015-01-19 ENCOUNTER — Ambulatory Visit (HOSPITAL_COMMUNITY): Payer: Medicare Other | Admitting: Anesthesiology

## 2015-01-19 ENCOUNTER — Encounter (HOSPITAL_COMMUNITY): Payer: Self-pay | Admitting: Plastic Surgery

## 2015-01-19 ENCOUNTER — Ambulatory Visit (HOSPITAL_COMMUNITY)
Admission: RE | Admit: 2015-01-19 | Discharge: 2015-01-19 | Disposition: A | Discharge status: Home / Self Care | Payer: Medicare Other | Source: Ambulatory Visit | Attending: Plastic Surgery | Admitting: Plastic Surgery

## 2015-01-19 ENCOUNTER — Encounter (HOSPITAL_COMMUNITY)
Admission: RE | Disposition: A | Discharge status: Home / Self Care | Payer: Self-pay | Source: Ambulatory Visit | Attending: Plastic Surgery

## 2015-01-19 DIAGNOSIS — Z87891 Personal history of nicotine dependence: Secondary | ICD-10-CM | POA: Insufficient documentation

## 2015-01-19 DIAGNOSIS — L97829 Non-pressure chronic ulcer of other part of left lower leg with unspecified severity: Secondary | ICD-10-CM | POA: Insufficient documentation

## 2015-01-19 DIAGNOSIS — L97819 Non-pressure chronic ulcer of other part of right lower leg with unspecified severity: Secondary | ICD-10-CM | POA: Insufficient documentation

## 2015-01-19 DIAGNOSIS — L97902 Non-pressure chronic ulcer of unspecified part of unspecified lower leg with fat layer exposed: Secondary | ICD-10-CM

## 2015-01-19 DIAGNOSIS — Z590 Homelessness: Secondary | ICD-10-CM | POA: Insufficient documentation

## 2015-01-19 HISTORY — DX: Other psychoactive substance abuse, uncomplicated: F19.10

## 2015-01-19 HISTORY — DX: Altered mental status, unspecified: R41.82

## 2015-01-19 HISTORY — DX: Disorder of teeth and supporting structures, unspecified: K08.9

## 2015-01-19 HISTORY — DX: Methicillin susceptible Staphylococcus aureus infection as the cause of diseases classified elsewhere: R78.81

## 2015-01-19 HISTORY — DX: Muscle weakness (generalized): M62.81

## 2015-01-19 HISTORY — DX: Sepsis due to other specified Staphylococcus: A41.1

## 2015-01-19 HISTORY — DX: Disorder of kidney and ureter, unspecified: N28.9

## 2015-01-19 HISTORY — DX: Pyogenic arthritis, unspecified: M00.9

## 2015-01-19 HISTORY — DX: Anemia, unspecified: D64.9

## 2015-01-19 HISTORY — DX: Reserved for concepts with insufficient information to code with codable children: IMO0002

## 2015-01-19 HISTORY — DX: Encephalopathy, unspecified: G93.40

## 2015-01-19 HISTORY — DX: Fracture of one rib, left side, initial encounter for closed fracture: S22.32XA

## 2015-01-19 HISTORY — DX: Other bacterial infections of unspecified site: A49.8

## 2015-01-19 HISTORY — PX: I&D EXTREMITY: SHX5045

## 2015-01-19 LAB — CBC
HEMATOCRIT: 32.2 % — AB (ref 39.0–52.0)
Hemoglobin: 9.8 g/dL — ABNORMAL LOW (ref 13.0–17.0)
MCH: 24.1 pg — AB (ref 26.0–34.0)
MCHC: 30.4 g/dL (ref 30.0–36.0)
MCV: 79.1 fL (ref 78.0–100.0)
Platelets: 363 10*3/uL (ref 150–400)
RBC: 4.07 MIL/uL — ABNORMAL LOW (ref 4.22–5.81)
RDW: 16.5 % — AB (ref 11.5–15.5)
WBC: 5.6 10*3/uL (ref 4.0–10.5)

## 2015-01-19 LAB — COMPREHENSIVE METABOLIC PANEL
ALBUMIN: 2.9 g/dL — AB (ref 3.5–5.0)
ALT: 7 U/L — AB (ref 17–63)
AST: 15 U/L (ref 15–41)
Alkaline Phosphatase: 60 U/L (ref 38–126)
Anion gap: 10 (ref 5–15)
BUN: 11 mg/dL (ref 6–20)
CHLORIDE: 102 mmol/L (ref 101–111)
CO2: 28 mmol/L (ref 22–32)
Calcium: 9 mg/dL (ref 8.9–10.3)
Creatinine, Ser: 0.77 mg/dL (ref 0.61–1.24)
GFR calc Af Amer: >60 mL/min (ref >60)
GFR calc non Af Amer: >60 mL/min (ref >60)
GLUCOSE: 95 mg/dL (ref 65–99)
POTASSIUM: 3.9 mmol/L (ref 3.5–5.1)
Sodium: 140 mmol/L (ref 135–145)
Total Bilirubin: 0.4 mg/dL (ref 0.3–1.2)
Total Protein: 6.4 g/dL — ABNORMAL LOW (ref 6.5–8.1)

## 2015-01-19 SURGERY — IRRIGATION AND DEBRIDEMENT EXTREMITY
Anesthesia: General | Laterality: Bilateral

## 2015-01-19 MED ORDER — MIDAZOLAM HCL 5 MG/5ML IJ SOLN
INTRAMUSCULAR | Status: DC | PRN
  Administered 2015-01-19: 2 mg via INTRAVENOUS

## 2015-01-19 MED ORDER — ONDANSETRON HCL 4 MG/2ML IJ SOLN
INTRAMUSCULAR | Status: DC | PRN
Start: 2015-01-19 — End: 2015-01-19
  Administered 2015-01-19: 4 mg via INTRAVENOUS

## 2015-01-19 MED ORDER — SODIUM CHLORIDE 0.9 % IR SOLN
Status: DC | PRN
  Administered 2015-01-19: 1000 mL

## 2015-01-19 MED ORDER — LACTATED RINGERS IV SOLN
INTRAVENOUS | Status: DC
  Administered 2015-01-19 (×2): via INTRAVENOUS

## 2015-01-19 MED ORDER — POLYMYXIN B SULFATE 500000 UNITS IJ SOLR
INTRAMUSCULAR | Status: DC | PRN
  Administered 2015-01-19: 500 mL

## 2015-01-19 MED ORDER — MIDAZOLAM HCL 2 MG/2ML IJ SOLN
INTRAMUSCULAR | Status: AC
  Filled 2015-01-19: qty 4

## 2015-01-19 MED ORDER — LIDOCAINE HCL (CARDIAC) 20 MG/ML IV SOLN
INTRAVENOUS | Status: AC
  Filled 2015-01-19: qty 5

## 2015-01-19 MED ORDER — PROPOFOL 10 MG/ML IV BOLUS
INTRAVENOUS | Status: AC
  Filled 2015-01-19: qty 20

## 2015-01-19 MED ORDER — DEXAMETHASONE SODIUM PHOSPHATE 4 MG/ML IJ SOLN
INTRAMUSCULAR | Status: AC
  Filled 2015-01-19: qty 2

## 2015-01-19 MED ORDER — FENTANYL CITRATE (PF) 250 MCG/5ML IJ SOLN
INTRAMUSCULAR | Status: AC
  Filled 2015-01-19: qty 5

## 2015-01-19 MED ORDER — HYDROMORPHONE HCL 1 MG/ML IJ SOLN: 0.2500 mg | INTRAMUSCULAR | Status: DC | PRN

## 2015-01-19 MED ORDER — PROMETHAZINE HCL 25 MG/ML IJ SOLN: 6.2500 mg | INTRAMUSCULAR | Status: DC | PRN

## 2015-01-19 MED ORDER — FENTANYL CITRATE (PF) 100 MCG/2ML IJ SOLN
INTRAMUSCULAR | Status: DC | PRN
  Administered 2015-01-19 (×2): 25 ug via INTRAVENOUS

## 2015-01-19 MED ORDER — PHENYLEPHRINE HCL 10 MG/ML IJ SOLN
10.0000 mg | INTRAVENOUS | Status: DC | PRN
  Administered 2015-01-19: 25 ug/min via INTRAVENOUS

## 2015-01-19 MED ORDER — ONDANSETRON HCL 4 MG/2ML IJ SOLN
INTRAMUSCULAR | Status: AC
  Filled 2015-01-19: qty 2

## 2015-01-19 SURGICAL SUPPLY — 44 items
BANDAGE ELASTIC 4 VELCRO ST LF (GAUZE/BANDAGES/DRESSINGS) IMPLANT
BLADE SURG ROTATE 9660 (MISCELLANEOUS) ×3 IMPLANT
BNDG GAUZE ELAST 4 BULKY (GAUZE/BANDAGES/DRESSINGS) IMPLANT
BUR ROUND FLUTED 4 SOFT TCH (BURR) ×2 IMPLANT
BUR ROUND FLUTED 4MM SOFT TCH (BURR) ×1
CANISTER SUCTION 2500CC (MISCELLANEOUS) ×3 IMPLANT
CHLORAPREP W/TINT 26ML (MISCELLANEOUS) IMPLANT
CONNECTOR Y ATS VAC SYSTEM (MISCELLANEOUS) ×3 IMPLANT
CONT SPEC 4OZ CLIKSEAL STRL BL (MISCELLANEOUS) ×3 IMPLANT
COVER SURGICAL LIGHT HANDLE (MISCELLANEOUS) ×3 IMPLANT
DRAPE EXTREMITY T 121X128X90 (DRAPE) IMPLANT
DRAPE INCISE IOBAN 66X45 STRL (DRAPES) ×3 IMPLANT
DRAPE ORTHO SPLIT 77X108 STRL (DRAPES)
DRAPE SURG ORHT 6 SPLT 77X108 (DRAPES) IMPLANT
DRSG ADAPTIC 3X8 NADH LF (GAUZE/BANDAGES/DRESSINGS) ×3 IMPLANT
DRSG PAD ABDOMINAL 8X10 ST (GAUZE/BANDAGES/DRESSINGS) IMPLANT
DRSG VAC ATS LRG SENSATRAC (GAUZE/BANDAGES/DRESSINGS) IMPLANT
DRSG VAC ATS MED SENSATRAC (GAUZE/BANDAGES/DRESSINGS) IMPLANT
DRSG VAC ATS SM SENSATRAC (GAUZE/BANDAGES/DRESSINGS) ×3 IMPLANT
ELECT REM PT RETURN 9FT ADLT (ELECTROSURGICAL) ×3
ELECTRODE REM PT RTRN 9FT ADLT (ELECTROSURGICAL) ×1 IMPLANT
GAUZE SPONGE 4X4 12PLY STRL (GAUZE/BANDAGES/DRESSINGS) IMPLANT
GLOVE BIO SURGEON STRL SZ 6.5 (GLOVE) ×4 IMPLANT
GLOVE BIO SURGEONS STRL SZ 6.5 (GLOVE) ×2
GOWN STRL REUS W/ TWL LRG LVL3 (GOWN DISPOSABLE) ×2 IMPLANT
GOWN STRL REUS W/TWL LRG LVL3 (GOWN DISPOSABLE) ×4
HANDPIECE INTERPULSE COAX TIP (DISPOSABLE)
KIT BASIN OR (CUSTOM PROCEDURE TRAY) ×3 IMPLANT
KIT ROOM TURNOVER OR (KITS) ×3 IMPLANT
MATRIX SURGICAL PSM 7X10CM (Tissue) ×3 IMPLANT
MICROMATRIX 1000MG (Tissue) ×3 IMPLANT
NS IRRIG 1000ML POUR BTL (IV SOLUTION) ×3 IMPLANT
PACK GENERAL/GYN (CUSTOM PROCEDURE TRAY) ×3 IMPLANT
PAD ABD 8X10 STRL (GAUZE/BANDAGES/DRESSINGS) IMPLANT
PAD ARMBOARD 7.5X6 YLW CONV (MISCELLANEOUS) ×6 IMPLANT
PAD NEG PRESSURE SENSATRAC (MISCELLANEOUS) IMPLANT
SET HNDPC FAN SPRY TIP SCT (DISPOSABLE) IMPLANT
SOLUTION PARTIC MCRMTRX 1000MG (Tissue) ×1 IMPLANT
SUT SILK 4 0 P 3 (SUTURE) ×3 IMPLANT
SUT VIC AB 5-0 PS2 18 (SUTURE) ×6 IMPLANT
TOWEL OR 17X24 6PK STRL BLUE (TOWEL DISPOSABLE) IMPLANT
TOWEL OR 17X26 10 PK STRL BLUE (TOWEL DISPOSABLE) ×3 IMPLANT
UNDERPAD 30X30 INCONTINENT (UNDERPADS AND DIAPERS) ×3 IMPLANT
WATER STERILE IRR 1000ML POUR (IV SOLUTION) IMPLANT

## 2015-01-19 NOTE — Transfer of Care (Signed)
Immediate Anesthesia Transfer of Care Note  Patient: Timothy Norman  Procedure(s) Performed: Procedure(s): IRRIGATION AND DEBRIDEMENT BILATERAL KNEES  WITH PLACEMENT A CELL on right knee and bilateral knee wound VACs.  (Bilateral)  Patient Location: PACU  Anesthesia Type:General  Level of Consciousness: awake, alert , oriented and patient cooperative  Airway & Oxygen Therapy: Patient Spontanous Breathing and Patient connected to nasal cannula oxygen  Post-op Assessment: Report given to RN and Post -op Vital signs reviewed and stable  Post vital signs: Reviewed and stable  Last Vitals:  Filed Vitals:   01/19/15 1040  BP: 106/70  Pulse: 79  Temp: 36.4 C  Resp: 18    Complications: No apparent anesthesia complications

## 2015-01-19 NOTE — Op Note (Signed)
Operative Note   DATE OF OPERATION: 01/19/2015  LOCATION: [facility]   SURGICAL DIVISION: Plastic Surgery  PREOPERATIVE DIAGNOSES:  Bilateral knee wounds (Left 7 x 15 cm) (Right 7 x 9 cm)  POSTOPERATIVE DIAGNOSES:  same  PROCEDURE:  Preparation of right knee wounds for placement of Acell (powder 1 gm and sheet 7 x 10 cm), debridement of left knee tendon (no acell placed).  SURGEON: Wayland Denis, DO  ASSISTANT: Shawn Rayburn, PA  ANESTHESIA:  General.   COMPLICATIONS: None.   INDICATIONS FOR PROCEDURE:  The patient, Timothy Norman is a 64 y.o. male born on 1951-04-07, is here for treatment of bilateral wound on the knees after a fall two months ago. MRN: [id number]  CONSENT:  Informed consent was obtained directly from the patient. Risks, benefits and alternatives were fully discussed. Specific risks including but not limited to bleeding, infection, hematoma, seroma, scarring, pain, infection, contracture, asymmetry, wound healing problems, and need for further surgery were all discussed. The patient did have an ample opportunity to have questions answered to satisfaction.   DESCRIPTION OF PROCEDURE:  The patient was taken to the operating room. SCDs were placed and IV antibiotics were given. The patient's operative site was prepped and draped in a sterile fashion. A time out was performed and all information was confirmed to be correct.  General anesthesia was administered.  The wounds were irrigated with antibiotic solution.  The #10 blade was used to debride the tendon on both knees (Left 7 x 15 cm) (Right 7 x 9 cm) .  The burr was used to debride the right knee cap to punctate bleeding.  The Acell powder (1 gm) and sheet (7 x 10 cm) was placed on the right knee area and secured in place with 5-0 Vicryl to the 7 x 9 cm wound.  Adaptic was placed over both knees with KY gel.  The VAC was secured and a pressure of 125 mmHg was obtained.  The patient tolerated the  procedure well.  There were no complications. The patient was allowed to wake from anesthesia, extubated and taken to the recovery room in satisfactory condition.

## 2015-01-19 NOTE — Anesthesia Postprocedure Evaluation (Signed)
Anesthesia Post Note  Patient: Timothy Norman  Procedure(s) Performed: Procedure(s) (LRB): IRRIGATION AND DEBRIDEMENT BILATERAL KNEES  WITH PLACEMENT A CELL on right knee and bilateral knee wound VACs.  (Bilateral)  Anesthesia type: general  Patient location: PACU  Post pain: Pain level controlled  Post assessment: Patient's Cardiovascular Status Stable  Last Vitals:  Filed Vitals:   01/19/15 1521  BP: 121/75  Pulse: 77  Temp:   Resp: 16    Post vital signs: Reviewed and stable  Level of consciousness: sedated  Complications: No apparent anesthesia complications

## 2015-01-19 NOTE — Discharge Instructions (Addendum)
Do not change vac on right knee. [facility] in 1-[facility] weeks.  May change the [facility] but leave the adaptic on the right knee in 1 week. Dr. Roda Shutters planning on operating on the left knee on Friday.

## 2015-01-19 NOTE — Anesthesia Preprocedure Evaluation (Addendum)
Anesthesia Evaluation  Patient identified by MRN, date of birth, ID band Patient awake    Reviewed: Allergy & Precautions, NPO status , Patient's Chart, lab work & pertinent test results  Airway Mallampati: II  TM Distance: >3 FB Neck ROM: Full    Dental  (+) Poor Dentition, Dental Advisory Given, Loose,    Pulmonary Current Smoker, former smoker,    breath sounds clear to auscultation       Cardiovascular negative cardio ROS   Rhythm:Regular Rate:Normal     Neuro/Psych negative neurological ROS  negative psych ROS   GI/Hepatic negative GI ROS, (+)     substance abuse  alcohol use,   Endo/Other  negative endocrine ROS  Renal/GU Renal hypertensionRenal disease     Musculoskeletal   Abdominal   Peds  Hematology   Anesthesia Other Findings Tooth is very loose pt is able to wiggle with his finger. Explained that this tooth could fall out. Pt understands and is not concerned with loosing this tooth.tb  Reproductive/Obstetrics                           Anesthesia Physical  Anesthesia Plan  ASA: III  Anesthesia Plan: General   Post-op Pain Management:    Induction: Intravenous  Airway Management Planned: LMA  Additional Equipment:   Intra-op Plan:   Post-operative Plan: Extubation in OR  Informed Consent: I have reviewed the patients History and Physical, chart, labs and discussed the procedure including the risks, benefits and alternatives for the proposed anesthesia with the patient or authorized representative who has indicated his/her understanding and acceptance.   Dental advisory given  Plan Discussed with: CRNA, Anesthesiologist and Surgeon  Anesthesia Plan Comments:         Anesthesia Quick Evaluation                                  Anesthesia Evaluation   Patient awake    Reviewed: Allergy & Precautions, NPO status , Patient's Chart, lab work & pertinent  test results  Airway Mallampati: II  TM Distance: >3 FB Neck ROM: Full    Dental  (+) Poor Dentition, Dental Advisory Given   Pulmonary Current Smoker,  breath sounds clear to auscultation        Cardiovascular Rhythm:Regular Rate:Normal     Neuro/Psych    GI/Hepatic   Endo/Other    Renal/GU      Musculoskeletal   Abdominal   Peds  Hematology   Anesthesia Other Findings   Reproductive/Obstetrics                            Anesthesia Physical Anesthesia Plan  ASA: III  Anesthesia Plan: General   Post-op Pain Management:    Induction: Intravenous  Airway Management Planned: Oral ETT  Additional Equipment:   Intra-op Plan:   Post-operative Plan: Extubation in OR  Informed Consent: I have reviewed the patients History and Physical, chart, labs and discussed the procedure including the risks, benefits and alternatives for the proposed anesthesia with the patient or authorized representative who has indicated his/her understanding and acceptance.   Dental advisory given  Plan Discussed with: CRNA, Anesthesiologist and Surgeon  Anesthesia Plan Comments:         Anesthesia Quick Evaluation

## 2015-01-19 NOTE — H&P (Signed)
Timothy Norman is an 64 y.o. male.   Chief Complaint: bilateral leg wounds HPI:  The patient is a 64 yrs old bm here for treatment of his bilateral leg wounds.  He has an extensive history of alcohol abuse and homelessness.  He presented to the ED in July with bilateral leg wounds from a fall.  He underwent debridement and partial closure at the time by ortho.  We were consulted and the patient was discharge to a nursing facility for rehab. He has been treated with a VAC at the facility.  On exam at the wound center two days ago he had exposed patella bone.  Ortho was notified.   Past Medical History  Diagnosis Date  . ETOH abuse   . Muscle weakness   . Sepsis due to other specified Staphylococcus (HCC)   . Anemia   . Encephalopathy     from hospital discharge summary 11/05/14  . Acute renal insufficiency     from hospital discharge summary 11/05/14.  . Polysubstance abuse     from hospital discharge summary 11/05/14.  . Septic arthritis of knee, left (HCC)     from hospital discharge summary 11/05/14.  Marland Kitchen Left rib fracture     from hospital discharge summary 11/05/14.  Marland Kitchen Staphylococcus aureus bacteremia     from hospital discharge summary 11/05/14.  Marland Kitchen Altered mental status     from hospital discharge summary 11/05/14.  . Wound abscess     from hospital discharge summary 11/05/14.  . Pseudomonas infection     from hospital discharge summary 11/05/14.  Marland Kitchen Poor dentition     from hospital discharge summary 11/05/14.  . Septic arthritis of knee, right (HCC)     from hospital discharge summary 11/05/14.    Past Surgical History  Procedure Laterality Date  . Infected skin debridement Bilateral 10/26/2014  . I&d extremity Bilateral 10/26/2014    Procedure: IRRIGATION AND DEBRIDEMENT BILATERAL LEGS;  Surgeon: Timothy Kos, MD;  Location: [facility] OR;  Service: Orthopedics;  Laterality: Bilateral;  . I&d extremity Bilateral 10/29/2014    Procedure: IRRIGATION AND DEBRIDEMENT BILATERAL KNEES WITH VAC  CHANGE;  Surgeon: Timothy Kos, MD;  Location: [facility] OR;  Service: Orthopedics;  Laterality: Bilateral;  . Tee without cardioversion N/A 11/03/2014    Procedure: TRANSESOPHAGEAL ECHOCARDIOGRAM (TEE);  Surgeon: Timothy Stade, MD;  Location: [facility] ENDOSCOPY;  Service: Cardiovascular;  Laterality: N/A;    History reviewed. No pertinent family history. Social History:  reports that he quit smoking about 3 months ago. His smoking use included Cigarettes. He smoked 0.25 packs per day. He has never used smokeless tobacco. He reports that he drinks about 1.2 oz of alcohol per week. He reports that he does not use illicit drugs.  Allergies: No Known Allergies  No prescriptions prior to admission    No results found for this or any previous visit (from the past 48 hour(s)). No results found.  Review of Systems  Constitutional: Negative.   HENT: Negative.   Eyes: Negative.   Respiratory: Negative.   Cardiovascular: Negative.   Gastrointestinal: Negative.   Genitourinary: Negative.   Musculoskeletal: Positive for joint pain.  Skin: Negative.   Psychiatric/Behavioral: The patient is nervous/anxious.     There were no vitals taken for this visit. Physical Exam  Constitutional: He appears well-developed.  HENT:  Head: Normocephalic.  Eyes: Conjunctivae and EOM are normal. Pupils are equal, round, and reactive to light.  Respiratory: Effort normal.  GI: Soft.  Musculoskeletal:  Legs: Neurological: He is alert.  Psychiatric: He has a normal mood and affect.     Assessment/Plan Plan for debridement and placement of Acell and the VAC.  Timothy Norman 01/19/2015, 7:45 AM

## 2015-01-19 NOTE — Brief Op Note (Signed)
01/19/2015  2:20 PM  PATIENT:  Timothy Norman  64 y.o. male  PRE-OPERATIVE DIAGNOSIS:  LOWER EXTREMITY WOUND BILATERAL  POST-OPERATIVE DIAGNOSIS:  Lower extremity wound bilateal.  PROCEDURE:  Procedure(s): IRRIGATION AND DEBRIDEMENT BILATERAL KNEES  WITH PLACEMENT A CELL on right knee and bilateral knee wound VACs.  (Bilateral)  SURGEON:  Surgeon(s) and Role:    * Claire S Dillingham, DO - Primary  PHYSICIAN ASSISTANT: none  ASSISTANTS: none   ANESTHESIA:   general  EBL:     BLOOD ADMINISTERED:none  DRAINS: none   LOCAL MEDICATIONS USED:  NONE  SPECIMEN:  No Specimen  DISPOSITION OF SPECIMEN:  N/A  COUNTS:  YES  TOURNIQUET:  * No tourniquets in log *  DICTATION: .Dragon Dictation  PLAN OF CARE: Discharge to home after PACU  PATIENT DISPOSITION:  PACU - hemodynamically stable.   Delay start of Pharmacological VTE agent (>24hrs) due to surgical blood loss or risk of bleeding: no

## 2015-01-20 ENCOUNTER — Encounter (HOSPITAL_COMMUNITY): Payer: Self-pay | Admitting: *Deleted

## 2015-01-20 ENCOUNTER — Other Ambulatory Visit (HOSPITAL_COMMUNITY): Payer: Self-pay | Admitting: Orthopaedic Surgery

## 2015-01-20 NOTE — Pre-Procedure Instructions (Signed)
    Rodriques Badie Ringel  01/20/2015      [facility] [id number] [address], [place] - [address] AT [facility] [address] [place] [address] Phone: [phone] Fax: [phone]    Your procedure is scheduled on Friday, January 21, 2015  Report to [facility] Admitting at 6:30 A.M.  Call this number if you have problems the morning of surgery:  [phone]   Remember:  Do not eat food or drink liquids after midnight.  Take these medicines the morning of surgery with A SIP OF WATER: pantoprazole (PROTONIX), if needed: oxyCODONE (OXY IR/ROXICODONE)  for pain   Do not wear jewelry, make-up or nail polish.  Do not wear lotions, powders, or perfumes.  You may wear deodorant.  Do not shave 48 hours prior to surgery.  Men may shave face and neck.  Do not bring valuables to the hospital.  [facility] is not responsible for any belongings or valuables.  Contacts, dentures or bridgework may not be worn into surgery.  Leave your suitcase in the car.  After surgery it may be brought to your room.  Patients discharged the day of surgery will not be allowed to drive home.

## 2015-01-20 NOTE — Progress Notes (Signed)
Pt SDW-pre-op call completed by pt nurse Emmit Alexanders. Nurse called to confirm receipt of pre-op instructions faxed.

## 2015-01-21 ENCOUNTER — Ambulatory Visit (HOSPITAL_COMMUNITY)
Admission: RE | Admit: 2015-01-21 | Discharge: 2015-01-21 | Disposition: A | Discharge status: Skilled Nursing Facility | Payer: Medicare Other | Source: Ambulatory Visit | Attending: Orthopaedic Surgery | Admitting: Orthopaedic Surgery

## 2015-01-21 ENCOUNTER — Ambulatory Visit (HOSPITAL_COMMUNITY): Payer: Medicare Other | Admitting: Anesthesiology

## 2015-01-21 ENCOUNTER — Encounter (HOSPITAL_COMMUNITY)
Admission: RE | Disposition: A | Discharge status: Skilled Nursing Facility | Payer: Self-pay | Source: Ambulatory Visit | Attending: Orthopaedic Surgery

## 2015-01-21 ENCOUNTER — Encounter (HOSPITAL_COMMUNITY): Payer: Self-pay | Admitting: Certified Registered Nurse Anesthetist

## 2015-01-21 DIAGNOSIS — S82002G Unspecified fracture of left patella, subsequent encounter for closed fracture with delayed healing: Secondary | ICD-10-CM | POA: Insufficient documentation

## 2015-01-21 DIAGNOSIS — M868X6 Other osteomyelitis, lower leg: Secondary | ICD-10-CM | POA: Insufficient documentation

## 2015-01-21 DIAGNOSIS — X58XXXD Exposure to other specified factors, subsequent encounter: Secondary | ICD-10-CM | POA: Diagnosis not present

## 2015-01-21 HISTORY — PX: APPLICATION OF WOUND VAC: SHX5189

## 2015-01-21 HISTORY — PX: PATELLECTOMY: SHX1022

## 2015-01-21 LAB — CBC
HEMATOCRIT: 32.9 % — AB (ref 39.0–52.0)
HEMOGLOBIN: 10.2 g/dL — AB (ref 13.0–17.0)
MCH: 24.6 pg — AB (ref 26.0–34.0)
MCHC: 31 g/dL (ref 30.0–36.0)
MCV: 79.5 fL (ref 78.0–100.0)
Platelets: 393 10*3/uL (ref 150–400)
RBC: 4.14 MIL/uL — AB (ref 4.22–5.81)
RDW: 16.6 % — ABNORMAL HIGH (ref 11.5–15.5)
WBC: 6 10*3/uL (ref 4.0–10.5)

## 2015-01-21 LAB — GLUCOSE, CAPILLARY: Glucose-Capillary: 104 mg/dL — ABNORMAL HIGH (ref 65–99)

## 2015-01-21 SURGERY — PATELLECTOMY
Anesthesia: General | Site: Knee | Laterality: Left

## 2015-01-21 MED ORDER — PHENYLEPHRINE HCL 10 MG/ML IJ SOLN
INTRAMUSCULAR | Status: DC | PRN
  Administered 2015-01-21 (×4): 80 ug via INTRAVENOUS

## 2015-01-21 MED ORDER — ONDANSETRON HCL 4 MG/2ML IJ SOLN
INTRAMUSCULAR | Status: DC | PRN
  Administered 2015-01-21: 4 mg via INTRAVENOUS

## 2015-01-21 MED ORDER — HYDROMORPHONE HCL 1 MG/ML IJ SOLN
0.2500 mg | INTRAMUSCULAR | Status: DC | PRN
  Administered 2015-01-21 (×4): 0.5 mg via INTRAVENOUS

## 2015-01-21 MED ORDER — PROPOFOL 10 MG/ML IV BOLUS
INTRAVENOUS | Status: AC
  Filled 2015-01-21: qty 20

## 2015-01-21 MED ORDER — MIDAZOLAM HCL 2 MG/2ML IJ SOLN
INTRAMUSCULAR | Status: AC
  Filled 2015-01-21: qty 4

## 2015-01-21 MED ORDER — SODIUM CHLORIDE 0.9 % IR SOLN
Status: DC | PRN
  Administered 2015-01-21: 3000 mL

## 2015-01-21 MED ORDER — LACTATED RINGERS IV SOLN
INTRAVENOUS | Status: DC
  Administered 2015-01-21 (×3): via INTRAVENOUS

## 2015-01-21 MED ORDER — 0.9 % SODIUM CHLORIDE (POUR BTL) OPTIME
TOPICAL | Status: DC | PRN
  Administered 2015-01-21: 1000 mL

## 2015-01-21 MED ORDER — HYDROMORPHONE HCL 1 MG/ML IJ SOLN
INTRAMUSCULAR | Status: DC
Start: 2015-01-21 — End: 2015-01-21
  Filled 2015-01-21: qty 2

## 2015-01-21 MED ORDER — FENTANYL CITRATE (PF) 100 MCG/2ML IJ SOLN
INTRAMUSCULAR | Status: DC | PRN
  Administered 2015-01-21: 100 ug via INTRAVENOUS
  Administered 2015-01-21 (×3): 50 ug via INTRAVENOUS

## 2015-01-21 MED ORDER — PROMETHAZINE HCL 25 MG/ML IJ SOLN: 6.2500 mg | INTRAMUSCULAR | Status: DC | PRN

## 2015-01-21 MED ORDER — FENTANYL CITRATE (PF) 250 MCG/5ML IJ SOLN
INTRAMUSCULAR | Status: AC
  Filled 2015-01-21: qty 5

## 2015-01-21 MED ORDER — ONDANSETRON HCL 4 MG/2ML IJ SOLN
INTRAMUSCULAR | Status: AC
  Filled 2015-01-21: qty 2

## 2015-01-21 MED ORDER — PROPOFOL 10 MG/ML IV BOLUS
INTRAVENOUS | Status: DC | PRN
  Administered 2015-01-21 (×2): 50 mg via INTRAVENOUS
  Administered 2015-01-21: 100 mg via INTRAVENOUS

## 2015-01-21 MED ORDER — MIDAZOLAM HCL 5 MG/5ML IJ SOLN
INTRAMUSCULAR | Status: DC | PRN
  Administered 2015-01-21: 2 mg via INTRAVENOUS

## 2015-01-21 MED ORDER — SUCCINYLCHOLINE CHLORIDE 20 MG/ML IJ SOLN
INTRAMUSCULAR | Status: DC | PRN
  Administered 2015-01-21: 100 mg via INTRAVENOUS

## 2015-01-21 MED ORDER — CEFAZOLIN SODIUM-DEXTROSE 2-3 GM-% IV SOLR
2.0000 g | INTRAVENOUS | Status: AC
  Administered 2015-01-21: 2 g via INTRAVENOUS
  Filled 2015-01-21: qty 50

## 2015-01-21 SURGICAL SUPPLY — 54 items
BANDAGE ELASTIC 4 VELCRO ST LF (GAUZE/BANDAGES/DRESSINGS) IMPLANT
BANDAGE ELASTIC 6 VELCRO ST LF (GAUZE/BANDAGES/DRESSINGS) IMPLANT
BNDG COHESIVE 4X5 TAN STRL (GAUZE/BANDAGES/DRESSINGS) IMPLANT
BNDG COHESIVE 6X5 TAN STRL LF (GAUZE/BANDAGES/DRESSINGS) IMPLANT
CANISTER WOUND CARE 500ML ATS (WOUND CARE) ×3 IMPLANT
COVER SURGICAL LIGHT HANDLE (MISCELLANEOUS) ×3 IMPLANT
CUFF TOURNIQUET SINGLE 34IN LL (TOURNIQUET CUFF) ×3 IMPLANT
CUFF TOURNIQUET SINGLE 44IN (TOURNIQUET CUFF) IMPLANT
DRAPE C-ARM 42X72 X-RAY (DRAPES) IMPLANT
DRAPE C-ARMOR (DRAPES) IMPLANT
DRAPE IMP U-DRAPE 54X76 (DRAPES) ×6 IMPLANT
DRAPE INCISE IOBAN 66X45 STRL (DRAPES) ×3 IMPLANT
DRAPE U-SHAPE 47X51 STRL (DRAPES) IMPLANT
DRSG VAC ATS MED SENSATRAC (GAUZE/BANDAGES/DRESSINGS) ×3 IMPLANT
DURAPREP 26ML APPLICATOR (WOUND CARE) ×3 IMPLANT
ELECT CAUTERY BLADE 6.4 (BLADE) IMPLANT
ELECT REM PT RETURN 9FT ADLT (ELECTROSURGICAL) ×3
ELECTRODE REM PT RTRN 9FT ADLT (ELECTROSURGICAL) ×1 IMPLANT
FACESHIELD WRAPAROUND (MASK) IMPLANT
GAUZE SPONGE 4X4 12PLY STRL (GAUZE/BANDAGES/DRESSINGS) ×3 IMPLANT
GAUZE XEROFORM 5X9 LF (GAUZE/BANDAGES/DRESSINGS) IMPLANT
GLOVE NEODERM STRL 7.5 LF PF (GLOVE) ×2 IMPLANT
GLOVE SURG NEODERM 7.5  LF PF (GLOVE) ×4
GOWN STRL REIN XL XLG (GOWN DISPOSABLE) ×9 IMPLANT
KIT BASIN OR (CUSTOM PROCEDURE TRAY) ×3 IMPLANT
KIT ROOM TURNOVER OR (KITS) ×3 IMPLANT
NEEDLE HYPO 25GX1X1/2 BEV (NEEDLE) IMPLANT
NS IRRIG 1000ML POUR BTL (IV SOLUTION) ×3 IMPLANT
PACK ORTHO EXTREMITY (CUSTOM PROCEDURE TRAY) ×3 IMPLANT
PACK UNIVERSAL I (CUSTOM PROCEDURE TRAY) ×3 IMPLANT
PAD ARMBOARD 7.5X6 YLW CONV (MISCELLANEOUS) ×6 IMPLANT
PAD CAST 3X4 CTTN HI CHSV (CAST SUPPLIES) IMPLANT
PADDING CAST COTTON 3X4 STRL (CAST SUPPLIES)
PADDING CAST COTTON 6X4 STRL (CAST SUPPLIES) IMPLANT
PASSER SUT SWANSON 36MM LOOP (INSTRUMENTS) IMPLANT
SPONGE LAP 18X18 X RAY DECT (DISPOSABLE) ×3 IMPLANT
STOCKINETTE IMPERVIOUS LG (DRAPES) ×3 IMPLANT
SUCTION FRAZIER TIP 10 FR DISP (SUCTIONS) IMPLANT
SUT ETHIBOND 5 LR DA (SUTURE) ×3 IMPLANT
SUT ETHILON 2 0 FS 18 (SUTURE) ×3 IMPLANT
SUT ETHILON 2 0 PSLX (SUTURE) ×3 IMPLANT
SUT ETHILON 3 0 PS 1 (SUTURE) IMPLANT
SUT FIBERWIRE #2 38 T-5 BLUE (SUTURE)
SUT VIC AB 0 CT1 27 (SUTURE)
SUT VIC AB 0 CT1 27XBRD ANBCTR (SUTURE) IMPLANT
SUT VIC AB 2-0 CT1 27 (SUTURE)
SUT VIC AB 2-0 CT1 TAPERPNT 27 (SUTURE) IMPLANT
SUTURE FIBERWR #2 38 T-5 BLUE (SUTURE) IMPLANT
SYR CONTROL 10ML LL (SYRINGE) IMPLANT
TOWEL OR 17X24 6PK STRL BLUE (TOWEL DISPOSABLE) ×3 IMPLANT
TOWEL OR 17X26 10 PK STRL BLUE (TOWEL DISPOSABLE) ×6 IMPLANT
TUBE CONNECTING 12'X1/4 (SUCTIONS) ×1
TUBE CONNECTING 12X1/4 (SUCTIONS) ×2 IMPLANT
WATER STERILE IRR 1000ML POUR (IV SOLUTION) IMPLANT

## 2015-01-21 NOTE — Anesthesia Preprocedure Evaluation (Addendum)
Anesthesia Evaluation  Patient identified by MRN, date of birth, ID band Patient awake    Reviewed: Allergy & Precautions, NPO status , Patient's Chart, lab work & pertinent test results  Airway Mallampati: II  TM Distance: >3 FB Neck ROM: Full    Dental  (+) Poor Dentition, Dental Advisory Given, Loose   Pulmonary Current Smoker,    breath sounds clear to auscultation       Cardiovascular negative cardio ROS   Rhythm:Regular Rate:Normal  7/16 ECHO: EF 60-65%, mild LVH, diastolic dysfunction, mild MR   Neuro/Psych Alcoholic encephalopathy negative psych ROS   GI/Hepatic negative GI ROS, (+)     substance abuse  alcohol use,   Endo/Other  negative endocrine ROS  Renal/GU negative Renal ROS     Musculoskeletal   Abdominal   Peds  Hematology  (+) anemia ,   Anesthesia Other Findings Tooth is very loose pt is able to wiggle with his finger. Explained that this tooth could fall out. Pt understands and is not concerned with loosing this tooth.tb  Reproductive/Obstetrics                           Anesthesia Physical Anesthesia Plan  ASA: III  Anesthesia Plan: General   Post-op Pain Management:    Induction: Intravenous  Airway Management Planned: Oral ETT  Additional Equipment:   Intra-op Plan:   Post-operative Plan: Extubation in OR  Informed Consent: I have reviewed the patients History and Physical, chart, labs and discussed the procedure including the risks, benefits and alternatives for the proposed anesthesia with the patient or authorized representative who has indicated his/her understanding and acceptance.   Dental advisory given  Plan Discussed with: CRNA and Surgeon  Anesthesia Plan Comments: (Plan routine monitors, GETA)       Anesthesia Quick Evaluation

## 2015-01-21 NOTE — Anesthesia Procedure Notes (Signed)
Procedure Name: Intubation Date/Time: 01/21/2015 8:49 AM Performed by: Patrcia Dolly, Shallon Yaklin Pre-anesthesia Checklist: Patient identified, Patient being monitored, Timeout performed, Emergency Drugs available and Suction available Patient Re-evaluated:Patient Re-evaluated prior to inductionOxygen Delivery Method: Circle System Utilized Preoxygenation: Pre-oxygenation with 100% oxygen Intubation Type: IV induction Ventilation: Mask ventilation without difficulty Laryngoscope Size: Miller and 2 Grade View: Grade I Tube type: Oral Tube size: 7.0 mm Number of attempts: 1 Airway Equipment and Method: Stylet Placement Confirmation: ETT inserted through vocal cords under direct vision,  positive ETCO2 and breath sounds checked- equal and bilateral Secured at: 21 cm Tube secured with: Tape Dental Injury: Teeth and Oropharynx as per pre-operative assessment

## 2015-01-21 NOTE — Transfer of Care (Signed)
Immediate Anesthesia Transfer of Care Note  Patient: Timothy Norman  Procedure(s) Performed: Procedure(s) with comments: PATELLECTOMY (Left) - irrigation and debridement left anterior knee with patellectomy  APPLICATION OF WOUND VAC LEFT ANTERIOR KNEE (Left)  Patient Location: PACU  Anesthesia Type:General  Level of Consciousness: sedated, pateint uncooperative and responds to stimulation  Airway & Oxygen Therapy: Patient Spontanous Breathing and Patient connected to face mask oxygen  Post-op Assessment: Report given to RN, Post -op Vital signs reviewed and stable and Patient moving all extremities X 4  Post vital signs: Reviewed and stable  Last Vitals:  Filed Vitals:   01/21/15 0730  BP: 130/85  Pulse: 76  Temp: 36.4 C  Resp: 16    Complications: No apparent anesthesia complications

## 2015-01-21 NOTE — Transfer of Care (Signed)
Immediate Anesthesia Transfer of Care Note  Patient: Timothy Norman  Procedure(s) Performed: Procedure(s) with comments: PATELLECTOMY (Left) - irrigation and debridement left anterior knee with patellectomy  APPLICATION OF WOUND VAC LEFT ANTERIOR KNEE (Left)  Patient Location: PACU  Anesthesia Type:General  Level of Consciousness: sedated, patient cooperative and responds to stimulation  Airway & Oxygen Therapy: Patient Spontanous Breathing and Patient connected to face mask oxygen  Post-op Assessment: Report given to RN, Post -op Vital signs reviewed and stable and Patient moving all extremities X 4  Post vital signs: Reviewed and stable  Last Vitals:  Filed Vitals:   01/21/15 0730  BP: 130/85  Pulse: 76  Temp: 36.4 C  Resp: 16    Complications: No apparent anesthesia complications

## 2015-01-21 NOTE — Anesthesia Postprocedure Evaluation (Signed)
  Anesthesia Post-op Note  Patient: Timothy Norman  Procedure(s) Performed: Procedure(s) with comments: PATELLECTOMY (Left) - irrigation and debridement left anterior knee with patellectomy  APPLICATION OF WOUND VAC LEFT ANTERIOR KNEE (Left)  Patient Location: PACU  Anesthesia Type:General  Level of Consciousness: awake, alert  and patient cooperative, mental status as pre-op  Airway and Oxygen Therapy: Patient Spontanous Breathing and Patient connected to nasal cannula oxygen  Post-op Pain: mild  Post-op Assessment: Post-op Vital signs reviewed, Patient's Cardiovascular Status Stable, Respiratory Function Stable, Patent Airway, No signs of Nausea or vomiting and Pain level controlled LLE Motor Response: Purposeful movement, Responds to commands LLE Sensation: No numbness RLE Motor Response: Purposeful movement, Responds to commands RLE Sensation: No numbness      Post-op Vital Signs: Reviewed and stable  Last Vitals:  Filed Vitals:   01/21/15 1204  BP:   Pulse: 84  Temp:   Resp: 13    Complications: No apparent anesthesia complications

## 2015-01-21 NOTE — H&P (Signed)
PREOPERATIVE H&P  Chief Complaint: LEFT PATELLA FRACTURE  HPI: Timothy Norman is a 64 y.o. male who presents for surgical treatment of LEFT PATELLA FRACTURE.  He denies any changes in medical history.  Past Medical History  Diagnosis Date  . ETOH abuse   . Muscle weakness   . Sepsis due to other specified Staphylococcus (HCC)   . Anemia   . Encephalopathy     from hospital discharge summary 11/05/14  . Polysubstance abuse     from hospital discharge summary 11/05/14.  . Septic arthritis of knee, left (HCC)     from hospital discharge summary 11/05/14.  Marland Kitchen Left rib fracture     from hospital discharge summary 11/05/14.  Marland Kitchen Staphylococcus aureus bacteremia     from hospital discharge summary 11/05/14.  Marland Kitchen Altered mental status     from hospital discharge summary 11/05/14.  . Wound abscess     from hospital discharge summary 11/05/14.  . Pseudomonas infection     from hospital discharge summary 11/05/14.  Marland Kitchen Poor dentition     from hospital discharge summary 11/05/14.  . Septic arthritis of knee, right (HCC)     from hospital discharge summary 11/05/14.  Marland Kitchen Acute renal insufficiency     from hospital discharge summary 11/05/14.   Past Surgical History  Procedure Laterality Date  . Infected skin debridement Bilateral 10/26/2014  . I&d extremity Bilateral 10/26/2014    Procedure: IRRIGATION AND DEBRIDEMENT BILATERAL LEGS;  Surgeon: Tarry Kos, MD;  Location: [facility] OR;  Service: Orthopedics;  Laterality: Bilateral;  . I&d extremity Bilateral 10/29/2014    Procedure: IRRIGATION AND DEBRIDEMENT BILATERAL KNEES WITH VAC CHANGE;  Surgeon: Tarry Kos, MD;  Location: [facility] OR;  Service: Orthopedics;  Laterality: Bilateral;  . Tee without cardioversion N/A 11/03/2014    Procedure: TRANSESOPHAGEAL ECHOCARDIOGRAM (TEE);  Surgeon: Wendall Stade, MD;  Location: [facility] ENDOSCOPY;  Service: Cardiovascular;  Laterality: N/A;  . I&d extremity Bilateral 01/19/2015    Procedure: IRRIGATION AND DEBRIDEMENT  BILATERAL KNEES  WITH PLACEMENT A CELL on right knee and bilateral knee wound VACs. ;  Surgeon: Alena Bills Dillingham, DO;  Location: [facility] OR;  Service: Plastics;  Laterality: Bilateral;   Social History   Social History  . Marital Status: Legally Separated    Spouse Name: N/A  . Number of Children: N/A  . Years of Education: N/A   Social History Main Topics  . Smoking status: Former Smoker -- 0.25 packs/day    Types: Cigarettes    Quit date: 10/18/2014  . Smokeless tobacco: Never Used  . Alcohol Use: No  . Drug Use: No  . Sexual Activity: Yes   Other Topics Concern  . None   Social History Narrative   History reviewed. No pertinent family history. No Known Allergies Prior to Admission medications   Medication Sig Start Date End Date Taking? Authorizing Provider  Amino Acids-Protein Hydrolys (FEEDING SUPPLEMENT, PRO-STAT SUGAR FREE 64,) LIQD Take 30 mLs by mouth 2 (two) times daily with a meal. Patient taking differently: Take 30 mLs by mouth 2 (two) times daily with a meal. 10am and 30m 11/05/14   Beaulah Dinning, MD  diphenhydrAMINE (BENADRYL) 25 mg capsule Take 1 capsule (25 mg total) by mouth every 6 (six) hours as needed for itching. 11/05/14   Beaulah Dinning, MD  divalproex (DEPAKOTE SPRINKLE) 125 MG capsule Take 250 mg by mouth 2 (two) times daily. 10am and 10pm - to stabilize mood  Historical Provider, MD  ferrous sulfate 325 (65 FE) MG tablet Take 325 mg by mouth 2 (two) times daily. 10am and 10pm    Historical Provider, MD  folic acid (FOLVITE) 1 MG tablet Take 1 tablet (1 mg total) by mouth daily. 11/05/14   Beaulah Dinning, MD  lactose free nutrition (BOOST PLUS) LIQD Take by mouth 3 (three) times daily with meals. 9am, 11am, 6pm    Historical Provider, MD  Multiple Vitamins-Minerals (CERTAVITE/ANTIOXIDANTS) TABS Take 1 tablet by mouth daily. For wound healing    Historical Provider, MD  oxyCODONE (OXY IR/ROXICODONE) 5 MG immediate release tablet Take 1 tablet  (5 mg total) by mouth every 6 (six) hours. Patient taking differently: Take 5 mg by mouth See admin instructions. Take 1 tablet (5 mg) by mouth every 6 hours at 12am, 6am, 12pm, 6pm, also take 1 tablet (5 mg) by mouth 30 minutes prior to dressing change on Monday, Wednesday and Friday 11/05/14   Beaulah Dinning, MD  pantoprazole (PROTONIX) 40 MG tablet Take 40 mg by mouth daily before breakfast.    Historical Provider, MD  polyethylene glycol (MIRALAX / GLYCOLAX) packet Take 17 g by mouth daily as needed for mild constipation. Patient taking differently: Take 17 g by mouth daily as needed for mild constipation. Mix in 4-8 oz of liquid and drink 11/05/14   Beaulah Dinning, MD  thiamine 100 MG tablet Take 1 tablet (100 mg total) by mouth daily. 11/05/14   Beaulah Dinning, MD  vitamin C (ASCORBIC ACID) 500 MG tablet Take 500 mg by mouth 2 (two) times daily. 10am and 6pm for wound healing    Historical Provider, MD     Positive ROS: All other systems have been reviewed and were otherwise negative with the exception of those mentioned in the HPI and as above.  Physical Exam: General: Alert, no acute distress Cardiovascular: No pedal edema Respiratory: No cyanosis, no use of accessory musculature GI: abdomen soft Skin: No lesions in the area of chief complaint Neurologic: Sensation intact distally Psychiatric: Patient is competent for consent with normal mood and affect Lymphatic: no lymphedema  MUSCULOSKELETAL: exam stable  Assessment: LEFT PATELLA FRACTURE  Plan: Plan for Procedure(s): PATELLECTOMY APPLICATION OF WOUND VAC  The risks benefits and alternatives were discussed with the patient including but not limited to the risks of nonoperative treatment, versus surgical intervention including infection, bleeding, nerve injury,  blood clots, cardiopulmonary complications, morbidity, mortality, among others, and they were willing to proceed.   Cheral Almas,  MD   01/21/2015 7:08 AM

## 2015-01-21 NOTE — Op Note (Addendum)
   Date of surgery: 01/21/2015  Preoperative diagnosis: left knee wound 7 x 15 cm with necrotic patella  Postoperative diagnosis: Same  Procedure: 1. Irrigation and debridement of left knee wound of tendon, subcutaneous tissue, skin 7 x 15 cm 2. Left knee total patellectomy 3. Application of wound VAC greater than 50 cm 4. Secondary closure of surgical wound left leg and knee  Surgeon: Glee Arvin, M.D.  Anesthesia: Gen.  Estimated blood loss: 25 mL  Complications: None  Condition to PACU: Stable  Indications for procedure: The patient returns today for treatment of his left knee wound and necrotic patella. He had previously undergone multiple debridements with Dr. Kelly Splinter of plastic surgery.  His large wound with the necrotic patella was felt to be nonviable for wound coverage and acell placement.  The decision was made to perform a total patellectomy allow for secondary wound closure.  The patient was aware of the risks benefits alternatives to surgery he wished proceed. Consent was obtained.  Description of procedure: The patient was identified in the preoperative holding area. Operative site was marked by the surgeon confirmed with the patient. He is brought back to the operating room. His placed supine on table. Nonsterile tourniquet was placed on the left upper thigh. Operative extremity was prepped and draped in standard sterile fashion. Timeout was performed. Preoperative antibiotics were given.  A longitudinal incision over the quadriceps tendon and the patellar tendon was created. Full-thickness flaps were created. The tendons were identified and elevated off of the scar tissue. If total patellectomy was then performed.  Once this was done we attempted to bring the patellar and quadriceps tendon together. However given the extensive scar tissue this was not possible. Therefore he was left with an open knee joint. We then performed sharp debridement of the wound using a knife and  rongeur. This was debrided back to healthy bleeding tissue. We then thoroughly irrigated the wound with pulse lavage. I performed partial secondary closure of the knee and leg wounds that were created from the previous surgery in order to decrease the size of the wound.  Adaptic was placed on the wounds and a wound VAC was placed and set to -125 mmHg. Patient tolerated the procedure well was extubated and transferred to the PACU in stable condition.  Postoperative plan: The patient will be immobilized in a Bledsoe brace in full extension. He will return back to the operating room with Dr. Kelly Splinter for secondary coverage of the wound.  [name], MD [facility] [phone] 9:52 AM

## 2015-01-24 ENCOUNTER — Encounter (HOSPITAL_COMMUNITY): Payer: Self-pay | Admitting: Orthopaedic Surgery

## 2015-01-31 DIAGNOSIS — S81001A Unspecified open wound, right knee, initial encounter: Secondary | ICD-10-CM | POA: Diagnosis not present

## 2015-01-31 DIAGNOSIS — M1990 Unspecified osteoarthritis, unspecified site: Secondary | ICD-10-CM | POA: Diagnosis not present

## 2015-01-31 DIAGNOSIS — S81002A Unspecified open wound, left knee, initial encounter: Secondary | ICD-10-CM | POA: Diagnosis not present

## 2015-02-14 DIAGNOSIS — S81001A Unspecified open wound, right knee, initial encounter: Secondary | ICD-10-CM | POA: Diagnosis not present

## 2015-02-14 DIAGNOSIS — M1990 Unspecified osteoarthritis, unspecified site: Secondary | ICD-10-CM | POA: Diagnosis not present

## 2015-02-14 DIAGNOSIS — S81002A Unspecified open wound, left knee, initial encounter: Secondary | ICD-10-CM | POA: Diagnosis not present

## 2015-03-03 ENCOUNTER — Other Ambulatory Visit: Payer: Self-pay | Admitting: Plastic Surgery

## 2015-03-03 DIAGNOSIS — L97924 Non-pressure chronic ulcer of unspecified part of left lower leg with necrosis of bone: Principal | ICD-10-CM

## 2015-03-03 DIAGNOSIS — L97914 Non-pressure chronic ulcer of unspecified part of right lower leg with necrosis of bone: Secondary | ICD-10-CM

## 2015-03-04 ENCOUNTER — Encounter (HOSPITAL_COMMUNITY): Payer: Self-pay | Admitting: *Deleted

## 2015-03-04 NOTE — Progress Notes (Signed)
Pt SDW-pre-op call completed by pt nurse Emmit AlexandersJuanita York. Nurse called to confirm receipt of pre-op instructions faxed. Please complete sleep apnea screening on DOS.

## 2015-03-04 NOTE — Pre-Procedure Instructions (Signed)
    Timothy Norman  03/04/2015      [facility] [id number] [address]- [place], [place] - [phone] AT [facility] [address] [place] [id number] Phone: [phone] Fax: [phone]    Your procedure is scheduled on Monday, March 07, 2015  [facility] at 11:15 A.M.  Call this number if you have problems the morning of surgery:  [phone]   Remember:  Do not eat food or drink liquids after midnight Sunday, March 06, 2015  Take these medicines the morning of surgery with A SIP OF WATER : Depakote, oxycodone, protonix Stop all otc vitamins and herbal medications now   Do not wear jewelry, make-up or nail polish.  Do not wear lotions, powders, or perfumes.  You may not wear deodorant.  Do not shave 48 hours prior to surgery.  Men may shave face and neck.  Do not bring valuables to the hospital.  [facility] is not responsible for any belongings or valuables.  Contacts, dentures or bridgework may not be worn into surgery.  Leave your suitcase in the car.  After surgery it may be brought to your room.  For patients admitted to the hospital, discharge time will be determined by your treatment team.  Patients discharged the day of surgery will not be allowed to drive home.   Name and phone number of your driver:    Special instructions:   Please read over the following fact sheets that you were given.

## 2015-03-07 ENCOUNTER — Encounter (HOSPITAL_BASED_OUTPATIENT_CLINIC_OR_DEPARTMENT_OTHER): Payer: Medicare Other | Attending: Plastic Surgery

## 2015-03-07 ENCOUNTER — Encounter (HOSPITAL_COMMUNITY)
Admission: RE | Disposition: A | Discharge status: Home / Self Care | Payer: Self-pay | Source: Ambulatory Visit | Attending: Plastic Surgery

## 2015-03-07 ENCOUNTER — Ambulatory Visit (HOSPITAL_COMMUNITY): Payer: Medicare Other | Admitting: Certified Registered Nurse Anesthetist

## 2015-03-07 ENCOUNTER — Encounter (HOSPITAL_COMMUNITY): Payer: Self-pay | Admitting: *Deleted

## 2015-03-07 ENCOUNTER — Ambulatory Visit (HOSPITAL_COMMUNITY)
Admission: RE | Admit: 2015-03-07 | Discharge: 2015-03-07 | Disposition: A | Discharge status: Home / Self Care | Payer: Medicare Other | Source: Ambulatory Visit | Attending: Plastic Surgery | Admitting: Plastic Surgery

## 2015-03-07 DIAGNOSIS — Z87891 Personal history of nicotine dependence: Secondary | ICD-10-CM | POA: Diagnosis not present

## 2015-03-07 DIAGNOSIS — L97819 Non-pressure chronic ulcer of other part of right lower leg with unspecified severity: Secondary | ICD-10-CM | POA: Insufficient documentation

## 2015-03-07 DIAGNOSIS — N189 Chronic kidney disease, unspecified: Secondary | ICD-10-CM | POA: Insufficient documentation

## 2015-03-07 DIAGNOSIS — K219 Gastro-esophageal reflux disease without esophagitis: Secondary | ICD-10-CM | POA: Diagnosis not present

## 2015-03-07 DIAGNOSIS — L97829 Non-pressure chronic ulcer of other part of left lower leg with unspecified severity: Secondary | ICD-10-CM | POA: Diagnosis not present

## 2015-03-07 DIAGNOSIS — M1990 Unspecified osteoarthritis, unspecified site: Secondary | ICD-10-CM | POA: Insufficient documentation

## 2015-03-07 DIAGNOSIS — L97914 Non-pressure chronic ulcer of unspecified part of right lower leg with necrosis of bone: Secondary | ICD-10-CM

## 2015-03-07 DIAGNOSIS — Z79899 Other long term (current) drug therapy: Secondary | ICD-10-CM | POA: Insufficient documentation

## 2015-03-07 DIAGNOSIS — L97924 Non-pressure chronic ulcer of unspecified part of left lower leg with necrosis of bone: Secondary | ICD-10-CM

## 2015-03-07 HISTORY — DX: Non-pressure chronic ulcer of unspecified part of left lower leg with unspecified severity: L97.929

## 2015-03-07 HISTORY — PX: APPLICATION OF A-CELL OF EXTREMITY: SHX6303

## 2015-03-07 HISTORY — DX: Non-pressure chronic ulcer of unspecified part of left lower leg with unspecified severity: L97.919

## 2015-03-07 HISTORY — PX: I&D EXTREMITY: SHX5045

## 2015-03-07 LAB — COMPREHENSIVE METABOLIC PANEL
ALBUMIN: 2.8 g/dL — AB (ref 3.5–5.0)
ALK PHOS: 63 U/L (ref 38–126)
ALT: 10 U/L — ABNORMAL LOW (ref 17–63)
AST: 19 U/L (ref 15–41)
Anion gap: 11 (ref 5–15)
BILIRUBIN TOTAL: 0.1 mg/dL — AB (ref 0.3–1.2)
BUN: 12 mg/dL (ref 6–20)
CALCIUM: 9.4 mg/dL (ref 8.9–10.3)
CO2: 27 mmol/L (ref 22–32)
CREATININE: 0.77 mg/dL (ref 0.61–1.24)
Chloride: 101 mmol/L (ref 101–111)
GFR calc Af Amer: >60 mL/min (ref >60)
GFR calc non Af Amer: >60 mL/min (ref >60)
GLUCOSE: 112 mg/dL — AB (ref 65–99)
Potassium: 4.7 mmol/L (ref 3.5–5.1)
Sodium: 139 mmol/L (ref 135–145)
TOTAL PROTEIN: 6.5 g/dL (ref 6.5–8.1)

## 2015-03-07 LAB — CBC
HEMATOCRIT: 36.5 % — AB (ref 39.0–52.0)
HEMOGLOBIN: 11.4 g/dL — AB (ref 13.0–17.0)
MCH: 24 pg — ABNORMAL LOW (ref 26.0–34.0)
MCHC: 31.2 g/dL (ref 30.0–36.0)
MCV: 76.8 fL — ABNORMAL LOW (ref 78.0–100.0)
Platelets: 351 10*3/uL (ref 150–400)
RBC: 4.75 MIL/uL (ref 4.22–5.81)
RDW: 17.4 % — ABNORMAL HIGH (ref 11.5–15.5)
WBC: 6.9 10*3/uL (ref 4.0–10.5)

## 2015-03-07 SURGERY — IRRIGATION AND DEBRIDEMENT EXTREMITY
Anesthesia: General | Site: Knee | Laterality: Bilateral

## 2015-03-07 MED ORDER — SUCCINYLCHOLINE CHLORIDE 20 MG/ML IJ SOLN
INTRAMUSCULAR | Status: DC | PRN
  Administered 2015-03-07: 120 mg via INTRAVENOUS

## 2015-03-07 MED ORDER — CEFAZOLIN SODIUM-DEXTROSE 2-3 GM-% IV SOLR
2.0000 g | INTRAVENOUS | Status: AC
  Administered 2015-03-07: 2 g via INTRAVENOUS
  Filled 2015-03-07: qty 50

## 2015-03-07 MED ORDER — SODIUM CHLORIDE 0.9 % IR SOLN
Status: DC | PRN
  Administered 2015-03-07: 1000 mL

## 2015-03-07 MED ORDER — PROPOFOL 10 MG/ML IV BOLUS
INTRAVENOUS | Status: AC
  Filled 2015-03-07: qty 20

## 2015-03-07 MED ORDER — HYDROMORPHONE HCL 1 MG/ML IJ SOLN: 0.2500 mg | INTRAMUSCULAR | Status: DC | PRN

## 2015-03-07 MED ORDER — PHENYLEPHRINE HCL 10 MG/ML IJ SOLN
INTRAMUSCULAR | Status: DC | PRN
  Administered 2015-03-07 (×8): 80 ug via INTRAVENOUS

## 2015-03-07 MED ORDER — SODIUM CHLORIDE 0.9 % IR SOLN
Status: DC | PRN
  Administered 2015-03-07: 500 mL

## 2015-03-07 MED ORDER — MEPERIDINE HCL 25 MG/ML IJ SOLN: 6.2500 mg | INTRAMUSCULAR | Status: DC | PRN

## 2015-03-07 MED ORDER — MIDAZOLAM HCL 5 MG/5ML IJ SOLN
INTRAMUSCULAR | Status: DC | PRN
  Administered 2015-03-07: 2 mg via INTRAVENOUS

## 2015-03-07 MED ORDER — FENTANYL CITRATE (PF) 250 MCG/5ML IJ SOLN
INTRAMUSCULAR | Status: AC
  Filled 2015-03-07: qty 10

## 2015-03-07 MED ORDER — FENTANYL CITRATE (PF) 100 MCG/2ML IJ SOLN
INTRAMUSCULAR | Status: DC | PRN
  Administered 2015-03-07 (×5): 50 ug via INTRAVENOUS

## 2015-03-07 MED ORDER — MIDAZOLAM HCL 2 MG/2ML IJ SOLN
INTRAMUSCULAR | Status: AC
  Filled 2015-03-07: qty 4

## 2015-03-07 MED ORDER — LACTATED RINGERS IV SOLN
INTRAVENOUS | Status: DC
  Administered 2015-03-07 (×2): via INTRAVENOUS

## 2015-03-07 MED ORDER — ALBUMIN HUMAN 5 % IV SOLN
INTRAVENOUS | Status: DC | PRN
  Administered 2015-03-07: 16:00:00 via INTRAVENOUS

## 2015-03-07 MED ORDER — PROPOFOL 10 MG/ML IV BOLUS
INTRAVENOUS | Status: DC | PRN
  Administered 2015-03-07: 140 mg via INTRAVENOUS

## 2015-03-07 MED ORDER — PROMETHAZINE HCL 25 MG/ML IJ SOLN: 6.2500 mg | INTRAMUSCULAR | Status: DC | PRN

## 2015-03-07 MED ORDER — LACTATED RINGERS IV SOLN: INTRAVENOUS | Status: DC

## 2015-03-07 MED ORDER — LIDOCAINE HCL (CARDIAC) 20 MG/ML IV SOLN
INTRAVENOUS | Status: DC | PRN
Start: 2015-03-07 — End: 2015-03-07
  Administered 2015-03-07: 80 mg via INTRAVENOUS

## 2015-03-07 SURGICAL SUPPLY — 53 items
BANDAGE ELASTIC 4 VELCRO ST LF (GAUZE/BANDAGES/DRESSINGS) IMPLANT
BLADE SURG ROTATE 9660 (MISCELLANEOUS) IMPLANT
BNDG GAUZE ELAST 4 BULKY (GAUZE/BANDAGES/DRESSINGS) IMPLANT
CANISTER SUCTION 2500CC (MISCELLANEOUS) ×4 IMPLANT
CHLORAPREP W/TINT 26ML (MISCELLANEOUS) IMPLANT
CONNECTOR Y ATS VAC SYSTEM (MISCELLANEOUS) ×4 IMPLANT
COVER SURGICAL LIGHT HANDLE (MISCELLANEOUS) ×4 IMPLANT
DRAPE EXTREMITY T 121X128X90 (DRAPE) IMPLANT
DRAPE INCISE IOBAN 66X45 STRL (DRAPES) ×4 IMPLANT
DRAPE ORTHO SPLIT 77X108 STRL (DRAPES) ×4
DRAPE SURG ORHT 6 SPLT 77X108 (DRAPES) ×4 IMPLANT
DRSG ADAPTIC 3X8 NADH LF (GAUZE/BANDAGES/DRESSINGS) ×4 IMPLANT
DRSG PAD ABDOMINAL 8X10 ST (GAUZE/BANDAGES/DRESSINGS) IMPLANT
DRSG VAC ATS LRG SENSATRAC (GAUZE/BANDAGES/DRESSINGS) IMPLANT
DRSG VAC ATS MED SENSATRAC (GAUZE/BANDAGES/DRESSINGS) ×4 IMPLANT
DRSG VAC ATS SM SENSATRAC (GAUZE/BANDAGES/DRESSINGS) IMPLANT
ELECT CAUTERY BLADE 6.4 (BLADE) ×4 IMPLANT
ELECT REM PT RETURN 9FT ADLT (ELECTROSURGICAL) ×4
ELECTRODE REM PT RTRN 9FT ADLT (ELECTROSURGICAL) ×2 IMPLANT
GAUZE SPONGE 4X4 12PLY STRL (GAUZE/BANDAGES/DRESSINGS) IMPLANT
GLOVE BIO SURGEON STRL SZ 6.5 (GLOVE) ×12 IMPLANT
GLOVE BIO SURGEONS STRL SZ 6.5 (GLOVE) ×4
GLOVE SKINSENSE NS SZ7.0 (GLOVE) ×2
GLOVE SKINSENSE STRL SZ7.0 (GLOVE) ×2 IMPLANT
GOWN STRL REUS W/ TWL LRG LVL3 (GOWN DISPOSABLE) ×6 IMPLANT
GOWN STRL REUS W/TWL LRG LVL3 (GOWN DISPOSABLE) ×6
HANDPIECE INTERPULSE COAX TIP (DISPOSABLE) ×2
IMMOBILIZER KNEE 20 (SOFTGOODS) ×4
IMMOBILIZER KNEE 20 THIGH 36 (SOFTGOODS) ×2 IMPLANT
KIT BASIN OR (CUSTOM PROCEDURE TRAY) ×4 IMPLANT
KIT ROOM TURNOVER OR (KITS) ×4 IMPLANT
MATRIX SURGICAL PSM 5X5CM (Tissue) ×4 IMPLANT
MATRIX SURGICAL PSM 7X10CM (Tissue) ×4 IMPLANT
MICROMATRIX 1000MG (Tissue) ×4 IMPLANT
NS IRRIG 1000ML POUR BTL (IV SOLUTION) ×4 IMPLANT
PACK GENERAL/GYN (CUSTOM PROCEDURE TRAY) ×4 IMPLANT
PACK ORTHO EXTREMITY (CUSTOM PROCEDURE TRAY) IMPLANT
PAD ABD 8X10 STRL (GAUZE/BANDAGES/DRESSINGS) IMPLANT
PAD ARMBOARD 7.5X6 YLW CONV (MISCELLANEOUS) ×8 IMPLANT
PAD NEG PRESSURE SENSATRAC (MISCELLANEOUS) ×4 IMPLANT
SET HNDPC FAN SPRY TIP SCT (DISPOSABLE) ×2 IMPLANT
SOLUTION PARTIC MCRMTRX 1000MG (Tissue) ×2 IMPLANT
STOCKINETTE IMPERVIOUS 9X36 MD (GAUZE/BANDAGES/DRESSINGS) IMPLANT
STOCKINETTE IMPERVIOUS LG (DRAPES) IMPLANT
SUT SILK 4 0 P 3 (SUTURE) ×4 IMPLANT
SUT VIC AB 5-0 PS2 18 (SUTURE) ×12 IMPLANT
TOWEL OR 17X24 6PK STRL BLUE (TOWEL DISPOSABLE) ×4 IMPLANT
TOWEL OR 17X26 10 PK STRL BLUE (TOWEL DISPOSABLE) ×4 IMPLANT
TUBE CONNECTING 12'X1/4 (SUCTIONS) ×1
TUBE CONNECTING 12X1/4 (SUCTIONS) ×3 IMPLANT
UNDERPAD 30X30 INCONTINENT (UNDERPADS AND DIAPERS) ×4 IMPLANT
WATER STERILE IRR 1000ML POUR (IV SOLUTION) IMPLANT
YANKAUER SUCT BULB TIP NO VENT (SUCTIONS) ×4 IMPLANT

## 2015-03-07 NOTE — Anesthesia Procedure Notes (Signed)
Procedure Name: Intubation Date/Time: 03/07/2015 3:51 PM Performed by: Dairl PonderJIANG, Ekin Pilar Pre-anesthesia Checklist: Patient identified, Timeout performed, Emergency Drugs available, Suction available and Patient being monitored Patient Re-evaluated:Patient Re-evaluated prior to inductionOxygen Delivery Method: Circle system utilized Preoxygenation: Pre-oxygenation with 100% oxygen Intubation Type: IV induction Ventilation: Mask ventilation without difficulty Laryngoscope Size: Mac and 3 Grade View: Grade I Tube type: Oral Tube size: 7.0 mm Number of attempts: 1 Airway Equipment and Method: Stylet Placement Confirmation: ETT inserted through vocal cords under direct vision,  breath sounds checked- equal and bilateral and positive ETCO2 Secured at: 23 cm Tube secured with: Tape Dental Injury: Teeth and Oropharynx as per pre-operative assessment

## 2015-03-07 NOTE — Progress Notes (Signed)
Pt drank boost and pro stat this am at 0900 Dr Randa EvensEdwards called and informed states to let Dr Ulice Boldillingham know.

## 2015-03-07 NOTE — Anesthesia Preprocedure Evaluation (Addendum)
Anesthesia Evaluation  Patient identified by MRN, date of birth, ID band Patient awake    Reviewed: Allergy & Precautions, NPO status , Patient's Chart, lab work & pertinent test results  Airway Mallampati: II  TM Distance: >3 FB Neck ROM: Full    Dental  (+) Teeth Intact   Pulmonary former smoker,    breath sounds clear to auscultation       Cardiovascular negative cardio ROS   Rhythm:Regular Rate:Normal     Neuro/Psych negative neurological ROS  negative psych ROS   GI/Hepatic Neg liver ROS, GERD  Medicated,  Endo/Other  negative endocrine ROS  Renal/GU CRFRenal disease  negative genitourinary   Musculoskeletal  (+) Arthritis ,   Abdominal   Peds negative pediatric ROS (+)  Hematology negative hematology ROS (+)   Anesthesia Other Findings   Reproductive/Obstetrics negative OB ROS                            Lab Results  Component Value Date   WBC 6.9 03/07/2015   HGB 11.4* 03/07/2015   HCT 36.5* 03/07/2015   MCV 76.8* 03/07/2015   PLT 351 03/07/2015   Lab Results  Component Value Date   CREATININE 0.77 03/07/2015   BUN 12 03/07/2015   NA 139 03/07/2015   K 4.7 03/07/2015   CL 101 03/07/2015   CO2 27 03/07/2015   Lab Results  Component Value Date   INR 1.16 10/26/2014   EKG: normal sinus rhythm.   Anesthesia Physical Anesthesia Plan  ASA: III  Anesthesia Plan: General   Post-op Pain Management:    Induction: Intravenous  Airway Management Planned: Oral ETT  Additional Equipment:   Intra-op Plan:   Post-operative Plan: Extubation in OR  Informed Consent: I have reviewed the patients History and Physical, chart, labs and discussed the procedure including the risks, benefits and alternatives for the proposed anesthesia with the patient or authorized representative who has indicated his/her understanding and acceptance.   Dental advisory given  Plan  Discussed with: CRNA  Anesthesia Plan Comments:         Anesthesia Quick Evaluation

## 2015-03-07 NOTE — Progress Notes (Signed)
Dr Lawrence Santiagoillingham  Called and informed of pt taking boost and pro stat. This am at 0900

## 2015-03-07 NOTE — Brief Op Note (Signed)
03/07/2015  4:42 PM  PATIENT:  Timothy Norman  64 y.o. male  PRE-OPERATIVE DIAGNOSIS:  BILaTERAL KNEE ULCERS  POST-OPERATIVE DIAGNOSIS:  bilateral knee ulcers  PROCEDURE:  Procedure(s): IRRIGATION AND DEBRIDEMENT OF BILATARAL KNEES (Bilateral) APPLICATION OF A-CELL and wound vac exchange to bilateral knees (Bilateral)  SURGEON:  Surgeon(s) and Role:    * Claire S Dillingham, DO - Primary  PHYSICIAN ASSISTANT: Shawn Rayburn, PA  ASSISTANTS: none   ANESTHESIA:   general  EBL:  Total I/O In: 250 [IV Piggyback:250] Out: -   BLOOD ADMINISTERED:none  DRAINS: none   LOCAL MEDICATIONS USED:  NONE  SPECIMEN:  No Specimen  DISPOSITION OF SPECIMEN:  N/A  COUNTS:  YES  TOURNIQUET:  * No tourniquets in log *  DICTATION: .Dragon Dictation  PLAN OF CARE: Discharge to home after PACU  PATIENT DISPOSITION:  PACU - hemodynamically stable.   Delay start of Pharmacological VTE agent (>24hrs) due to surgical blood loss or risk of bleeding: no

## 2015-03-07 NOTE — Op Note (Signed)
Operative Note   DATE OF OPERATION: 03/07/2015  [facility]   SURGICAL DIVISION: Plastic Surgery  PREOPERATIVE DIAGNOSES:  Bilateral knee wounds (Left 6 x 14 cm) (Right 5 x 5 cm)  POSTOPERATIVE DIAGNOSES:  same  PROCEDURE:   1. Excisional debridement of right knee including bone for placement of Acell (sheet 5 x 5 cm and 500 mg powder). 2. Preparation of right knee wounds for placement of Acell (powder 500 mg and sheet 7 x 10 cm).  SURGEON: Wayland Denislaire Sanger, DO  ASSISTANT: Shawn Rayburn, PA  ANESTHESIA:  General.   COMPLICATIONS: None.   INDICATIONS FOR PROCEDURE:  The patient, Timothy Norman is a 64 y.o. male born on 12/18/1950, is here for treatment of bilateral wound on the knees after a fall two months ago. MRN: [id number]  CONSENT:  Informed consent was obtained directly from the patient. Risks, benefits and alternatives were fully discussed. Specific risks including but not limited to bleeding, infection, hematoma, seroma, scarring, pain, infection, contracture, asymmetry, wound healing problems, and need for further surgery were all discussed. The patient did have an ample opportunity to have questions answered to satisfaction.   DESCRIPTION OF PROCEDURE:  The patient was taken to the operating room and IV antibiotics were given. The patient's operative site was prepped and draped in a sterile fashion. A time out was performed and all information was confirmed to be correct.  General anesthesia was administered.  The wounds were irrigated with antibiotic solution.   Right knee: The #10 blade was used to debride the skin 5 x 5 cm.  The bone was debrided with a chisel and hammer. Hemostasis was achieved with electrocautery.  The Acell powder 500 mg and all of the 5 x 5 cm sheet was applied.  The sheet was secured with 5-0 Vicryl. Adaptic was placed.  The KY gel and VAC was applied. Left knee:  The wound was irrigated as described above.  Hemostasis was  achieved with electrocautery (6 x 14 cm).  The Acell powder (500 mg) and all of the sheet (7 x 10 cm) was placed and secured with 5-0 Vicryl.  Adaptic was placed and secured with 4-0 Silk.  The KY gel and VAC were applied and the pressure set at 125 mmHg on both.  The patient tolerated the procedure well.  There were no complications. The patient was allowed to wake from anesthesia, extubated and taken to the recovery room in satisfactory condition.

## 2015-03-07 NOTE — Transfer of Care (Signed)
Immediate Anesthesia Transfer of Care Note  Patient: Timothy Norman  Procedure(s) Performed: Procedure(s): IRRIGATION AND DEBRIDEMENT OF BILATARAL KNEES (Bilateral) APPLICATION OF A-CELL and wound vac exchange to bilateral knees (Bilateral)  Patient Location: PACU  Anesthesia Type:General  Level of Consciousness: awake, alert  and oriented  Airway & Oxygen Therapy: Patient Spontanous Breathing and Patient connected to nasal cannula oxygen  Post-op Assessment: Report given to RN and Post -op Vital signs reviewed and stable  Post vital signs: Reviewed and stable  Last Vitals:  Filed Vitals:   03/07/15 1235  BP: 135/71  Pulse: 92  Temp: 36.4 C  Resp: 18    Complications: No apparent anesthesia complications

## 2015-03-07 NOTE — Anesthesia Postprocedure Evaluation (Signed)
Anesthesia Post Note  Patient: Timothy Norman  Procedure(s) Performed: Procedure(s) (LRB): IRRIGATION AND DEBRIDEMENT OF BILATARAL KNEES (Bilateral) APPLICATION OF A-CELL and wound vac exchange to bilateral knees (Bilateral)  Patient location during evaluation: PACU Anesthesia Type: General Level of consciousness: awake and alert Pain management: pain level controlled Vital Signs Assessment: post-procedure vital signs reviewed and stable Respiratory status: spontaneous breathing, nonlabored ventilation, respiratory function stable and patient connected to nasal cannula oxygen Cardiovascular status: blood pressure returned to baseline and stable Postop Assessment: No signs of nausea or vomiting Anesthetic complications: no    Last Vitals:  Filed Vitals:   03/07/15 1726 03/07/15 1739  BP: 152/93 173/91  Pulse: 99 103  Temp:    Resp: 21 24    Last Pain:  Filed Vitals:   03/07/15 1742  PainSc: 9                  Shelton SilvasKevin D Zniya Cottone

## 2015-03-07 NOTE — Discharge Instructions (Signed)
Plan for Wound Center visit next Monday. Don't need to change vac if he is seen in the wound center Monday (November 28). Keep leg in knee brace.

## 2015-03-07 NOTE — Progress Notes (Signed)
Dr. Randa EvensEdwards called and informed that Dr Ulice Boldillingham is on her way over. Also informed the patient now states he ate breakfast this am  After 0700. He had sausage, pancake, Grits, Eggs, Milk, OJ. Dr Floy Sabinaillinham called and informed that Dr Randa EvensEdwards is ok to proceed with surgery, if pt has been npo 6 hours.

## 2015-03-07 NOTE — H&P (Signed)
Timothy Norman is an 64 y.o. male.   Chief Complaint: bilateral knee wounds HPI: The patient is a 64 yrs old bm here for treatment of his bilateral leg wounds.  He feel several months ago and sustained very large and deep wounds to bilateral knees.  He underwent debridement with ortho and plastic surgery was consulted.  He had acell placed by us and a patellectomy on the left by ortho.  He presents for further care of both knees.  He is currently in a nursing facility and being treated with the VAC.  Past Medical History  Diagnosis Date  . ETOH abuse   . Muscle weakness   . Sepsis due to other specified Staphylococcus (HCC)   . Anemia   . Encephalopathy     from hospital discharge summary 11/05/14  . Polysubstance abuse     from hospital discharge summary 11/05/14.  . Septic arthritis of knee, left (HCC)     from hospital discharge summary 11/05/14.  Marland Kitchen. Left rib fracture     from hospital discharge summary 11/05/14.  Marland Kitchen. Staphylococcus aureus bacteremia     from hospital discharge summary 11/05/14.  Marland Kitchen. Altered mental status     from hospital discharge summary 11/05/14.  . Wound abscess     from hospital discharge summary 11/05/14.  . Pseudomonas infection     from hospital discharge summary 11/05/14.  Marland Kitchen. Poor dentition     from hospital discharge summary 11/05/14.  . Septic arthritis of knee, right (HCC)     from hospital discharge summary 11/05/14.  Marland Kitchen. Acute renal insufficiency     from hospital discharge summary 11/05/14.  Marland Kitchen. Ulcers of both lower extremities [facility](HCC)     Past Surgical History  Procedure Laterality Date  . Infected skin debridement Bilateral 10/26/2014  . I&d extremity Bilateral 10/26/2014    Procedure: IRRIGATION AND DEBRIDEMENT BILATERAL LEGS;  Surgeon: Tarry KosNaiping M Xu, MD;  Location: [facility] OR;  Service: Orthopedics;  Laterality: Bilateral;  . I&d extremity Bilateral 10/29/2014    Procedure: IRRIGATION AND DEBRIDEMENT BILATERAL KNEES WITH VAC CHANGE;  Surgeon: Tarry KosNaiping M Xu, MD;   Location: [facility] OR;  Service: Orthopedics;  Laterality: Bilateral;  . Tee without cardioversion N/A 11/03/2014    Procedure: TRANSESOPHAGEAL ECHOCARDIOGRAM (TEE);  Surgeon: Wendall StadePeter C Nishan, MD;  [facility];  Service: Cardiovascular;  Laterality: N/A;  . I&d extremity Bilateral 01/19/2015    Procedure: IRRIGATION AND DEBRIDEMENT BILATERAL KNEES  WITH PLACEMENT A CELL on right knee and bilateral knee wound VACs. ;  Surgeon: Alena Billslaire S Dillingham, DO;  Location: [facility] OR;  Service: Plastics;  Laterality: Bilateral;  . Patellectomy Left 01/21/2015    Procedure: PATELLECTOMY;  Surgeon: Tarry KosNaiping M Xu, MD;  Location: [facility] OR;  Service: Orthopedics;  Laterality: Left;  irrigation and debridement left anterior knee with patellectomy   . Application of wound vac Left 01/21/2015    Procedure: APPLICATION OF WOUND VAC LEFT ANTERIOR KNEE;  Surgeon: Tarry KosNaiping M Xu, MD;  Location: [facility] OR;  Service: Orthopedics;  Laterality: Left;    History reviewed. No pertinent family history. Social History:  reports that he quit smoking about 4 months ago. His smoking use included Cigarettes. He smoked 0.25 packs per day. He has never used smokeless tobacco. He reports that he drinks about 1.2 oz of alcohol per week. He reports that he does not use illicit drugs.  Allergies: No Known Allergies  Medications Prior to Admission  Medication Sig Dispense Refill  . Amino Acids-Protein Hydrolys (FEEDING  SUPPLEMENT, PRO-STAT SUGAR FREE 64,) LIQD Take 30 mLs by mouth 2 (two) times daily with a meal. (Patient taking differently: Take 30 mLs by mouth 2 (two) times daily with a meal. 10am and 70m) 900 mL 0  . diphenhydrAMINE (BENADRYL) 25 mg capsule Take 1 capsule (25 mg total) by mouth every 6 (six) hours as needed for itching. 30 capsule 0  . divalproex (DEPAKOTE SPRINKLE) 125 MG capsule Take 375 mg by mouth 2 (two) times daily. 10am and 6pm    . ferrous sulfate 325 (65 FE) MG tablet Take 325 mg by mouth 2 (two) times daily. 10am and 10pm     . folic acid (FOLVITE) 1 MG tablet Take 1 tablet (1 mg total) by mouth daily. 30 tablet 5  . lactose free nutrition (BOOST PLUS) LIQD Take 120 mLs by mouth 3 (three) times daily with meals. 9am, 11am, 6pm    . Multiple Vitamins-Minerals (CERTAVITE/ANTIOXIDANTS) TABS Take 1 tablet by mouth daily. For wound healing    . oxyCODONE (OXY IR/ROXICODONE) 5 MG immediate release tablet Take 1 tablet (5 mg total) by mouth every 6 (six) hours. (Patient taking differently: Take 5 mg by mouth See admin instructions. Take 1 tablet (5 mg) by mouth every 6 hours at 12am, 6am, 12pm, 6pm, also take 1 tablet (5 mg) by mouth 30 minutes prior to dressing change on Monday, Wednesday and Friday) 20 tablet 0  . pantoprazole (PROTONIX) 40 MG tablet Take 40 mg by mouth daily before breakfast.    . polyethylene glycol (MIRALAX / GLYCOLAX) packet Take 17 g by mouth daily as needed for mild constipation. (Patient taking differently: Take 17 g by mouth daily as needed for mild constipation. Mix in 4-8 oz of liquid and drink) 14 each 0  . thiamine 100 MG tablet Take 1 tablet (100 mg total) by mouth daily. 30 tablet 5  . traZODone (DESYREL) 50 MG tablet Take 25 mg by mouth 3 (three) times daily.    . vitamin C (ASCORBIC ACID) 500 MG tablet Take 500 mg by mouth 2 (two) times daily. 10am and 6pm for wound healing      Results for orders placed or performed during the hospital encounter of 03/07/15 (from the past 48 hour(s))  CBC     Status: Abnormal   Collection Time: 03/07/15  1:26 PM  Result Value Ref Range   WBC 6.9 4.0 - 10.5 K/uL   RBC 4.75 4.22 - 5.81 MIL/uL   Hemoglobin 11.4 (L) 13.0 - 17.0 g/dL   HCT 96.0 (L) 45.4 - 09.8 %   MCV 76.8 (L) 78.0 - 100.0 fL   MCH 24.0 (L) 26.0 - 34.0 pg   MCHC 31.2 30.0 - 36.0 g/dL   RDW 11.9 (H) 14.7 - 82.9 %   Platelets 351 150 - 400 K/uL   No results found.  Review of Systems  Constitutional: Negative.   HENT: Negative.   Eyes: Negative.   Respiratory: Negative.     Cardiovascular: Negative.   Gastrointestinal: Negative.   Genitourinary: Negative.   Musculoskeletal: Negative.   Skin: Negative.   Psychiatric/Behavioral: Negative.     Blood pressure 135/71, pulse 92, temperature 97.6 F (36.4 C), temperature source Oral, resp. rate 18, height  (1.676 m), weight 63.322 kg (139 lb 9.6 oz), SpO2 100 %. Physical Exam  Constitutional: He appears well-developed.  HENT:  Head: Normocephalic and atraumatic.  Eyes: Conjunctivae and EOM are normal. Pupils are equal, round, and reactive to light.  Respiratory: Effort  normal.  GI: Soft.  Musculoskeletal:       Legs: Neurological: He is alert.  Skin: Skin is warm.  Psychiatric: He has a normal mood and affect. His behavior is normal. Judgment and thought content normal.     Assessment/Plan Plan for debridement and Acell / Vac placement.  CLAIRE S DILLINGHAM 03/07/2015, 2:05 PM    

## 2015-03-08 ENCOUNTER — Encounter (HOSPITAL_COMMUNITY): Payer: Self-pay | Admitting: Plastic Surgery

## 2015-03-14 ENCOUNTER — Encounter (HOSPITAL_COMMUNITY)
Admission: RE | Admit: 2015-03-14 | Discharge: 2015-03-14 | Disposition: A | Discharge status: Home / Self Care | Payer: Medicare Other | Source: Ambulatory Visit | Attending: Plastic Surgery | Admitting: Plastic Surgery

## 2015-03-14 ENCOUNTER — Encounter (HOSPITAL_COMMUNITY): Payer: Self-pay

## 2015-03-14 NOTE — Pre-Procedure Instructions (Signed)
Timothy Norman  03/14/2015     Your procedure is scheduled on : Thursday March 17, 2015 at 1:00 PM.  [facility] at 11:00 AM.  Call this number if you have problems the morning of surgery: [phone]    Remember:  Do not eat food or drink liquids after midnight.  Take these medicines the morning of surgery with A SIP OF WATER : Depakoke, Oxycodone, Pantoprazole (Protonix)   Stop taking any vitamins, herbal medications, or over the counter medications   Do not wear jewelry.  Do not wear lotions, powders, or cologne.    Men may shave face and neck.  Do not bring valuables to the hospital.  [facility] is not responsible for any belongings or valuables.  Contacts, dentures or bridgework may not be worn into surgery.  Leave your suitcase in the car.  After surgery it may be brought to your room.  For patients admitted to the hospital, discharge time will be determined by your treatment team.  Patients discharged the day of surgery will not be allowed to drive home.   Name and phone number of your driver:    Special instructions:  Shower using CHG soap the night before and the morning of your surgery  Please read over the following fact sheets that you were given. Pain Booklet, Coughing and Deep Breathing and Surgical Site Infection Prevention

## 2015-03-14 NOTE — Progress Notes (Signed)
Patient arrived via wheelchair escorted by Nadeen LandauKatrina Smith (transporter) from [facility].   Patient was alert and oriented X 4.  Patient denied having any cardiac or pulmonary issues  Patient had lab work done on 03/07/15, therefore no new labs were obtained today during PAT visit.   Instructions reviewed in detail with transporter Katrina, and Katrina even highlighted the "important parts." Nurse explained to Katrina that it was very important that patient did not have any food after midnight the morning of surgery, as it was documented on the prior surgery that patient consumed a full breakfast before surgery. Katrina stated she would left his Nurse know, because "a lot of things aren't communicated properly at the facility".

## 2015-03-17 ENCOUNTER — Ambulatory Visit (HOSPITAL_COMMUNITY): Payer: Medicare Other | Admitting: Anesthesiology

## 2015-03-17 ENCOUNTER — Other Ambulatory Visit: Payer: Self-pay | Admitting: Plastic Surgery

## 2015-03-17 ENCOUNTER — Encounter (HOSPITAL_COMMUNITY)
Admission: RE | Disposition: A | Discharge status: Home / Self Care | Payer: Self-pay | Source: Ambulatory Visit | Attending: Plastic Surgery

## 2015-03-17 ENCOUNTER — Ambulatory Visit (HOSPITAL_COMMUNITY)
Admission: RE | Admit: 2015-03-17 | Discharge: 2015-03-17 | Disposition: A | Discharge status: Home / Self Care | Payer: Medicare Other | Source: Ambulatory Visit | Attending: Plastic Surgery | Admitting: Plastic Surgery

## 2015-03-17 ENCOUNTER — Encounter (HOSPITAL_COMMUNITY): Payer: Self-pay | Admitting: *Deleted

## 2015-03-17 DIAGNOSIS — Z87891 Personal history of nicotine dependence: Secondary | ICD-10-CM | POA: Diagnosis not present

## 2015-03-17 DIAGNOSIS — S81001A Unspecified open wound, right knee, initial encounter: Secondary | ICD-10-CM | POA: Diagnosis not present

## 2015-03-17 DIAGNOSIS — W19XXXA Unspecified fall, initial encounter: Secondary | ICD-10-CM | POA: Diagnosis not present

## 2015-03-17 DIAGNOSIS — L97802 Non-pressure chronic ulcer of other part of unspecified lower leg with fat layer exposed: Secondary | ICD-10-CM

## 2015-03-17 DIAGNOSIS — S81002A Unspecified open wound, left knee, initial encounter: Secondary | ICD-10-CM | POA: Insufficient documentation

## 2015-03-17 HISTORY — PX: I&D EXTREMITY: SHX5045

## 2015-03-17 SURGERY — IRRIGATION AND DEBRIDEMENT EXTREMITY
Anesthesia: General | Site: Knee | Laterality: Bilateral

## 2015-03-17 MED ORDER — ONDANSETRON HCL 4 MG/2ML IJ SOLN
INTRAMUSCULAR | Status: DC | PRN
  Administered 2015-03-17: 4 mg via INTRAVENOUS

## 2015-03-17 MED ORDER — ROCURONIUM BROMIDE 50 MG/5ML IV SOLN
INTRAVENOUS | Status: AC
  Filled 2015-03-17: qty 1

## 2015-03-17 MED ORDER — LIDOCAINE HCL (CARDIAC) 20 MG/ML IV SOLN
INTRAVENOUS | Status: AC
  Filled 2015-03-17: qty 5

## 2015-03-17 MED ORDER — HEPARIN SODIUM (PORCINE) 1000 UNIT/ML IJ SOLN
INTRAMUSCULAR | Status: AC
  Filled 2015-03-17: qty 1

## 2015-03-17 MED ORDER — FENTANYL CITRATE (PF) 250 MCG/5ML IJ SOLN
INTRAMUSCULAR | Status: AC
  Filled 2015-03-17: qty 5

## 2015-03-17 MED ORDER — ONDANSETRON HCL 4 MG/2ML IJ SOLN
INTRAMUSCULAR | Status: AC
  Filled 2015-03-17: qty 2

## 2015-03-17 MED ORDER — PROPOFOL 10 MG/ML IV BOLUS
INTRAVENOUS | Status: DC | PRN
  Administered 2015-03-17: 160 mg via INTRAVENOUS
  Administered 2015-03-17: 10 mg via INTRAVENOUS

## 2015-03-17 MED ORDER — PROMETHAZINE HCL 25 MG/ML IJ SOLN: 6.2500 mg | INTRAMUSCULAR | Status: DC | PRN

## 2015-03-17 MED ORDER — PROPOFOL 10 MG/ML IV BOLUS
INTRAVENOUS | Status: AC
  Filled 2015-03-17: qty 40

## 2015-03-17 MED ORDER — EPHEDRINE SULFATE 50 MG/ML IJ SOLN
INTRAMUSCULAR | Status: AC
  Filled 2015-03-17: qty 1

## 2015-03-17 MED ORDER — GLYCOPYRROLATE 0.2 MG/ML IJ SOLN
INTRAMUSCULAR | Status: AC
  Filled 2015-03-17: qty 1

## 2015-03-17 MED ORDER — SODIUM CHLORIDE 0.9 % IR SOLN
Status: DC | PRN
  Administered 2015-03-17: 500 mL

## 2015-03-17 MED ORDER — LIDOCAINE HCL (CARDIAC) 20 MG/ML IV SOLN
INTRAVENOUS | Status: DC | PRN
  Administered 2015-03-17: 100 mg via INTRAVENOUS

## 2015-03-17 MED ORDER — LACTATED RINGERS IV SOLN
INTRAVENOUS | Status: DC
  Administered 2015-03-17 (×2): via INTRAVENOUS

## 2015-03-17 MED ORDER — PHENYLEPHRINE HCL 10 MG/ML IJ SOLN
INTRAMUSCULAR | Status: DC | PRN
  Administered 2015-03-17 (×2): 120 ug via INTRAVENOUS
  Administered 2015-03-17: 80 ug via INTRAVENOUS
  Administered 2015-03-17: 160 ug via INTRAVENOUS
  Administered 2015-03-17: 80 ug via INTRAVENOUS
  Administered 2015-03-17: 40 ug via INTRAVENOUS
  Administered 2015-03-17 (×2): 80 ug via INTRAVENOUS

## 2015-03-17 MED ORDER — MIDAZOLAM HCL 2 MG/2ML IJ SOLN
INTRAMUSCULAR | Status: AC
  Filled 2015-03-17: qty 2

## 2015-03-17 MED ORDER — DEXAMETHASONE SODIUM PHOSPHATE 10 MG/ML IJ SOLN
INTRAMUSCULAR | Status: AC
  Filled 2015-03-17: qty 1

## 2015-03-17 MED ORDER — FENTANYL CITRATE (PF) 100 MCG/2ML IJ SOLN
50.0000 ug | INTRAMUSCULAR | Status: DC | PRN
  Administered 2015-03-17 (×2): 50 ug via INTRAVENOUS

## 2015-03-17 MED ORDER — CEFAZOLIN SODIUM-DEXTROSE 2-3 GM-% IV SOLR
INTRAVENOUS | Status: AC
  Filled 2015-03-17: qty 50

## 2015-03-17 MED ORDER — CEFAZOLIN SODIUM-DEXTROSE 2-3 GM-% IV SOLR
2.0000 g | INTRAVENOUS | Status: AC
  Administered 2015-03-17: 2 g via INTRAVENOUS

## 2015-03-17 MED ORDER — DEXAMETHASONE SODIUM PHOSPHATE 4 MG/ML IJ SOLN
INTRAMUSCULAR | Status: AC
  Filled 2015-03-17: qty 2

## 2015-03-17 MED ORDER — FENTANYL CITRATE (PF) 100 MCG/2ML IJ SOLN
INTRAMUSCULAR | Status: DC | PRN
  Administered 2015-03-17 (×2): 100 ug via INTRAVENOUS
  Administered 2015-03-17: 50 ug via INTRAVENOUS

## 2015-03-17 MED ORDER — PROPOFOL 10 MG/ML IV BOLUS
INTRAVENOUS | Status: AC
  Filled 2015-03-17: qty 20

## 2015-03-17 MED ORDER — GLYCOPYRROLATE 0.2 MG/ML IJ SOLN
INTRAMUSCULAR | Status: DC | PRN
  Administered 2015-03-17: .2 mg via INTRAVENOUS
  Administered 2015-03-17: .1 mg via INTRAVENOUS

## 2015-03-17 MED ORDER — ALBUMIN HUMAN 5 % IV SOLN
INTRAVENOUS | Status: DC | PRN
  Administered 2015-03-17: 18:00:00 via INTRAVENOUS

## 2015-03-17 MED ORDER — SODIUM CHLORIDE 0.9 % IJ SOLN
INTRAMUSCULAR | Status: AC
  Filled 2015-03-17: qty 10

## 2015-03-17 MED ORDER — SODIUM CHLORIDE 0.9 % IR SOLN
Status: DC | PRN
  Administered 2015-03-17: 1000 mL

## 2015-03-17 MED ORDER — MIDAZOLAM HCL 5 MG/5ML IJ SOLN
INTRAMUSCULAR | Status: DC | PRN
  Administered 2015-03-17: 1 mg via INTRAVENOUS

## 2015-03-17 MED ORDER — HYDROMORPHONE HCL 1 MG/ML IJ SOLN: 0.2500 mg | INTRAMUSCULAR | Status: DC | PRN

## 2015-03-17 MED ORDER — FENTANYL CITRATE (PF) 100 MCG/2ML IJ SOLN
INTRAMUSCULAR | Status: AC
  Administered 2015-03-17: 50 ug via INTRAVENOUS
  Filled 2015-03-17: qty 2

## 2015-03-17 SURGICAL SUPPLY — 41 items
BANDAGE ELASTIC 4 VELCRO ST LF (GAUZE/BANDAGES/DRESSINGS) IMPLANT
BLADE SURG ROTATE 9660 (MISCELLANEOUS) IMPLANT
BNDG GAUZE ELAST 4 BULKY (GAUZE/BANDAGES/DRESSINGS) IMPLANT
CANISTER SUCTION 2500CC (MISCELLANEOUS) ×3 IMPLANT
CHLORAPREP W/TINT 26ML (MISCELLANEOUS) IMPLANT
COVER SURGICAL LIGHT HANDLE (MISCELLANEOUS) ×3 IMPLANT
DRAPE EXTREMITY T 121X128X90 (DRAPE) IMPLANT
DRAPE ORTHO SPLIT 77X108 STRL (DRAPES) ×4
DRAPE SURG ORHT 6 SPLT 77X108 (DRAPES) ×2 IMPLANT
DRSG ADAPTIC 3X8 NADH LF (GAUZE/BANDAGES/DRESSINGS) ×6 IMPLANT
DRSG PAD ABDOMINAL 8X10 ST (GAUZE/BANDAGES/DRESSINGS) IMPLANT
DRSG VAC ATS LRG SENSATRAC (GAUZE/BANDAGES/DRESSINGS) IMPLANT
DRSG VAC ATS MED SENSATRAC (GAUZE/BANDAGES/DRESSINGS) ×3 IMPLANT
DRSG VAC ATS SM SENSATRAC (GAUZE/BANDAGES/DRESSINGS) ×3 IMPLANT
ELECT REM PT RETURN 9FT ADLT (ELECTROSURGICAL) ×3
ELECTRODE REM PT RTRN 9FT ADLT (ELECTROSURGICAL) ×1 IMPLANT
GAUZE SPONGE 4X4 12PLY STRL (GAUZE/BANDAGES/DRESSINGS) IMPLANT
GLOVE BIO SURGEON STRL SZ 6.5 (GLOVE) ×4 IMPLANT
GLOVE BIO SURGEONS STRL SZ 6.5 (GLOVE) ×2
GOWN STRL REUS W/ TWL LRG LVL3 (GOWN DISPOSABLE) ×2 IMPLANT
GOWN STRL REUS W/TWL LRG LVL3 (GOWN DISPOSABLE) ×4
HANDPIECE INTERPULSE COAX TIP (DISPOSABLE)
KIT BASIN OR (CUSTOM PROCEDURE TRAY) ×3 IMPLANT
KIT ROOM TURNOVER OR (KITS) ×3 IMPLANT
MATRIX SURGICAL PSM 5X5CM (Tissue) ×3 IMPLANT
MATRIX SURGICAL PSMX 10X15CM (Tissue) ×3 IMPLANT
MICROMATRIX 1000MG (Tissue) ×3 IMPLANT
NS IRRIG 1000ML POUR BTL (IV SOLUTION) ×3 IMPLANT
PACK GENERAL/GYN (CUSTOM PROCEDURE TRAY) ×3 IMPLANT
PAD ABD 8X10 STRL (GAUZE/BANDAGES/DRESSINGS) IMPLANT
PAD ARMBOARD 7.5X6 YLW CONV (MISCELLANEOUS) ×6 IMPLANT
PAD NEG PRESSURE SENSATRAC (MISCELLANEOUS) IMPLANT
SET HNDPC FAN SPRY TIP SCT (DISPOSABLE) IMPLANT
SOLUTION PARTIC MCRMTRX 1000MG (Tissue) ×1 IMPLANT
SURGILUBE 2OZ TUBE FLIPTOP (MISCELLANEOUS) ×3 IMPLANT
SUT SILK 4 0 P 3 (SUTURE) ×6 IMPLANT
SUT VIC AB 5-0 PS2 18 (SUTURE) ×15 IMPLANT
TOWEL OR 17X24 6PK STRL BLUE (TOWEL DISPOSABLE) IMPLANT
TOWEL OR 17X26 10 PK STRL BLUE (TOWEL DISPOSABLE) ×3 IMPLANT
UNDERPAD 30X30 INCONTINENT (UNDERPADS AND DIAPERS) ×6 IMPLANT
WATER STERILE IRR 1000ML POUR (IV SOLUTION) IMPLANT

## 2015-03-17 NOTE — Anesthesia Procedure Notes (Signed)
Procedure Name: LMA Insertion Date/Time: 03/17/2015 5:56 PM Performed by: Daiva EvesAVENEL, Saga Balthazar W Pre-anesthesia Checklist: Patient identified, Timeout performed, Emergency Drugs available, Suction available and Patient being monitored Patient Re-evaluated:Patient Re-evaluated prior to inductionOxygen Delivery Method: Circle system utilized Preoxygenation: Pre-oxygenation with 100% oxygen Intubation Type: IV induction LMA: LMA inserted LMA Size: 5.0 Placement Confirmation: positive ETCO2,  CO2 detector and breath sounds checked- equal and bilateral Tube secured with: Tape Dental Injury: Teeth and Oropharynx as per pre-operative assessment

## 2015-03-17 NOTE — Brief Op Note (Signed)
03/17/2015  5:43 PM  PATIENT:  Riccardo DubinWarren D Transue  64 y.o. male  PRE-OPERATIVE DIAGNOSIS:  BILATERAL LOWER LEG EXTREMITY WOUND  POST-OPERATIVE DIAGNOSIS:  BILATERAL LOWER LEG EXTREMITY WOUND  PROCEDURE:  Procedure(s): IRRIGATION AND DEBRIDEMENT BILATERAL KNEE WITH PLACEMENT OF ACCELL AND VAC (Bilateral)  SURGEON:  Surgeon(s) and Role:    * Tian Mcmurtrey S Aaliah Jorgenson, DO - Primary  ASSISTANTS: none   ANESTHESIA:   general  EBL:     BLOOD ADMINISTERED:none  DRAINS: none   LOCAL MEDICATIONS USED:  NONE  SPECIMEN:  No Specimen  DISPOSITION OF SPECIMEN:  N/A  COUNTS:  YES  TOURNIQUET:  * No tourniquets in log *  DICTATION: done  PLAN OF CARE: Discharge to home after PACU  PATIENT DISPOSITION:  PACU - hemodynamically stable.   Delay start of Pharmacological VTE agent (>24hrs) due to surgical blood loss or risk of bleeding: no

## 2015-03-17 NOTE — Progress Notes (Signed)
[facility]. Spoke with Wyvonne Lenzn Wanda and gave discharge instructions and follow up information. Transportation to pick up at 1945.

## 2015-03-17 NOTE — Discharge Instructions (Addendum)
VAC change twice a week starting in one week.

## 2015-03-17 NOTE — Anesthesia Postprocedure Evaluation (Signed)
Anesthesia Post Note  Patient: Timothy Norman  Procedure(s) Performed: Procedure(s) (LRB): IRRIGATION AND DEBRIDEMENT BILATERAL KNEE WITH PLACEMENT OF ACCELL AND VAC (Bilateral)  Patient location during evaluation: PACU Anesthesia Type: General Level of consciousness: awake and alert Pain management: pain level controlled Vital Signs Assessment: post-procedure vital signs reviewed and stable Respiratory status: spontaneous breathing, nonlabored ventilation and respiratory function stable Cardiovascular status: blood pressure returned to baseline and stable Postop Assessment: no signs of nausea or vomiting Anesthetic complications: no    Last Vitals:  Filed Vitals:   03/17/15 1857 03/17/15 1915  BP: 164/97 152/103  Pulse: 87 90  Temp: 36.4 C   Resp: 15 18    Last Pain: There were no vitals filed for this visit.               Alira Fretwell A

## 2015-03-17 NOTE — Anesthesia Preprocedure Evaluation (Addendum)
Anesthesia Evaluation  Patient identified by MRN, date of birth, ID band Patient awake    Reviewed: Allergy & Precautions, NPO status , Patient's Chart, lab work & pertinent test results  Airway Mallampati: II  TM Distance: >3 FB Neck ROM: Full    Dental no notable dental hx. (+) Poor Dentition, Missing, Dental Advisory Given   Pulmonary neg pulmonary ROS, former smoker,    Pulmonary exam normal breath sounds clear to auscultation       Cardiovascular negative cardio ROS Normal cardiovascular exam Rhythm:Regular Rate:Normal     Neuro/Psych negative neurological ROS  negative psych ROS   GI/Hepatic negative GI ROS, (+)     substance abuse  alcohol use and IV drug use,   Endo/Other  negative endocrine ROS  Renal/GU negative Renal ROS  negative genitourinary   Musculoskeletal negative musculoskeletal ROS (+)   Abdominal   Peds negative pediatric ROS (+)  Hematology negative hematology ROS (+)   Anesthesia Other Findings   Reproductive/Obstetrics negative OB ROS                            Anesthesia Physical Anesthesia Plan  ASA: III  Anesthesia Plan: General   Post-op Pain Management:    Induction: Intravenous  Airway Management Planned: LMA  Additional Equipment:   Intra-op Plan:   Post-operative Plan:   Informed Consent: I have reviewed the patients History and Physical, chart, labs and discussed the procedure including the risks, benefits and alternatives for the proposed anesthesia with the patient or authorized representative who has indicated his/her understanding and acceptance.   Dental advisory given  Plan Discussed with: CRNA and Surgeon  Anesthesia Plan Comments:         Anesthesia Quick Evaluation

## 2015-03-17 NOTE — H&P (View-Only) (Signed)
Timothy Norman is an 64 y.o. male.   Chief Complaint: bilateral knee wounds HPI: The patient is a 64 yrs old bm here for treatment of his bilateral leg wounds.  He feel several months ago and sustained very large and deep wounds to bilateral knees.  He underwent debridement with ortho and plastic surgery was consulted.  He had acell placed by us and a patellectomy on the left by ortho.  He presents for further care of both knees.  He is currently in a nursing facility and being treated with the VAC.  Past Medical History  Diagnosis Date  . ETOH abuse   . Muscle weakness   . Sepsis due to other specified Staphylococcus (HCC)   . Anemia   . Encephalopathy     from hospital discharge summary 11/05/14  . Polysubstance abuse     from hospital discharge summary 11/05/14.  . Septic arthritis of knee, left (HCC)     from hospital discharge summary 11/05/14.  Marland Kitchen. Left rib fracture     from hospital discharge summary 11/05/14.  Marland Kitchen. Staphylococcus aureus bacteremia     from hospital discharge summary 11/05/14.  Marland Kitchen. Altered mental status     from hospital discharge summary 11/05/14.  . Wound abscess     from hospital discharge summary 11/05/14.  . Pseudomonas infection     from hospital discharge summary 11/05/14.  Marland Kitchen. Poor dentition     from hospital discharge summary 11/05/14.  . Septic arthritis of knee, right (HCC)     from hospital discharge summary 11/05/14.  Marland Kitchen. Acute renal insufficiency     from hospital discharge summary 11/05/14.  Marland Kitchen. Ulcers of both lower extremities [facility](HCC)     Past Surgical History  Procedure Laterality Date  . Infected skin debridement Bilateral 10/26/2014  . I&d extremity Bilateral 10/26/2014    Procedure: IRRIGATION AND DEBRIDEMENT BILATERAL LEGS;  Surgeon: Tarry KosNaiping M Xu, MD;  Location: [facility] OR;  Service: Orthopedics;  Laterality: Bilateral;  . I&d extremity Bilateral 10/29/2014    Procedure: IRRIGATION AND DEBRIDEMENT BILATERAL KNEES WITH VAC CHANGE;  Surgeon: Tarry KosNaiping M Xu, MD;   Location: [facility] OR;  Service: Orthopedics;  Laterality: Bilateral;  . Tee without cardioversion N/A 11/03/2014    Procedure: TRANSESOPHAGEAL ECHOCARDIOGRAM (TEE);  Surgeon: Wendall StadePeter C Nishan, MD;  [facility];  Service: Cardiovascular;  Laterality: N/A;  . I&d extremity Bilateral 01/19/2015    Procedure: IRRIGATION AND DEBRIDEMENT BILATERAL KNEES  WITH PLACEMENT A CELL on right knee and bilateral knee wound VACs. ;  Surgeon: Alena Billslaire S Dillingham, DO;  Location: [facility] OR;  Service: Plastics;  Laterality: Bilateral;  . Patellectomy Left 01/21/2015    Procedure: PATELLECTOMY;  Surgeon: Tarry KosNaiping M Xu, MD;  Location: [facility] OR;  Service: Orthopedics;  Laterality: Left;  irrigation and debridement left anterior knee with patellectomy   . Application of wound vac Left 01/21/2015    Procedure: APPLICATION OF WOUND VAC LEFT ANTERIOR KNEE;  Surgeon: Tarry KosNaiping M Xu, MD;  Location: [facility] OR;  Service: Orthopedics;  Laterality: Left;    History reviewed. No pertinent family history. Social History:  reports that he quit smoking about 4 months ago. His smoking use included Cigarettes. He smoked 0.25 packs per day. He has never used smokeless tobacco. He reports that he drinks about 1.2 oz of alcohol per week. He reports that he does not use illicit drugs.  Allergies: No Known Allergies  Medications Prior to Admission  Medication Sig Dispense Refill  . Amino Acids-Protein Hydrolys (FEEDING  SUPPLEMENT, PRO-STAT SUGAR FREE 64,) LIQD Take 30 mLs by mouth 2 (two) times daily with a meal. (Patient taking differently: Take 30 mLs by mouth 2 (two) times daily with a meal. 10am and 70m) 900 mL 0  . diphenhydrAMINE (BENADRYL) 25 mg capsule Take 1 capsule (25 mg total) by mouth every 6 (six) hours as needed for itching. 30 capsule 0  . divalproex (DEPAKOTE SPRINKLE) 125 MG capsule Take 375 mg by mouth 2 (two) times daily. 10am and 6pm    . ferrous sulfate 325 (65 FE) MG tablet Take 325 mg by mouth 2 (two) times daily. 10am and 10pm     . folic acid (FOLVITE) 1 MG tablet Take 1 tablet (1 mg total) by mouth daily. 30 tablet 5  . lactose free nutrition (BOOST PLUS) LIQD Take 120 mLs by mouth 3 (three) times daily with meals. 9am, 11am, 6pm    . Multiple Vitamins-Minerals (CERTAVITE/ANTIOXIDANTS) TABS Take 1 tablet by mouth daily. For wound healing    . oxyCODONE (OXY IR/ROXICODONE) 5 MG immediate release tablet Take 1 tablet (5 mg total) by mouth every 6 (six) hours. (Patient taking differently: Take 5 mg by mouth See admin instructions. Take 1 tablet (5 mg) by mouth every 6 hours at 12am, 6am, 12pm, 6pm, also take 1 tablet (5 mg) by mouth 30 minutes prior to dressing change on Monday, Wednesday and Friday) 20 tablet 0  . pantoprazole (PROTONIX) 40 MG tablet Take 40 mg by mouth daily before breakfast.    . polyethylene glycol (MIRALAX / GLYCOLAX) packet Take 17 g by mouth daily as needed for mild constipation. (Patient taking differently: Take 17 g by mouth daily as needed for mild constipation. Mix in 4-8 oz of liquid and drink) 14 each 0  . thiamine 100 MG tablet Take 1 tablet (100 mg total) by mouth daily. 30 tablet 5  . traZODone (DESYREL) 50 MG tablet Take 25 mg by mouth 3 (three) times daily.    . vitamin C (ASCORBIC ACID) 500 MG tablet Take 500 mg by mouth 2 (two) times daily. 10am and 6pm for wound healing      Results for orders placed or performed during the hospital encounter of 03/07/15 (from the past 48 hour(s))  CBC     Status: Abnormal   Collection Time: 03/07/15  1:26 PM  Result Value Ref Range   WBC 6.9 4.0 - 10.5 K/uL   RBC 4.75 4.22 - 5.81 MIL/uL   Hemoglobin 11.4 (L) 13.0 - 17.0 g/dL   HCT 96.0 (L) 45.4 - 09.8 %   MCV 76.8 (L) 78.0 - 100.0 fL   MCH 24.0 (L) 26.0 - 34.0 pg   MCHC 31.2 30.0 - 36.0 g/dL   RDW 11.9 (H) 14.7 - 82.9 %   Platelets 351 150 - 400 K/uL   No results found.  Review of Systems  Constitutional: Negative.   HENT: Negative.   Eyes: Negative.   Respiratory: Negative.     Cardiovascular: Negative.   Gastrointestinal: Negative.   Genitourinary: Negative.   Musculoskeletal: Negative.   Skin: Negative.   Psychiatric/Behavioral: Negative.     Blood pressure 135/71, pulse 92, temperature 97.6 F (36.4 C), temperature source Oral, resp. rate 18, height  (1.676 m), weight 63.322 kg (139 lb 9.6 oz), SpO2 100 %. Physical Exam  Constitutional: He appears well-developed.  HENT:  Head: Normocephalic and atraumatic.  Eyes: Conjunctivae and EOM are normal. Pupils are equal, round, and reactive to light.  Respiratory: Effort  normal.  GI: Soft.  Musculoskeletal:       Legs: Neurological: He is alert.  Skin: Skin is warm.  Psychiatric: He has a normal mood and affect. His behavior is normal. Judgment and thought content normal.     Assessment/Plan Plan for debridement and Acell / Vac placement.  Peggye Form 03/07/2015, 2:05 PM

## 2015-03-17 NOTE — Interval H&P Note (Signed)
History and Physical Interval Note:  03/17/2015 12:22 PM  Timothy Norman  has presented today for surgery, with the diagnosis of BILATERAL LOWER LEG EXTREMITY WOUND  The various methods of treatment have been discussed with the patient and family. After consideration of risks, benefits and other options for treatment, the patient has consented to  Procedure(s): IRRIGATION AND DEBRIDEMENT BILATERAL KNEE WITH PLACEMENT OF ACCELL AND VAC (Bilateral) as a surgical intervention .  The patient's history has been reviewed, patient examined, no change in status, stable for surgery.  I have reviewed the patient's chart and labs.  Questions were answered to the patient's satisfaction.     Peggye FormLAIRE S Diesha Rostad

## 2015-03-17 NOTE — Transfer of Care (Signed)
Immediate Anesthesia Transfer of Care Note  Patient: Timothy Norman  Procedure(s) Performed: Procedure(s): IRRIGATION AND DEBRIDEMENT BILATERAL KNEE WITH PLACEMENT OF ACCELL AND VAC (Bilateral)  Patient Location: PACU  Anesthesia Type:General  Level of Consciousness: awake, alert  and oriented  Airway & Oxygen Therapy: Patient Spontanous Breathing  Post-op Assessment: Report given to RN and Post -op Vital signs reviewed and stable  Post vital signs: Reviewed and stable  Last Vitals:  Filed Vitals:   03/17/15 0956  BP: 109/54  Pulse: 85  Temp: 36.4 C  Resp: 18    Complications: No apparent anesthesia complications

## 2015-03-17 NOTE — Op Note (Signed)
Operative Note   DATE OF OPERATION: 03/17/2015  LOCATION: [facility]   SURGICAL DIVISION: Plastic Surgery  PREOPERATIVE DIAGNOSES:  Bilateral knee wounds (Left 6 x 14 cm) (Right 5 x 4 cm)  POSTOPERATIVE DIAGNOSES:  same  PROCEDURE:   1.preparation of bilateral knees for placement of Acell (powder 1 gm and sheet 10 x 15 and 5 x 5 cm).  SURGEON: Wayland Denislaire Sanger, DO  ANESTHESIA:  General.   COMPLICATIONS: None.   INDICATIONS FOR PROCEDURE:  The patient, Timothy Norman is a 64 y.o. male born on 04/16/1951, is here for treatment of bilateral wound on the knees after a fall two months ago. MRN: [id number]  CONSENT:  Informed consent was obtained directly from the patient. Risks, benefits and alternatives were fully discussed. Specific risks including but not limited to bleeding, infection, hematoma, seroma, scarring, pain, infection, contracture, asymmetry, wound healing problems, and need for further surgery were all discussed. The patient did have an ample opportunity to have questions answered to satisfaction.   DESCRIPTION OF PROCEDURE:  The patient was taken to the operating room and IV antibiotics were given. The patient's operative site was prepped and draped in a sterile fashion. A time out was performed and all information was confirmed to be correct.  General anesthesia was administered.  The wounds were irrigated with antibiotic solution.   Right knee:  Hemostasis was achieved with electrocautery.  The Acell powder 500 mg and all of the 5 x 4 cm sheet was applied.  The sheet was secured with 5-0 Vicryl. Adaptic was placed.  The KY gel and VAC was applied. Left knee:  The wound was irrigated as described above.  Hemostasis was achieved with electrocautery (6 x 14 cm).  The Acell powder (500 mg) and all of the sheet (10 x 15 cm) was placed and secured with 5-0 Vicryl.  Adaptic was placed and secured with 4-0 Silk.  The KY gel and VAC were applied and the pressure set at  125 mmHg on both.  The patient tolerated the procedure well.  There were no complications. The patient was allowed to wake from anesthesia, extubated and taken to the recovery room in satisfactory condition.

## 2015-03-18 ENCOUNTER — Encounter (HOSPITAL_COMMUNITY): Payer: Self-pay | Admitting: Plastic Surgery

## 2015-03-21 ENCOUNTER — Encounter (HOSPITAL_COMMUNITY): Payer: Self-pay | Admitting: Plastic Surgery

## 2015-03-21 ENCOUNTER — Encounter (HOSPITAL_BASED_OUTPATIENT_CLINIC_OR_DEPARTMENT_OTHER): Payer: Medicare Other | Attending: Plastic Surgery

## 2015-03-21 DIAGNOSIS — L97124 Non-pressure chronic ulcer of left thigh with necrosis of bone: Secondary | ICD-10-CM | POA: Insufficient documentation

## 2015-03-21 DIAGNOSIS — F1024 Alcohol dependence with alcohol-induced mood disorder: Secondary | ICD-10-CM | POA: Diagnosis not present

## 2015-03-21 DIAGNOSIS — L97114 Non-pressure chronic ulcer of right thigh with necrosis of bone: Secondary | ICD-10-CM | POA: Diagnosis present

## 2015-03-21 DIAGNOSIS — M1990 Unspecified osteoarthritis, unspecified site: Secondary | ICD-10-CM | POA: Insufficient documentation

## 2015-03-21 DIAGNOSIS — Z590 Homelessness: Secondary | ICD-10-CM | POA: Diagnosis not present

## 2015-03-21 DIAGNOSIS — F17208 Nicotine dependence, unspecified, with other nicotine-induced disorders: Secondary | ICD-10-CM | POA: Diagnosis not present

## 2015-03-21 NOTE — OR Nursing (Signed)
Made correction to implant log.  Deleted duplicate item and imput correct item and information.

## 2015-03-28 DIAGNOSIS — L97124 Non-pressure chronic ulcer of left thigh with necrosis of bone: Secondary | ICD-10-CM | POA: Diagnosis not present

## 2015-03-28 DIAGNOSIS — F17208 Nicotine dependence, unspecified, with other nicotine-induced disorders: Secondary | ICD-10-CM | POA: Diagnosis not present

## 2015-03-28 DIAGNOSIS — F1024 Alcohol dependence with alcohol-induced mood disorder: Secondary | ICD-10-CM | POA: Diagnosis not present

## 2015-03-28 DIAGNOSIS — L97114 Non-pressure chronic ulcer of right thigh with necrosis of bone: Secondary | ICD-10-CM | POA: Diagnosis not present

## 2015-04-25 ENCOUNTER — Encounter (HOSPITAL_BASED_OUTPATIENT_CLINIC_OR_DEPARTMENT_OTHER): Payer: Medicare Other | Attending: Internal Medicine

## 2015-04-25 DIAGNOSIS — D649 Anemia, unspecified: Secondary | ICD-10-CM | POA: Diagnosis not present

## 2015-04-25 DIAGNOSIS — L97122 Non-pressure chronic ulcer of left thigh with fat layer exposed: Secondary | ICD-10-CM | POA: Insufficient documentation

## 2015-04-25 DIAGNOSIS — L97112 Non-pressure chronic ulcer of right thigh with fat layer exposed: Secondary | ICD-10-CM | POA: Diagnosis present

## 2015-04-25 DIAGNOSIS — F1014 Alcohol abuse with alcohol-induced mood disorder: Secondary | ICD-10-CM | POA: Insufficient documentation

## 2015-04-25 DIAGNOSIS — M1990 Unspecified osteoarthritis, unspecified site: Secondary | ICD-10-CM | POA: Diagnosis not present

## 2015-04-25 DIAGNOSIS — Z590 Homelessness: Secondary | ICD-10-CM | POA: Insufficient documentation

## 2015-04-25 DIAGNOSIS — F17208 Nicotine dependence, unspecified, with other nicotine-induced disorders: Secondary | ICD-10-CM | POA: Insufficient documentation

## 2015-05-09 DIAGNOSIS — L97112 Non-pressure chronic ulcer of right thigh with fat layer exposed: Secondary | ICD-10-CM | POA: Diagnosis not present

## 2015-05-23 ENCOUNTER — Encounter (HOSPITAL_BASED_OUTPATIENT_CLINIC_OR_DEPARTMENT_OTHER): Payer: Medicare Other | Attending: Internal Medicine

## 2015-05-23 DIAGNOSIS — F17208 Nicotine dependence, unspecified, with other nicotine-induced disorders: Secondary | ICD-10-CM | POA: Diagnosis not present

## 2015-05-23 DIAGNOSIS — Z590 Homelessness: Secondary | ICD-10-CM | POA: Diagnosis not present

## 2015-05-23 DIAGNOSIS — L97121 Non-pressure chronic ulcer of left thigh limited to breakdown of skin: Secondary | ICD-10-CM | POA: Diagnosis not present

## 2015-05-23 DIAGNOSIS — F1024 Alcohol dependence with alcohol-induced mood disorder: Secondary | ICD-10-CM | POA: Insufficient documentation

## 2015-05-23 DIAGNOSIS — L97119 Non-pressure chronic ulcer of right thigh with unspecified severity: Secondary | ICD-10-CM | POA: Insufficient documentation

## 2015-05-23 DIAGNOSIS — M1990 Unspecified osteoarthritis, unspecified site: Secondary | ICD-10-CM | POA: Insufficient documentation

## 2015-05-23 DIAGNOSIS — L97122 Non-pressure chronic ulcer of left thigh with fat layer exposed: Secondary | ICD-10-CM | POA: Insufficient documentation

## 2015-05-23 DIAGNOSIS — D649 Anemia, unspecified: Secondary | ICD-10-CM | POA: Diagnosis not present

## 2015-06-13 ENCOUNTER — Emergency Department (HOSPITAL_COMMUNITY): Payer: Medicare Other

## 2015-06-13 ENCOUNTER — Encounter (HOSPITAL_COMMUNITY): Payer: Self-pay

## 2015-06-13 ENCOUNTER — Inpatient Hospital Stay (HOSPITAL_COMMUNITY)
Admission: EM | Admit: 2015-06-13 | Discharge: 2015-06-21 | DRG: 871 | Disposition: A | Discharge status: Skilled Nursing Facility | Payer: Medicare Other | Attending: Internal Medicine | Admitting: Internal Medicine

## 2015-06-13 DIAGNOSIS — E43 Unspecified severe protein-calorie malnutrition: Secondary | ICD-10-CM | POA: Diagnosis present

## 2015-06-13 DIAGNOSIS — N390 Urinary tract infection, site not specified: Secondary | ICD-10-CM | POA: Diagnosis present

## 2015-06-13 DIAGNOSIS — L8990 Pressure ulcer of unspecified site, unspecified stage: Secondary | ICD-10-CM | POA: Insufficient documentation

## 2015-06-13 DIAGNOSIS — I313 Pericardial effusion (noninflammatory): Secondary | ICD-10-CM

## 2015-06-13 DIAGNOSIS — M868X8 Other osteomyelitis, other site: Secondary | ICD-10-CM | POA: Diagnosis present

## 2015-06-13 DIAGNOSIS — B9562 Methicillin resistant Staphylococcus aureus infection as the cause of diseases classified elsewhere: Secondary | ICD-10-CM | POA: Diagnosis present

## 2015-06-13 DIAGNOSIS — A419 Sepsis, unspecified organism: Secondary | ICD-10-CM | POA: Diagnosis not present

## 2015-06-13 DIAGNOSIS — R52 Pain, unspecified: Secondary | ICD-10-CM

## 2015-06-13 DIAGNOSIS — R197 Diarrhea, unspecified: Secondary | ICD-10-CM | POA: Diagnosis not present

## 2015-06-13 DIAGNOSIS — Z87891 Personal history of nicotine dependence: Secondary | ICD-10-CM

## 2015-06-13 DIAGNOSIS — I319 Disease of pericardium, unspecified: Secondary | ICD-10-CM | POA: Diagnosis not present

## 2015-06-13 DIAGNOSIS — Z681 Body mass index (BMI) 19 or less, adult: Secondary | ICD-10-CM

## 2015-06-13 DIAGNOSIS — G9340 Encephalopathy, unspecified: Secondary | ICD-10-CM | POA: Diagnosis not present

## 2015-06-13 DIAGNOSIS — R1032 Left lower quadrant pain: Secondary | ICD-10-CM

## 2015-06-13 DIAGNOSIS — L89310 Pressure ulcer of right buttock, unstageable: Secondary | ICD-10-CM | POA: Diagnosis present

## 2015-06-13 DIAGNOSIS — M25561 Pain in right knee: Secondary | ICD-10-CM

## 2015-06-13 DIAGNOSIS — E872 Acidosis: Secondary | ICD-10-CM | POA: Diagnosis present

## 2015-06-13 DIAGNOSIS — M8668 Other chronic osteomyelitis, other site: Secondary | ICD-10-CM | POA: Diagnosis not present

## 2015-06-13 DIAGNOSIS — E860 Dehydration: Secondary | ICD-10-CM | POA: Diagnosis present

## 2015-06-13 DIAGNOSIS — T07XXXA Unspecified multiple injuries, initial encounter: Secondary | ICD-10-CM

## 2015-06-13 DIAGNOSIS — I3139 Other pericardial effusion (noninflammatory): Secondary | ICD-10-CM

## 2015-06-13 DIAGNOSIS — L03119 Cellulitis of unspecified part of limb: Secondary | ICD-10-CM

## 2015-06-13 DIAGNOSIS — M25562 Pain in left knee: Secondary | ICD-10-CM

## 2015-06-13 DIAGNOSIS — A4101 Sepsis due to Methicillin susceptible Staphylococcus aureus: Secondary | ICD-10-CM | POA: Diagnosis not present

## 2015-06-13 DIAGNOSIS — R9389 Abnormal findings on diagnostic imaging of other specified body structures: Secondary | ICD-10-CM

## 2015-06-13 DIAGNOSIS — R109 Unspecified abdominal pain: Secondary | ICD-10-CM

## 2015-06-13 DIAGNOSIS — A4102 Sepsis due to Methicillin resistant Staphylococcus aureus: Secondary | ICD-10-CM | POA: Diagnosis not present

## 2015-06-13 DIAGNOSIS — T1490 Injury, unspecified: Secondary | ICD-10-CM | POA: Diagnosis not present

## 2015-06-13 LAB — COMPREHENSIVE METABOLIC PANEL
ALT: 9 U/L — AB (ref 17–63)
AST: 27 U/L (ref 15–41)
Albumin: 2.6 g/dL — ABNORMAL LOW (ref 3.5–5.0)
Alkaline Phosphatase: 89 U/L (ref 38–126)
Anion gap: 19 — ABNORMAL HIGH (ref 5–15)
BUN: 14 mg/dL (ref 6–20)
CHLORIDE: 99 mmol/L — AB (ref 101–111)
CO2: 23 mmol/L (ref 22–32)
Calcium: 8.8 mg/dL — ABNORMAL LOW (ref 8.9–10.3)
Creatinine, Ser: 0.88 mg/dL (ref 0.61–1.24)
Glucose, Bld: 126 mg/dL — ABNORMAL HIGH (ref 65–99)
Potassium: 4 mmol/L (ref 3.5–5.1)
Sodium: 141 mmol/L (ref 135–145)
TOTAL PROTEIN: 7 g/dL (ref 6.5–8.1)
Total Bilirubin: 1.2 mg/dL (ref 0.3–1.2)

## 2015-06-13 LAB — URINALYSIS, ROUTINE W REFLEX MICROSCOPIC
Glucose, UA: NEGATIVE mg/dL
KETONES UR: 40 mg/dL — AB
Nitrite: POSITIVE — AB
PROTEIN: 30 mg/dL — AB
Specific Gravity, Urine: 1.023 (ref 1.005–1.030)
pH: 6 (ref 5.0–8.0)

## 2015-06-13 LAB — CBC WITH DIFFERENTIAL/PLATELET
Basophils Absolute: 0 10*3/uL (ref 0.0–0.1)
Basophils Relative: 0 %
EOS PCT: 0 %
Eosinophils Absolute: 0 10*3/uL (ref 0.0–0.7)
HCT: 38.9 % — ABNORMAL LOW (ref 39.0–52.0)
Hemoglobin: 11.7 g/dL — ABNORMAL LOW (ref 13.0–17.0)
LYMPHS ABS: 0.9 10*3/uL (ref 0.7–4.0)
LYMPHS PCT: 7 %
MCH: 24.5 pg — AB (ref 26.0–34.0)
MCHC: 30.1 g/dL (ref 30.0–36.0)
MCV: 81.6 fL (ref 78.0–100.0)
MONO ABS: 0.9 10*3/uL (ref 0.1–1.0)
MONOS PCT: 6 %
Neutro Abs: 11.7 10*3/uL — ABNORMAL HIGH (ref 1.7–7.7)
Neutrophils Relative %: 87 %
PLATELETS: 561 10*3/uL — AB (ref 150–400)
RBC: 4.77 MIL/uL (ref 4.22–5.81)
RDW: 20.4 % — ABNORMAL HIGH (ref 11.5–15.5)
WBC: 13.4 10*3/uL — ABNORMAL HIGH (ref 4.0–10.5)

## 2015-06-13 LAB — VALPROIC ACID LEVEL: Valproic Acid Lvl: 41 ug/mL — ABNORMAL LOW (ref 50.0–100.0)

## 2015-06-13 LAB — URINE MICROSCOPIC-ADD ON

## 2015-06-13 LAB — PROTIME-INR
INR: 1.34 (ref 0.00–1.49)
Prothrombin Time: 16.2 seconds — ABNORMAL HIGH (ref 11.6–15.2)

## 2015-06-13 LAB — I-STAT CG4 LACTIC ACID, ED
LACTIC ACID, VENOUS: 5.92 mmol/L — AB (ref 0.5–2.0)
Lactic Acid, Venous: 2.54 mmol/L (ref 0.5–2.0)

## 2015-06-13 LAB — LACTIC ACID, PLASMA
LACTIC ACID, VENOUS: 2.6 mmol/L — AB (ref 0.5–2.0)
Lactic Acid, Venous: 2.4 mmol/L (ref 0.5–2.0)

## 2015-06-13 LAB — C DIFFICILE QUICK SCREEN W PCR REFLEX
C Diff antigen: NEGATIVE
C Diff interpretation: NEGATIVE
C Diff toxin: NEGATIVE

## 2015-06-13 LAB — APTT: APTT: 56 s — AB (ref 24–37)

## 2015-06-13 LAB — MRSA PCR SCREENING: MRSA by PCR: NEGATIVE

## 2015-06-13 LAB — PROCALCITONIN: PROCALCITONIN: 0.59 ng/mL

## 2015-06-13 MED ORDER — BOOST PLUS PO LIQD
120.0000 mL | Freq: Three times a day (TID) | ORAL | Status: DC
  Filled 2015-06-13 (×12): qty 237

## 2015-06-13 MED ORDER — ONDANSETRON HCL 4 MG/2ML IJ SOLN
4.0000 mg | Freq: Once | INTRAMUSCULAR | Status: AC
  Administered 2015-06-13: 4 mg via INTRAVENOUS
  Filled 2015-06-13: qty 2

## 2015-06-13 MED ORDER — VANCOMYCIN HCL IN DEXTROSE 1-5 GM/200ML-% IV SOLN
1000.0000 mg | Freq: Once | INTRAVENOUS | Status: AC
  Administered 2015-06-13: 1000 mg via INTRAVENOUS
  Filled 2015-06-13: qty 200

## 2015-06-13 MED ORDER — ACETAMINOPHEN 325 MG PO TABS
650.0000 mg | ORAL_TABLET | Freq: Four times a day (QID) | ORAL | Status: DC | PRN
  Administered 2015-06-16 – 2015-06-17 (×2): 650 mg via ORAL
  Filled 2015-06-13 (×2): qty 2

## 2015-06-13 MED ORDER — ACETAMINOPHEN 650 MG RE SUPP: 650.0000 mg | Freq: Four times a day (QID) | RECTAL | Status: DC | PRN

## 2015-06-13 MED ORDER — ONDANSETRON HCL 4 MG PO TABS: 4.0000 mg | ORAL_TABLET | Freq: Four times a day (QID) | ORAL | Status: DC | PRN

## 2015-06-13 MED ORDER — SODIUM CHLORIDE 0.9 % IV BOLUS (SEPSIS)
1000.0000 mL | Freq: Once | INTRAVENOUS | Status: AC
  Administered 2015-06-13: 1000 mL via INTRAVENOUS

## 2015-06-13 MED ORDER — SODIUM CHLORIDE 0.9 % IV BOLUS (SEPSIS)
500.0000 mL | Freq: Once | INTRAVENOUS | Status: AC
  Administered 2015-06-13: 500 mL via INTRAVENOUS

## 2015-06-13 MED ORDER — ONDANSETRON HCL 4 MG/2ML IJ SOLN: 4.0000 mg | Freq: Four times a day (QID) | INTRAMUSCULAR | Status: DC | PRN

## 2015-06-13 MED ORDER — MORPHINE SULFATE (PF) 2 MG/ML IV SOLN
2.0000 mg | INTRAVENOUS | Status: DC | PRN
  Administered 2015-06-13 – 2015-06-19 (×19): 2 mg via INTRAVENOUS
  Filled 2015-06-13 (×20): qty 1

## 2015-06-13 MED ORDER — VANCOMYCIN HCL IN DEXTROSE 1-5 GM/200ML-% IV SOLN
1000.0000 mg | INTRAVENOUS | Status: DC
  Administered 2015-06-14 – 2015-06-15 (×2): 1000 mg via INTRAVENOUS
  Filled 2015-06-13 (×2): qty 200

## 2015-06-13 MED ORDER — DIVALPROEX SODIUM 500 MG PO DR TAB
500.0000 mg | DELAYED_RELEASE_TABLET | Freq: Two times a day (BID) | ORAL | Status: DC
  Administered 2015-06-13 – 2015-06-21 (×16): 500 mg via ORAL
  Filled 2015-06-13 (×21): qty 1

## 2015-06-13 MED ORDER — DEXTROSE 5 % IV SOLN
2.0000 g | Freq: Once | INTRAVENOUS | Status: AC
  Administered 2015-06-13: 2 g via INTRAVENOUS
  Filled 2015-06-13: qty 2

## 2015-06-13 MED ORDER — FOLIC ACID 1 MG PO TABS
1.0000 mg | ORAL_TABLET | Freq: Every day | ORAL | Status: DC
  Administered 2015-06-14 – 2015-06-21 (×8): 1 mg via ORAL
  Filled 2015-06-13 (×8): qty 1

## 2015-06-13 MED ORDER — VITAMIN B-1 100 MG PO TABS
100.0000 mg | ORAL_TABLET | Freq: Every day | ORAL | Status: DC
  Administered 2015-06-14 – 2015-06-21 (×8): 100 mg via ORAL
  Filled 2015-06-13 (×8): qty 1

## 2015-06-13 MED ORDER — ADULT MULTIVITAMIN W/MINERALS CH
1.0000 | ORAL_TABLET | Freq: Every day | ORAL | Status: DC
Start: 2015-06-14 — End: 2015-06-21
  Administered 2015-06-14 – 2015-06-21 (×8): 1 via ORAL
  Filled 2015-06-13 (×11): qty 1

## 2015-06-13 MED ORDER — DEXTROSE 5 % IV SOLN
1.0000 g | Freq: Three times a day (TID) | INTRAVENOUS | Status: DC
  Administered 2015-06-13 – 2015-06-17 (×11): 1 g via INTRAVENOUS
  Filled 2015-06-13 (×15): qty 1

## 2015-06-13 MED ORDER — FERROUS SULFATE 325 (65 FE) MG PO TABS
325.0000 mg | ORAL_TABLET | Freq: Two times a day (BID) | ORAL | Status: DC
Start: 2015-06-13 — End: 2015-06-21
  Administered 2015-06-13 – 2015-06-21 (×16): 325 mg via ORAL
  Filled 2015-06-13 (×18): qty 1

## 2015-06-13 MED ORDER — SODIUM CHLORIDE 0.9% FLUSH
3.0000 mL | Freq: Two times a day (BID) | INTRAVENOUS | Status: DC
  Administered 2015-06-13 – 2015-06-18 (×10): 3 mL via INTRAVENOUS

## 2015-06-13 MED ORDER — PANTOPRAZOLE SODIUM 40 MG PO TBEC
40.0000 mg | DELAYED_RELEASE_TABLET | Freq: Every day | ORAL | Status: DC
  Administered 2015-06-14 – 2015-06-21 (×8): 40 mg via ORAL
  Filled 2015-06-13 (×10): qty 1

## 2015-06-13 MED ORDER — LOPERAMIDE HCL 2 MG PO CAPS
2.0000 mg | ORAL_CAPSULE | Freq: Three times a day (TID) | ORAL | Status: DC | PRN
  Administered 2015-06-13: 2 mg via ORAL
  Filled 2015-06-13: qty 1

## 2015-06-13 MED ORDER — ENOXAPARIN SODIUM 40 MG/0.4ML ~~LOC~~ SOLN
40.0000 mg | SUBCUTANEOUS | Status: DC
  Administered 2015-06-13 – 2015-06-20 (×8): 40 mg via SUBCUTANEOUS
  Filled 2015-06-13 (×9): qty 0.4

## 2015-06-13 MED ORDER — MORPHINE SULFATE (PF) 4 MG/ML IV SOLN
4.0000 mg | INTRAVENOUS | Status: DC | PRN
  Administered 2015-06-13 (×2): 4 mg via INTRAVENOUS
  Filled 2015-06-13 (×2): qty 1

## 2015-06-13 MED ORDER — OXYCODONE HCL 5 MG PO TABS
5.0000 mg | ORAL_TABLET | Freq: Four times a day (QID) | ORAL | Status: DC | PRN
  Administered 2015-06-13 – 2015-06-21 (×12): 5 mg via ORAL
  Filled 2015-06-13 (×12): qty 1

## 2015-06-13 MED ORDER — SODIUM CHLORIDE 0.9 % IV SOLN
INTRAVENOUS | Status: AC
  Administered 2015-06-13 – 2015-06-14 (×2): via INTRAVENOUS

## 2015-06-13 MED ORDER — MORPHINE SULFATE (PF) 4 MG/ML IV SOLN
4.0000 mg | Freq: Once | INTRAVENOUS | Status: AC
  Administered 2015-06-13: 4 mg via INTRAVENOUS
  Filled 2015-06-13: qty 1
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
## 2015-06-13 NOTE — ED Notes (Signed)
Pt continually asking for diarrhea and pain medications.  Pt refusing to be placed on bed pan, because "it could be loose or it may be solid."  Pt educated that consistency does not matter.  Pt still refusing bedpan.  Call light within reach and Pt educated on use.

## 2015-06-13 NOTE — ED Notes (Signed)
DR.JAMES AND RN NOTIFIED OF PT'S LACTIC OF 2.54

## 2015-06-13 NOTE — Progress Notes (Addendum)
Pharmacy Antibiotic Note  ZAYAAN KOZAK is a 65 y.o. male admitted on 06/13/2015 with possible pneumonia.  Pharmacy has been consulted for vancomycin dosing. Cefepime ordered x 1 in ED.  Today, 06/13/2015: Temp: afebrile WBC: elevated Renal: SCr 0.88; CrCl 58 CG d/t low weight  Plan:  Vancomycin 1000 mg IV q24 hr; no add'l load necessary.  Goal trough 15-20 mcg/mL  Measure vancomycin trough levels at steady state as indicated  Cefepime 1 g IV q8 hr  Follow clinical course, renal function, culture results as available  Follow for de-escalation of antibiotics and LOT    Temp (24hrs), Avg:97.7 F (36.5 C), Min:97.7 F (36.5 C), Max:97.7 F (36.5 C)   Recent Labs Lab 06/13/15 1119 06/13/15 1259 06/13/15 1500  WBC  --  13.4*  --   CREATININE  --  0.88  --   LATICACIDVEN 5.92*  --  2.54*    CrCl cannot be calculated (Unknown ideal weight.).    No Known Allergies  Antimicrobials this admission: 2/27 vancomycin >>  2/27 cefepime >>  Dose adjustments this admission: ---  Microbiology results: 2/27 BCx: sent 2/27 UCx: sent  2/27 C diff: [place] Hx pseudomonas, MSSA in knee joint; MSSA, CoNS, and S bovis in blood from 10/2014  Thank you for allowing pharmacy to be a part of this patient's care.  Bernadene Person, PharmD, BCPS Pager: [phone] 06/13/2015, 3:58 PM

## 2015-06-13 NOTE — ED Notes (Signed)
Patient refusing rectal temp. Also stated he needed to go to the bathroom, attempted to place patient on a bedpan. Pt refused the bedpan, stating he would hold it until he could walk to the bathroom. Informed patient that he would not be able to walk to the bathroom with a blood pressure of 80/60 due to the risk of falling from passing out.

## 2015-06-13 NOTE — ED Notes (Signed)
Bed: WA08 Expected date:  Expected time:  Means of arrival:  Comments: HR 130 - wound care

## 2015-06-13 NOTE — ED Provider Notes (Signed)
CSN: [id number]     Arrival date & time 06/13/15  1045 History   First MD Initiated Contact with Patient 06/13/15 1054     Chief Complaint  Patient presents with  . Weakness  . Diarrhea      HPI  Patient resides for evaluation of diarrhea abdominal pain and low blood pressures. 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 Follows with wound care clinic. Was there today. BP 80 systolic, heart rate 1:30. Complains of diarrhea.  Past Medical History  Diagnosis Date  . ETOH abuse   . Muscle weakness   . Sepsis due to other specified Staphylococcus (HCC)   . Anemia   . Encephalopathy     from hospital discharge summary 11/05/14  . Polysubstance abuse     from hospital discharge summary 11/05/14.  . Septic arthritis of knee, left (HCC)     from hospital discharge summary 11/05/14.  Marland Kitchen Left rib fracture     from hospital discharge summary 11/05/14.  Marland Kitchen Staphylococcus aureus bacteremia     from hospital discharge summary 11/05/14.  Marland Kitchen Altered mental status     from hospital discharge summary 11/05/14.  . Wound abscess     from hospital discharge summary 11/05/14.  . Pseudomonas infection     from hospital discharge summary 11/05/14.  Marland Kitchen Poor dentition     from hospital discharge summary 11/05/14.  . Septic arthritis of knee, right (HCC)     from hospital discharge summary 11/05/14.  Marland Kitchen Acute renal insufficiency     from hospital discharge summary 11/05/14.  Marland Kitchen Ulcers of both lower extremities [facility])    Past Surgical History  Procedure Laterality Date  . Infected skin debridement Bilateral 10/26/2014  . I&d extremity Bilateral 10/26/2014    Procedure: IRRIGATION AND DEBRIDEMENT BILATERAL LEGS;  Surgeon: Tarry Kos, MD;  Location: [facility] OR;  Service: Orthopedics;  Laterality: Bilateral;  . I&d extremity Bilateral 10/29/2014    Procedure: IRRIGATION  AND DEBRIDEMENT BILATERAL KNEES WITH VAC CHANGE;  Surgeon: Tarry Kos, MD;  Location: [facility] OR;  Service: Orthopedics;  Laterality: Bilateral;  . Tee without cardioversion N/A 11/03/2014    Procedure: TRANSESOPHAGEAL ECHOCARDIOGRAM (TEE);  Surgeon: Wendall Stade, MD;  Location: [facility] ENDOSCOPY;  Service: Cardiovascular;  Laterality: N/A;  . I&d extremity Bilateral 01/19/2015    Procedure: IRRIGATION AND DEBRIDEMENT BILATERAL KNEES  WITH PLACEMENT A CELL on right knee and bilateral knee wound VACs. ;  Surgeon: Alena Bills Dillingham, DO;  Location: [facility] OR;  Service: Plastics;  Laterality: Bilateral;  . Patellectomy Left 01/21/2015    Procedure: PATELLECTOMY;  Surgeon: Tarry Kos, MD;  Location: [facility] OR;  Service: Orthopedics;  Laterality: Left;  irrigation and debridement left anterior knee with patellectomy   . Application of wound vac Left 01/21/2015    Procedure: APPLICATION OF WOUND VAC LEFT ANTERIOR KNEE;  Surgeon: Tarry Kos, MD;  Location: [facility] OR;  Service: Orthopedics;  Laterality: Left;  . I&d extremity Bilateral 03/17/2015    Procedure: IRRIGATION AND DEBRIDEMENT BILATERAL KNEE WITH PLACEMENT OF ACCELL AND VAC;  Surgeon: Alena Bills Dillingham, DO;  Location: [facility] OR;  Service: Plastics;  Laterality: Bilateral;  . I&d extremity Bilateral 03/07/2015    Procedure: IRRIGATION AND DEBRIDEMENT OF BILATARAL KNEES;  Surgeon: Alena Bills Dillingham, DO;  Location: [facility] OR;  Service: Plastics;  Laterality: Bilateral;  . Application of  a-cell of extremity Bilateral 03/07/2015    Procedure: APPLICATION OF A-CELL and wound vac exchange to bilateral knees;  Surgeon: Alena Bills Dillingham, DO;  Location: [facility] OR;  Service: Plastics;  Laterality: Bilateral;   History reviewed. No pertinent family history. Social History  Substance Use Topics  . Smoking status: Former Smoker -- 0.25 packs/day    Types: Cigarettes    Quit date: 10/18/2014  . Smokeless tobacco: Never Used  . Alcohol Use: 1.2 oz/week    2 Cans of beer per week     Review of Systems  Constitutional: Negative for fever, chills, diaphoresis, appetite change and fatigue.  HENT: Negative for mouth sores, sore throat and trouble swallowing.   Eyes: Negative for visual disturbance.  Respiratory: Negative for cough, chest tightness, shortness of breath and wheezing.   Cardiovascular: Negative for chest pain.  Gastrointestinal: Positive for nausea. Negative for vomiting, abdominal pain, diarrhea and abdominal distention.  Endocrine: Negative for polydipsia, polyphagia and polyuria.  Genitourinary: Negative for dysuria, frequency and hematuria.  Musculoskeletal: Positive for arthralgias. Negative for gait problem.  Skin: Negative for color change, pallor and rash.  Neurological: Positive for dizziness and light-headedness. Negative for syncope and headaches.  Hematological: Does not bruise/bleed easily.  Psychiatric/Behavioral: Negative for behavioral problems and confusion.      Allergies  Review of patient's allergies indicates no known allergies.  Home Medications   Prior to Admission medications   Medication Sig Start Date End Date Taking? Authorizing Provider  divalproex (DEPAKOTE) 500 MG DR tablet Take 500 mg by mouth 2 (two) times daily.   Yes Historical Provider, MD  ferrous sulfate 325 (65 FE) MG tablet Take 325 mg by mouth 2 (two) times daily. 10am and 10pm   Yes Historical Provider, MD  folic acid (FOLVITE) 1 MG tablet Take 1 tablet (1 mg total) by mouth daily. 11/05/14  Yes Beaulah Dinning, MD  lactose free nutrition (BOOST PLUS) LIQD Take 120 mLs by mouth 3 (three) times daily with meals. 9am, 11am, 6pm   Yes Historical Provider, MD  Multiple Vitamins-Minerals (CERTAVITE/ANTIOXIDANTS) TABS Take 1 tablet by mouth daily. For wound healing   Yes Historical Provider, MD  Nutritional Supplements (RESOURCE 2.0) LIQD Take 120 mLs by mouth 3 (three) times daily.   Yes Historical Provider, MD  oxyCODONE (OXY IR/ROXICODONE) 5 MG immediate  release tablet Take 1 tablet (5 mg total) by mouth every 6 (six) hours. Patient taking differently: Take 5 mg by mouth every 6 (six) hours as needed for moderate pain or severe pain.  11/05/14  Yes Beaulah Dinning, MD  pantoprazole (PROTONIX) 40 MG tablet Take 40 mg by mouth daily before breakfast.   Yes Historical Provider, MD  thiamine 100 MG tablet Take 1 tablet (100 mg total) by mouth daily. 11/05/14  Yes Beaulah Dinning, MD  traZODone (DESYREL) 50 MG tablet Take 25 mg by mouth 3 (three) times daily.   Yes Historical Provider, MD  vitamin C (ASCORBIC ACID) 500 MG tablet Take 500 mg by mouth 2 (two) times daily. 10am and 6pm for wound healing   Yes Historical Provider, MD   BP 131/82 mmHg  Pulse 109  Temp(Src) 97.7 F (36.5 C) (Axillary)  Resp 18  SpO2 90% Physical Exam  Constitutional: He is oriented to person, place, and time. He appears cachectic. No distress.  Thin frail cachectic appearing  HENT:  Head: Normocephalic.  Eyes: Conjunctivae are normal. Pupils are equal, round, and reactive to light. No scleral icterus.  Neck:  Normal range of motion. Neck supple. No thyromegaly present.  Cardiovascular: Normal rate and regular rhythm.  Exam reveals no gallop and no friction rub.   No murmur heard. Pulmonary/Chest: Effort normal and breath sounds normal. No respiratory distress. He has no wheezes. He has no rales.  Abdominal: Soft. Bowel sounds are normal. He exhibits no distension. There is no tenderness. There is no rebound.  Soft benign abdomen. No guarding rebound or peritoneal irritation  Musculoskeletal: Normal range of motion.  Neurological: He is alert and oriented to person, place, and time.  Skin: Skin is warm and dry. No rash noted.  Psychiatric: He has a normal mood and affect. His behavior is normal.    ED Course  Procedures (including critical care time) Labs Review Labs Reviewed  URINALYSIS, ROUTINE W REFLEX MICROSCOPIC (NOT AT [facility]) - Abnormal; Notable for  the following:    Color, Urine ORANGE (*)    APPearance CLOUDY (*)    Hgb urine dipstick LARGE (*)    Bilirubin Urine SMALL (*)    Ketones, ur 40 (*)    Protein, ur 30 (*)    Nitrite POSITIVE (*)    Leukocytes, UA SMALL (*)    All other components within normal limits  CBC WITH DIFFERENTIAL/PLATELET - Abnormal; Notable for the following:    WBC 13.4 (*)    Hemoglobin 11.7 (*)    HCT 38.9 (*)    MCH 24.5 (*)    RDW 20.4 (*)    Platelets 561 (*)    Neutro Abs 11.7 (*)    All other components within normal limits  COMPREHENSIVE METABOLIC PANEL - Abnormal; Notable for the following:    Chloride 99 (*)    Glucose, Bld 126 (*)    Calcium 8.8 (*)    Albumin 2.6 (*)    ALT 9 (*)    Anion gap 19 (*)    All other components within normal limits  URINE MICROSCOPIC-ADD ON - Abnormal; Notable for the following:    Squamous Epithelial / LPF 0-5 (*)    Bacteria, UA FEW (*)    All other components within normal limits  I-STAT CG4 LACTIC ACID, ED - Abnormal; Notable for the following:    Lactic Acid, Venous 5.92 (*)    All other components within normal limits  I-STAT CG4 LACTIC ACID, ED - Abnormal; Notable for the following:    Lactic Acid, Venous 2.54 (*)    All other components within normal limits  CULTURE, BLOOD (ROUTINE X 2)  CULTURE, BLOOD (ROUTINE X 2)  URINE CULTURE  C DIFFICILE QUICK SCREEN W PCR REFLEX  CBC WITH DIFFERENTIAL/PLATELET  I-STAT CG4 LACTIC ACID, ED    Imaging Review Dg Chest 2 View  06/13/2015  CLINICAL DATA:  Increased weakness. EXAM: CHEST  2 VIEW COMPARISON:  October 26, 2014. FINDINGS: The heart size and mediastinal contours are within normal limits. No pneumothorax or pleural effusion is noted. Left lung is clear. Mild right basilar atelectasis or scarring is noted with associated nodular density. Probable old left rib fracture is noted. IMPRESSION: Mild right basilar atelectasis or scarring is noted with probable nodular density. CT scan of the chest is  recommended to rule out mass or neoplasm. Electronically Signed   By: Lupita Raider, M.D.   On: 06/13/2015 11:59   Dg Knee 1-2 Views Left  06/13/2015  CLINICAL DATA:  Patient with sepsis. Patient reports no pain. Recent history patellectomy. EXAM: LEFT KNEE - 1-2 VIEW COMPARISON:  Knee  radiograph 10/26/2014 FINDINGS: Interval removal of the patella. There is extensive swelling about the knee joint. Limited evaluation for cortical irregularity. Multiple new ossific densities are demonstrated within the overlying soft tissues. Markedly limited exam secondary to poor patient positioning as the patient would not cooperate with the radiographs. IMPRESSION: Marked soft tissue swelling about the knee concerning for an infectious process. Overall the exam is markedly limited secondary to difficulty with patient positioning as the patient did not cooperate with the examination. Recommend repeat evaluation when patient is clinically able. Multiple new calcific densities about the knee joint which for nonspecific may represent heterotopic calcification versus sequelae of infection. Electronically Signed   By: Annia Belt M.D.   On: 06/13/2015 14:44   Dg Knee 1-2 Views Right  06/13/2015  CLINICAL DATA:  Sepsis with unknown source EXAM: RIGHT KNEE - 1-2 VIEW COMPARISON:  October 26, 2014 FINDINGS: Frontal and lateral views were obtained. There is no acute fracture or dislocation. There is no appreciable knee joint effusion. There is calcification within the knee joint anteriorly and superiorly, possibly residua of old trauma or infection. The bones appear somewhat osteoporotic. No well-defined bony destruction is appreciable. There is slight narrowing medially and in the patellofemoral joint region. There are foci of arterial vascular calcification present. IMPRESSION: No well-defined bony destruction. Periarticular osteoporosis, particularly in the distal medial femoral condyle, could mask a degree of subtle osteomyelitis.  Calcification in the knee joint is probably due to residua of either old trauma or infection. There is no acute fracture or dislocation. No joint effusion evident. There are foci of atherosclerotic calcification. If there remains concern for potential osteomyelitis, either MRI or three-phase nuclear medicine bone scan potentially could be helpful for further assessment. Electronically Signed   By: Bretta Bang III M.D.   On: 06/13/2015 14:38   I have personally reviewed and evaluated these images and lab results as part of my medical decision-making.   EKG Interpretation   Date/Time:  Monday June 13 2015 10:46:18 EST Ventricular Rate:  122 PR Interval:  86 QRS Duration: 86 QT Interval:  329 QTC Calculation: 469 R Axis:   64 Text Interpretation:  Sinus tachycardia Abnormal R-wave progression, early  transition Confirmed by Fayrene Fearing  MD, Nettie Cromwell ([id number]) on 06/13/2015 11:12:42 AM      MDM   Final diagnoses:  Dehydration  UTI (lower urinary tract infection)  Diarrhea, unspecified type    Ultimately patient admitted. Clinically appears quite dehydrated, but BUN and creatinine do not reflect this. He has dry mucous membranes. Initially hypotensive and tachycardic. Low-grade fever 100.1. Cultures obtained. Does have urinary findings and symptoms plus blood and urine cultured. X-rays knee show probable chronic changes. Discussed with  hospitalist. Patient to be admitted    Rolland Porter, MD 06/13/15 [phone]

## 2015-06-13 NOTE — ED Notes (Signed)
Pt brought over by [facility] staff after Pt found to be tachycardic (130s) and hypotensive (80s).  Pt c/o diarrhea and increasing weakness x 2 days and chronic knee pain r/t wounds.  Pain score 5/10.  Extreme dry mouth noted.  Pt is a resident at [facility].

## 2015-06-14 LAB — COMPREHENSIVE METABOLIC PANEL
ALT: 11 U/L — ABNORMAL LOW (ref 17–63)
ANION GAP: 9 (ref 5–15)
AST: 27 U/L (ref 15–41)
Albumin: 2.2 g/dL — ABNORMAL LOW (ref 3.5–5.0)
Alkaline Phosphatase: 71 U/L (ref 38–126)
BUN: 11 mg/dL (ref 6–20)
CHLORIDE: 107 mmol/L (ref 101–111)
CO2: 25 mmol/L (ref 22–32)
Calcium: 8.5 mg/dL — ABNORMAL LOW (ref 8.9–10.3)
Creatinine, Ser: 0.6 mg/dL — ABNORMAL LOW (ref 0.61–1.24)
GFR calc Af Amer: >60 mL/min (ref >60)
Glucose, Bld: 80 mg/dL (ref 65–99)
POTASSIUM: 3.9 mmol/L (ref 3.5–5.1)
Sodium: 141 mmol/L (ref 135–145)
Total Bilirubin: 0.6 mg/dL (ref 0.3–1.2)
Total Protein: 5.9 g/dL — ABNORMAL LOW (ref 6.5–8.1)

## 2015-06-14 LAB — CBC
HCT: 34.7 % — ABNORMAL LOW (ref 39.0–52.0)
Hemoglobin: 10.8 g/dL — ABNORMAL LOW (ref 13.0–17.0)
MCH: 24.9 pg — ABNORMAL LOW (ref 26.0–34.0)
MCHC: 31.1 g/dL (ref 30.0–36.0)
MCV: 80.1 fL (ref 78.0–100.0)
PLATELETS: 473 10*3/uL — AB (ref 150–400)
RBC: 4.33 MIL/uL (ref 4.22–5.81)
RDW: 20.4 % — AB (ref 11.5–15.5)
WBC: 10.2 10*3/uL (ref 4.0–10.5)

## 2015-06-14 LAB — GASTROINTESTINAL PANEL BY PCR, STOOL (REPLACES STOOL CULTURE)

## 2015-06-14 LAB — URINE CULTURE

## 2015-06-14 LAB — LACTIC ACID, PLASMA: LACTIC ACID, VENOUS: 1.5 mmol/L (ref 0.5–2.0)

## 2015-06-14 LAB — INFLUENZA PANEL BY PCR (TYPE A & B)
H1N1FLUPCR: NOT DETECTED
INFLBPCR: NEGATIVE
Influenza A By PCR: NEGATIVE

## 2015-06-14 MED ORDER — COLLAGENASE 250 UNIT/GM EX OINT
TOPICAL_OINTMENT | Freq: Every day | CUTANEOUS | Status: DC
  Administered 2015-06-14 – 2015-06-15 (×2): 1 via TOPICAL
  Administered 2015-06-16 – 2015-06-21 (×4): via TOPICAL
  Filled 2015-06-14 (×2): qty 30

## 2015-06-14 MED ORDER — VITAMIN C 500 MG PO TABS
500.0000 mg | ORAL_TABLET | ORAL | Status: DC
  Administered 2015-06-14 – 2015-06-21 (×16): 500 mg via ORAL
  Filled 2015-06-14 (×16): qty 1

## 2015-06-14 MED ORDER — SODIUM CHLORIDE 0.9 % IV BOLUS (SEPSIS)
1000.0000 mL | Freq: Once | INTRAVENOUS | Status: AC
  Administered 2015-06-14: 1000 mL via INTRAVENOUS

## 2015-06-14 MED ORDER — RESOURCE 2.0 PO LIQD: 120.0000 mL | Freq: Three times a day (TID) | ORAL | Status: DC

## 2015-06-14 MED ORDER — VITAMINS A & D EX OINT
TOPICAL_OINTMENT | CUTANEOUS | Status: AC
  Administered 2015-06-14: 08:00:00
  Filled 2015-06-14: qty 5

## 2015-06-14 MED ORDER — TRAZODONE 25 MG HALF TABLET
25.0000 mg | ORAL_TABLET | Freq: Three times a day (TID) | ORAL | Status: DC
  Administered 2015-06-14 – 2015-06-21 (×23): 25 mg via ORAL
  Filled 2015-06-14 (×25): qty 1

## 2015-06-14 MED ORDER — BOOST / RESOURCE BREEZE PO LIQD
1.0000 | Freq: Three times a day (TID) | ORAL | Status: DC
  Administered 2015-06-14 – 2015-06-15 (×3): 1 via ORAL
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
## 2015-06-14 NOTE — Consult Note (Signed)
WOC wound consult note Reason for Consult:Bilateral patellar wounds, chronic, noon-healing with R>L.  Full thickness, Unstageable pressure injury to the right buttock, superior surface. Bilateral heels intact. Right ear with skin breakdown, Unstageable. Wound type:Pressure Pressure Ulcer POA: Yes Measurement:Right knee:  Open wound measuring 1.5cm x 1cm with depth unable to be determined due to the presence of necrotic tissue in a 12 x 6 area of healed (scar) tissue. Scant light yellow exudate. Left knee:  17cm x 3cm x 0.2cm area of partial thickness tissue loss in what was previously a 15cm x 12 cm full thickness wound.  Red wound bed, serous drainage in a small amount. Right upper buttock:  3xm x 3.5cm DTPI with soft, brown eschar.  Small amount of light brown exudate consistent with autolytically debriding necrotic tissue. Wound bed:As described above. Drainage (amount, consistency, odor) As described above. Periwound:Intact, dry.  Bilateral foot contractures with dry, flaking skin on feet.  Heels intact, we will float. Dressing procedure/placement/frequency:Patient is confused, but responsive and adamantly refuses turning and repositioning.  Is medicated for pain, finally agrees to turn and reposition for assessment. He is currently on a therapeutic mattress with low air loss feature (standard in the ICU) and I have ordered a continuation of this therapy upon transfer to the floor for pressure redistribution in the presence of a patient unwilling to turn and reposition without extensive persuasion.  I will implement a topical enzymatic debriding agent to the Unstageable right buttock ulcer, also the right knee and soft silicone foam to the left knee. Heels will be floated. The right ear will be moisturized to facilitate tissue recovery. WOC nursing team will not follow, but will remain available to this patient, the nursing and medical teams.  Please re-consult if needed. Thanks, Ladona Mow, MSN,  RN, GNP, [place]  Pager# [phone]

## 2015-06-14 NOTE — Progress Notes (Signed)
RN spoke with Timothy Norman at [facility], who is patients treatment nurse. Pt is normally alert and oriented at facility, but is "very demanding and refuses care." She gave RN phone numbers for cousin Timothy Norman, but they haven't been able to get in contact with him for a week after multiple phone calls. Only cousins girlfriend comes to visit patient. The last time they saw her was Friday, February 24.  Timothy Norman Contact Information Home: [phone] Cell: [phone]

## 2015-06-14 NOTE — Care Management Note (Signed)
Case Management Note  Patient Details  Name: JARREN PARA MRN: [id number] Date of Birth: [birth year]  Subjective/Objective:      hypotension              Action/Plan:Date: June 14, 2015 Chart reviewed for concurrent status and case management needs. Will continue to follow patient for changes and needs: Marcelle Smiling, BSN, RN, [place]   [phone]   Expected Discharge Date:   (unknown)               Expected Discharge Plan:  Skilled Nursing Facility  In-House Referral:  Clinical Social Work  Discharge planning Services  CM Consult  Post Acute Care Choice:  NA Choice offered to:  NA  DME Arranged:    DME Agency:     HH Arranged:    HH Agency:     Status of Service:  Completed, signed off  Medicare Important Message Given:    Date Medicare IM Given:    Medicare IM give by:    Date Additional Medicare IM Given:    Additional Medicare Important Message give by:     If discussed at Long Length of Stay Meetings, dates discussed:    Additional Comments:  Golda Acre, RN 06/14/2015, 10:44 AM

## 2015-06-14 NOTE — Clinical Social Work Note (Signed)
Clinical Social Work Assessment  Patient Details  Name: Timothy Norman MRN: [id number] Date of Birth: 06/13/1950  Date of referral:  06/14/15               Reason for consult:  Facility Placement, Discharge Planning                Permission sought to share information with:    Permission granted to share information::     Name::        Agency::     Relationship::     Contact Information:     Housing/Transportation Living arrangements for the past 2 months:  Skilled Building surveyor of Information:  Facility Patient Interpreter Needed:  None Criminal Activity/Legal Involvement Pertinent to Current Situation/Hospitalization:  No - Comment as needed Significant Relationships:  Other Family Members Lives with:  Facility Resident Do you feel safe going back to the place where you live?    Need for family participation in patient care:  Yes (Comment)  Care giving concerns:  No family at bedside.    Social Worker assessment / plan: Pt from [facility] [facility] admitted with Sepsis. CSW was unable to assist pt with d/c planning at this time ( pt sleeping soundly ). CSW attempted to contact pt's cousin, J. B. But was unsuccessful. CSW was unable to leave message for cousin. SNF contacted and d/c plan for pt to return to [facility] at d/c was confirmed. CSW will attempt to meet with pt tomorrow and will continue trying to make contact with pt's cousin to assist with d/c planning needs.  Employment status:  Retired Health and safety inspector:  Medicare PT Recommendations:  Not assessed at this time Information / Referral to community resources:     Patient/Family's Response to care:  No family at bedside or available by phone to determine response to care.   Patient/Family's Understanding of and Emotional Response to Diagnosis, Current Treatment, and Prognosis:  Unable to determine at this time. Pt sleeping / family unavailable.  Emotional Assessment Appearance:  Appears stated  age Attitude/Demeanor/Rapport:  Unable to Assess Affect (typically observed):  Unable to Assess Orientation:  Oriented to Self, Oriented to Place, Oriented to  Time, Oriented to Situation Alcohol / Substance use:  Not Applicable Psych involvement (Current and /or in the community):  No (Comment)  Discharge Needs  Concerns to be addressed:  Discharge Planning Concerns Readmission within the last 30 days:  No Current discharge risk:  None Barriers to Discharge:  No Barriers Identified   Vennie Homans   [phone] 06/14/2015, 3:19 PM

## 2015-06-14 NOTE — Progress Notes (Signed)
[facility] Progress Note   Timothy Norman  AVW:[id number]  DOB: 1950/12/19  DOA: 06/13/2015 PCP: Katy Apo, MD  Brief narrative: Timothy Norman is a 65 y.o. male with history of extensive bilateral knee injury secondary to an accident under care of Dr. Ulice Bold at the wound center. He is been living in a nursing facility and presents for hypotension and tachycardia. He is a poor historian and proper history is unobtainable. He did admit on admission to having diarrhea in 2 days of weakness. He was also noted to be having pain while urinating in the ER. UA was positive and it was suspected he may have a UTI. He was also found to have lactic acidosis.   Subjective: Timothy Norman. Difficult to get him focused on the conversation. Admits to having pain in both knees and while I am trying to look at his knees constantly telling me not to move his legs. He does admit to having diarrhea and states that he is been incontinent of stool. He has not had any nausea or vomiting or abdominal pain. No complaint of cough sore throat sinus drainage. No new rash.  Assessment/Plan: Principal Problem:   Sepsis (HCC) -Suspected to be secondary to UTI-no fever noted but temperature was 95.9 last night- remains lightly hypotensive but tachycardia has resolved  -h/o b/l septic knees- Xrays of bilateral knees reveals soft tissue swelling in the left knee "concerning for an infectious process"-there are also some concerns of possible osteomyelitis-I have asked orthopedics Dr. Roda Shutters to evaluate the patient as he performed surgery on this knee last year -per RN he has had 3 watery stools today- Due to diarrhea, CT scan of the abdomen and pelvis was ordered but patient is refusing this-not significantly tender in his abdomen on exam- C Diff this negative- I have ordered a GI pathogen panel - currently on Vanc and Cefepime- if blood cultures negative tomorrow, would consider narrowing antibiotics - mild  right basilar atelectasis noted on CXR- has a mild cough- follow  Active Problems:   Wounds, multiple - no external signs of infection- have ordered a wound care eval  ? Nodule in Right LL on CXR - CT recommended- it has been ordered but patient, again, refusing    Encephalopathy - not certain of his baseline  - not certain if he has an underlying psych diagnosis as he is on Depakote and Trazodone     Dehydration - cont NS - increase from 100 to 125 cc/hr due to diarrhea   Antibiotics: Anti-infectives    Start     Dose/Rate Route Frequency Ordered Stop   06/14/15 1600  vancomycin (VANCOCIN) IVPB 1000 mg/200 mL premix     1,000 mg 200 mL/hr over 60 Minutes Intravenous Every 24 hours 06/13/15 1745     06/13/15 2200  ceFEPIme (MAXIPIME) 1 g in dextrose 5 % 50 mL IVPB     1 g 100 mL/hr over 30 Minutes Intravenous 3 times per day 06/13/15 1743     06/13/15 1530  vancomycin (VANCOCIN) IVPB 1000 mg/200 mL premix     1,000 mg 200 mL/hr over 60 Minutes Intravenous  Once 06/13/15 1523 06/13/15 1715   06/13/15 1515  ceFEPIme (MAXIPIME) 2 g in dextrose 5 % 50 mL IVPB     2 g 100 mL/hr over 30 Minutes Intravenous  Once 06/13/15 1504 06/13/15 1551     Code Status:     Code Status Orders        Start  Ordered   06/13/15 1725  Full code   Continuous     06/13/15 1725    Code Status History    Date Active Date Inactive Code Status Order ID Comments User Context   10/26/2014  5:12 PM 11/05/2014 10:30 PM Full Code [id number]  Beaulah Dinning, MD Inpatient     Family Communication:  Disposition Plan: follow in SDU today DVT prophylaxis: Lovenox Consultants: Dr Roda Shutters- ortho Procedures:     Objective: Filed Weights   06/13/15 1719  Weight: 49.7 kg (109 lb 9.1 oz)    Intake/Output Summary (Last 24 hours) at 06/14/15 1652 Last data filed at 06/14/15 1300  Gross per 24 hour  Intake   2913 ml  Output    375 ml  Net   2538 ml     Vitals Filed Vitals:   06/14/15 1000  06/14/15 1100 06/14/15 1200 06/14/15 1337  BP: 132/65 99/55 87/52  99/57  Pulse: 84 96 106   Temp:   98.1 F (36.7 C)   TempSrc:      Resp: 13 16 21 10   Height:      Weight:      SpO2: 100% 98% 99% 99%    Exam:  General:  Pt is alert, not in acute distress  HEENT: No icterus, No thrush, oral mucosa moist  Cardiovascular: regular rate and rhythm, S1/S2 No murmur  Respiratory: clear to auscultation bilaterally   Abdomen: Soft, +Bowel sounds, non tender, non distended, no guarding  MSK: No cyanosis or clubbing- no pedal edema   Data Reviewed: Basic Metabolic Panel:  Recent Labs Lab 06/13/15 1259 06/14/15 0304  NA 141 141  K 4.0 3.9  CL 99* 107  CO2 23 25  GLUCOSE 126* 80  BUN 14 11  CREATININE 0.88 0.60*  CALCIUM 8.8* 8.5*   Liver Function Tests:  Recent Labs Lab 06/13/15 1259 06/14/15 0304  AST 27 27  ALT 9* 11*  ALKPHOS 89 71  BILITOT 1.2 0.6  PROT 7.0 5.9*  ALBUMIN 2.6* 2.2*   No results for input(s): LIPASE, AMYLASE in the last 168 hours. No results for input(s): AMMONIA in the last 168 hours. CBC:  Recent Labs Lab 06/13/15 1259 06/14/15 0304  WBC 13.4* 10.2  NEUTROABS 11.7*  --   HGB 11.7* 10.8*  HCT 38.9* 34.7*  MCV 81.6 80.1  PLT 561* 473*   Cardiac Enzymes: No results for input(s): CKTOTAL, CKMB, CKMBINDEX, TROPONINI in the last 168 hours. BNP (last 3 results) No results for input(s): BNP in the last 8760 hours.  ProBNP (last 3 results) No results for input(s): PROBNP in the last 8760 hours.  CBG: No results for input(s): GLUCAP in the last 168 hours.  Recent Results (from the past 240 hour(s))  Culture, blood (routine x 2)     Status: None (Preliminary result)   Collection Time: 06/13/15 11:07 AM  Result Value Ref Range Status   Specimen Description BLOOD LEFT ANTECUBITAL  Final   Special Requests IN PEDIATRIC BOTTLE  Final   Culture   Final    NO GROWTH 1 DAY Performed at [facility]    Report Status PENDING   Incomplete  Culture, blood (routine x 2)     Status: None (Preliminary result)   Collection Time: 06/13/15 11:08 AM  Result Value Ref Range Status   Specimen Description BLOOD LEFT HAND  Final   Special Requests BOTTLES DRAWN AEROBIC AND ANAEROBIC  Final   Culture   Final  NO GROWTH 1 DAY Performed at [facility]    Report Status PENDING  Incomplete  Urine culture     Status: None   Collection Time: 06/13/15 12:55 PM  Result Value Ref Range Status   Specimen Description URINE, CLEAN CATCH  Final   Special Requests NONE  Final   Culture   Final    MULTIPLE SPECIES PRESENT, SUGGEST RECOLLECTION Performed at [facility]    Report Status 06/14/2015 FINAL  Final  C difficile quick scan w PCR reflex     Status: None   Collection Time: 06/13/15  4:26 PM  Result Value Ref Range Status   C Diff antigen NEGATIVE NEGATIVE Final   C Diff toxin NEGATIVE NEGATIVE Final   C Diff interpretation Negative for toxigenic C. difficile  Final  MRSA PCR Screening     Status: None   Collection Time: 06/13/15  5:46 PM  Result Value Ref Range Status   MRSA by PCR NEGATIVE NEGATIVE Final    Comment:        The GeneXpert MRSA Assay (FDA approved for NASAL specimens only), is one component of a comprehensive MRSA colonization surveillance program. It is not intended to diagnose MRSA infection nor to guide or monitor treatment for MRSA infections.      Studies: Dg Chest 2 View  06/13/2015  CLINICAL DATA:  Increased weakness. EXAM: CHEST  2 VIEW COMPARISON:  October 26, 2014. FINDINGS: The heart size and mediastinal contours are within normal limits. No pneumothorax or pleural effusion is noted. Left lung is clear. Mild right basilar atelectasis or scarring is noted with associated nodular density. Probable old left rib fracture is noted. IMPRESSION: Mild right basilar atelectasis or scarring is noted with probable nodular density. CT scan of the chest is recommended to rule out  mass or neoplasm. Electronically Signed   By: Lupita Raider, M.D.   On: 06/13/2015 11:59   Dg Knee 1-2 Views Left  06/13/2015  CLINICAL DATA:  Patient with sepsis. Patient reports no pain. Recent history patellectomy. EXAM: LEFT KNEE - 1-2 VIEW COMPARISON:  Knee radiograph 10/26/2014 FINDINGS: Interval removal of the patella. There is extensive swelling about the knee joint. Limited evaluation for cortical irregularity. Multiple new ossific densities are demonstrated within the overlying soft tissues. Markedly limited exam secondary to poor patient positioning as the patient would not cooperate with the radiographs. IMPRESSION: Marked soft tissue swelling about the knee concerning for an infectious process. Overall the exam is markedly limited secondary to difficulty with patient positioning as the patient did not cooperate with the examination. Recommend repeat evaluation when patient is clinically able. Multiple new calcific densities about the knee joint which for nonspecific may represent heterotopic calcification versus sequelae of infection. Electronically Signed   By: Annia Belt M.D.   On: 06/13/2015 14:44   Dg Knee 1-2 Views Right  06/13/2015  CLINICAL DATA:  Sepsis with unknown source EXAM: RIGHT KNEE - 1-2 VIEW COMPARISON:  October 26, 2014 FINDINGS: Frontal and lateral views were obtained. There is no acute fracture or dislocation. There is no appreciable knee joint effusion. There is calcification within the knee joint anteriorly and superiorly, possibly residua of old trauma or infection. The bones appear somewhat osteoporotic. No well-defined bony destruction is appreciable. There is slight narrowing medially and in the patellofemoral joint region. There are foci of arterial vascular calcification present. IMPRESSION: No well-defined bony destruction. Periarticular osteoporosis, particularly in the distal medial femoral condyle, could mask a degree  of subtle osteomyelitis. Calcification in the  knee joint is probably due to residua of either old trauma or infection. There is no acute fracture or dislocation. No joint effusion evident. There are foci of atherosclerotic calcification. If there remains concern for potential osteomyelitis, either MRI or three-phase nuclear medicine bone scan potentially could be helpful for further assessment. Electronically Signed   By: Bretta Bang III M.D.   On: 06/13/2015 14:38    Scheduled Meds:  Scheduled Meds: . ceFEPime (MAXIPIME) IV  1 g Intravenous 3 times per day  . collagenase   Topical Daily  . divalproex  500 mg Oral BID  . enoxaparin (LOVENOX) injection  40 mg Subcutaneous Q24H  . feeding supplement  1 Container Oral TID BM  . ferrous sulfate  325 mg Oral BID  . folic acid  1 mg Oral Daily  . lactose free nutrition  120 mL Oral TID  . multivitamin with minerals  1 tablet Oral Daily  . pantoprazole  40 mg Oral QAC breakfast  . sodium chloride flush  3 mL Intravenous Q12H  . thiamine  100 mg Oral Daily  . traZODone  25 mg Oral TID  . vancomycin  1,000 mg Intravenous Q24H  . vitamin A & D      . vitamin C  500 mg Oral 2 times per day   Continuous Infusions: . sodium chloride 100 mL/hr at 06/14/15 0940    Time spent on care of this patient: 35 min   Shaquel Chavous, MD 06/14/2015, 4:52 PM  LOS: 1 day   [facility] Office  [phone] Pager - Text Page per [email or web address] If 7PM-7AM, please contact night-coverage [email or web address]

## 2015-06-14 NOTE — Consult Note (Signed)
ORTHOPAEDIC CONSULTATION  REQUESTING PHYSICIAN: Calvert Cantor, MD  Chief Complaint: bilateral leg pain  HPI: Timothy Norman is a 65 y.o. male who presents with sepsis, UTI, bilateral knee wounds being managed by Dr. Kelly Splinter of plastics.  Patient is poor historian and very argumentative and uncooperative.  Pain refuses to answer my questions.  Ortho consulted.  Past Medical History  Diagnosis Date  . ETOH abuse   . Muscle weakness   . Sepsis due to other specified Staphylococcus (HCC)   . Anemia   . Encephalopathy     from hospital discharge summary 11/05/14  . Polysubstance abuse     from hospital discharge summary 11/05/14.  . Septic arthritis of knee, left (HCC)     from hospital discharge summary 11/05/14.  Marland Kitchen Left rib fracture     from hospital discharge summary 11/05/14.  Marland Kitchen Staphylococcus aureus bacteremia     from hospital discharge summary 11/05/14.  Marland Kitchen Altered mental status     from hospital discharge summary 11/05/14.  . Wound abscess     from hospital discharge summary 11/05/14.  . Pseudomonas infection     from hospital discharge summary 11/05/14.  Marland Kitchen Poor dentition     from hospital discharge summary 11/05/14.  . Septic arthritis of knee, right (HCC)     from hospital discharge summary 11/05/14.  Marland Kitchen Acute renal insufficiency     from hospital discharge summary 11/05/14.  Marland Kitchen Ulcers of both lower extremities [facility])    Past Surgical History  Procedure Laterality Date  . Infected skin debridement Bilateral 10/26/2014  . I&d extremity Bilateral 10/26/2014    Procedure: IRRIGATION AND DEBRIDEMENT BILATERAL LEGS;  Surgeon: Tarry Kos, MD;  Location: [facility] OR;  Service: Orthopedics;  Laterality: Bilateral;  . I&d extremity Bilateral 10/29/2014    Procedure: IRRIGATION AND DEBRIDEMENT BILATERAL KNEES WITH VAC CHANGE;  Surgeon: Tarry Kos, MD;  Location: [facility] OR;  Service: Orthopedics;  Laterality: Bilateral;  . Tee without cardioversion N/A 11/03/2014    Procedure: TRANSESOPHAGEAL  ECHOCARDIOGRAM (TEE);  Surgeon: Wendall Stade, MD;  Location: [facility] ENDOSCOPY;  Service: Cardiovascular;  Laterality: N/A;  . I&d extremity Bilateral 01/19/2015    Procedure: IRRIGATION AND DEBRIDEMENT BILATERAL KNEES  WITH PLACEMENT A CELL on right knee and bilateral knee wound VACs. ;  Surgeon: Alena Bills Dillingham, DO;  Location: [facility] OR;  Service: Plastics;  Laterality: Bilateral;  . Patellectomy Left 01/21/2015    Procedure: PATELLECTOMY;  Surgeon: Tarry Kos, MD;  Location: [facility] OR;  Service: Orthopedics;  Laterality: Left;  irrigation and debridement left anterior knee with patellectomy   . Application of wound vac Left 01/21/2015    Procedure: APPLICATION OF WOUND VAC LEFT ANTERIOR KNEE;  Surgeon: Tarry Kos, MD;  Location: [facility] OR;  Service: Orthopedics;  Laterality: Left;  . I&d extremity Bilateral 03/17/2015    Procedure: IRRIGATION AND DEBRIDEMENT BILATERAL KNEE WITH PLACEMENT OF ACCELL AND VAC;  Surgeon: Alena Bills Dillingham, DO;  Location: [facility] OR;  Service: Plastics;  Laterality: Bilateral;  . I&d extremity Bilateral 03/07/2015    Procedure: IRRIGATION AND DEBRIDEMENT OF BILATARAL KNEES;  Surgeon: Alena Bills Dillingham, DO;  Location: [facility] OR;  Service: Plastics;  Laterality: Bilateral;  . Application of a-cell of extremity Bilateral 03/07/2015    Procedure: APPLICATION OF A-CELL and wound vac exchange to bilateral knees;  Surgeon: Alena Bills Dillingham, DO;  Location: [facility] OR;  Service: Plastics;  Laterality: Bilateral;   Social History   Social History  .  Marital Status: Legally Separated    Spouse Name: N/A  . Number of Children: N/A  . Years of Education: N/A   Social History Main Topics  . Smoking status: Former Smoker -- 0.25 packs/day    Types: Cigarettes    Quit date: 10/18/2014  . Smokeless tobacco: Never Used  . Alcohol Use: 1.2 oz/week    2 Cans of beer per week  . Drug Use: No  . Sexual Activity: Yes   Other Topics Concern  . None   Social History Narrative   History  reviewed. No pertinent family history. - negative except otherwise stated in the family history section No Known Allergies Prior to Admission medications   Medication Sig Start Date End Date Taking? Authorizing Provider  divalproex (DEPAKOTE) 500 MG DR tablet Take 500 mg by mouth 2 (two) times daily.   Yes Historical Provider, MD  ferrous sulfate 325 (65 FE) MG tablet Take 325 mg by mouth 2 (two) times daily. 10am and 10pm   Yes Historical Provider, MD  folic acid (FOLVITE) 1 MG tablet Take 1 tablet (1 mg total) by mouth daily. 11/05/14  Yes Beaulah Dinning, MD  lactose free nutrition (BOOST PLUS) LIQD Take 120 mLs by mouth 3 (three) times daily with meals. 9am, 11am, 6pm   Yes Historical Provider, MD  Multiple Vitamins-Minerals (CERTAVITE/ANTIOXIDANTS) TABS Take 1 tablet by mouth daily. For wound healing   Yes Historical Provider, MD  Nutritional Supplements (RESOURCE 2.0) LIQD Take 120 mLs by mouth 3 (three) times daily.   Yes Historical Provider, MD  oxyCODONE (OXY IR/ROXICODONE) 5 MG immediate release tablet Take 1 tablet (5 mg total) by mouth every 6 (six) hours. Patient taking differently: Take 5 mg by mouth every 6 (six) hours as needed for moderate pain or severe pain.  11/05/14  Yes Beaulah Dinning, MD  pantoprazole (PROTONIX) 40 MG tablet Take 40 mg by mouth daily before breakfast.   Yes Historical Provider, MD  thiamine 100 MG tablet Take 1 tablet (100 mg total) by mouth daily. 11/05/14  Yes Beaulah Dinning, MD  traZODone (DESYREL) 50 MG tablet Take 25 mg by mouth 3 (three) times daily.   Yes Historical Provider, MD  vitamin C (ASCORBIC ACID) 500 MG tablet Take 500 mg by mouth 2 (two) times daily. 10am and 6pm for wound healing   Yes Historical Provider, MD   Dg Chest 2 View  06/13/2015  CLINICAL DATA:  Increased weakness. EXAM: CHEST  2 VIEW COMPARISON:  October 26, 2014. FINDINGS: The heart size and mediastinal contours are within normal limits. No pneumothorax or pleural  effusion is noted. Left lung is clear. Mild right basilar atelectasis or scarring is noted with associated nodular density. Probable old left rib fracture is noted. IMPRESSION: Mild right basilar atelectasis or scarring is noted with probable nodular density. CT scan of the chest is recommended to rule out mass or neoplasm. Electronically Signed   By: Lupita Raider, M.D.   On: 06/13/2015 11:59   Dg Knee 1-2 Views Left  06/13/2015  CLINICAL DATA:  Patient with sepsis. Patient reports no pain. Recent history patellectomy. EXAM: LEFT KNEE - 1-2 VIEW COMPARISON:  Knee radiograph 10/26/2014 FINDINGS: Interval removal of the patella. There is extensive swelling about the knee joint. Limited evaluation for cortical irregularity. Multiple new ossific densities are demonstrated within the overlying soft tissues. Markedly limited exam secondary to poor patient positioning as the patient would not cooperate with the radiographs. IMPRESSION: Marked soft tissue  swelling about the knee concerning for an infectious process. Overall the exam is markedly limited secondary to difficulty with patient positioning as the patient did not cooperate with the examination. Recommend repeat evaluation when patient is clinically able. Multiple new calcific densities about the knee joint which for nonspecific may represent heterotopic calcification versus sequelae of infection. Electronically Signed   By: Annia Belt M.D.   On: 06/13/2015 14:44   Dg Knee 1-2 Views Right  06/13/2015  CLINICAL DATA:  Sepsis with unknown source EXAM: RIGHT KNEE - 1-2 VIEW COMPARISON:  October 26, 2014 FINDINGS: Frontal and lateral views were obtained. There is no acute fracture or dislocation. There is no appreciable knee joint effusion. There is calcification within the knee joint anteriorly and superiorly, possibly residua of old trauma or infection. The bones appear somewhat osteoporotic. No well-defined bony destruction is appreciable. There is slight  narrowing medially and in the patellofemoral joint region. There are foci of arterial vascular calcification present. IMPRESSION: No well-defined bony destruction. Periarticular osteoporosis, particularly in the distal medial femoral condyle, could mask a degree of subtle osteomyelitis. Calcification in the knee joint is probably due to residua of either old trauma or infection. There is no acute fracture or dislocation. No joint effusion evident. There are foci of atherosclerotic calcification. If there remains concern for potential osteomyelitis, either MRI or three-phase nuclear medicine bone scan potentially could be helpful for further assessment. Electronically Signed   By: Bretta Bang III M.D.   On: 06/13/2015 14:38   - pertinent xrays, CT, MRI studies were reviewed and independently interpreted  Positive ROS: All other systems have been reviewed and were otherwise negative with the exception of those mentioned in the HPI and as above.  Physical Exam: General: Argumentative and angry, NAD Cardiovascular: No pedal edema Respiratory: No cyanosis, no use of accessory musculature GI: No organomegaly, abdomen is soft and non-tender Skin: No lesions in the area of chief complaint Neurologic: Sensation intact distally Psychiatric: Patient is alert Lymphatic: No axillary or cervical lymphadenopathy  MUSCULOSKELETAL:  - bilateral knee wounds with good granulation tissue, no obvious signs of infection - I do not appreciate any effusions, cellulitis - patient refused the remainder of the exam  Assessment: Bilateral knee wounds Swelling seen on XR  Plan: - no obvious signs of deeper infection although MRI can be used to definitively r/o osteo - patient is very uncooperative and told me to leave - I explained to him the risks of refusing care - from my stand point, wounds appear to be healing fine - consider input from Dr. Kelly Splinter - would treat as UTI - ortho signed off  Thank you for  the consult and the opportunity to see Mr. Timothy Stanley, MD [facility] [phone] 4:54 PM

## 2015-06-15 ENCOUNTER — Inpatient Hospital Stay (HOSPITAL_COMMUNITY): Payer: Medicare Other

## 2015-06-15 DIAGNOSIS — G9340 Encephalopathy, unspecified: Secondary | ICD-10-CM

## 2015-06-15 DIAGNOSIS — I313 Pericardial effusion (noninflammatory): Secondary | ICD-10-CM

## 2015-06-15 DIAGNOSIS — I319 Disease of pericardium, unspecified: Secondary | ICD-10-CM

## 2015-06-15 DIAGNOSIS — A419 Sepsis, unspecified organism: Principal | ICD-10-CM

## 2015-06-15 DIAGNOSIS — R197 Diarrhea, unspecified: Secondary | ICD-10-CM

## 2015-06-15 DIAGNOSIS — I3139 Other pericardial effusion (noninflammatory): Secondary | ICD-10-CM

## 2015-06-15 MED ORDER — IOHEXOL 300 MG/ML  SOLN
100.0000 mL | Freq: Once | INTRAMUSCULAR | Status: AC | PRN
  Administered 2015-06-15: 100 mL via INTRAVENOUS

## 2015-06-15 MED ORDER — IOHEXOL 300 MG/ML  SOLN
25.0000 mL | INTRAMUSCULAR | Status: DC
  Administered 2015-06-15: 25 mL via ORAL

## 2015-06-15 MED ORDER — DOCUSATE SODIUM 100 MG PO CAPS
200.0000 mg | ORAL_CAPSULE | Freq: Every day | ORAL | Status: DC
  Administered 2015-06-15 – 2015-06-20 (×6): 200 mg via ORAL
  Filled 2015-06-15 (×8): qty 2

## 2015-06-15 MED ORDER — CETYLPYRIDINIUM CHLORIDE 0.05 % MT LIQD
7.0000 mL | Freq: Two times a day (BID) | OROMUCOSAL | Status: DC
  Administered 2015-06-15 – 2015-06-21 (×9): 7 mL via OROMUCOSAL

## 2015-06-15 NOTE — Progress Notes (Signed)
PROGRESS NOTE  Timothy Norman OEH:[id number] DOB: [birth year] DOA: 06/13/2015 PCP: [name], MD  Brief narrative: Timothy Norman is a 65 y.o. male with history of extensive bilateral knee injury secondary to an accident under care of Dr. [name] at the wound center. He is been living in a nursing facility and presents for hypotension and tachycardia. He is a poor historian and proper history is unobtainable. He did admit on admission to having diarrhea in 2 days of weakness. He was also noted to be having pain while urinating in the ER. UA was positive and it was suspected he may have a UTI. He was also found to have lactic acidosis.   Subjective: Rambling speech. Difficult to get him focused on the conversation. Admits to having pain in both knees and while I am trying to look at his knees constantly telling me not to move his legs. He does admit to having diarrhea and states that he is been incontinent of stool. He has not had any nausea or vomiting or abdominal pain. No complaint of cough sore throat sinus drainage. No new rash.  Assessment/Plan: Principal Problem:  Sepsis ([place]) -Suspected to be secondary to UTI-no fever noted but temperature was 95.9 on 06/13/15  -remains hypotensive with labile BPs -h/o b/l septic knees-  -Xrays of bilateral knees reveals soft tissue swelling in the left knee "concerning for an infectious process"-there are also some concerns of possible osteomyelitis -orthopedics Dr. [name] evaluated the patient as he performed surgery on his knee last year -per RN he has had 3 watery stools 2/28- Due to diarrhea,  -CT scan of the abdomen and pelvis was ordered but patient is refusing this initially-not significantly tender in his abdomen on exam -06/15/2015 CT abdomen pelvis negative for acute findings or bony abnormalities - currently on Vanc and Cefepime for now  -06/15/2015 CT chest negative for infiltrates or masses -ESR, CRP -may need MRI of  knees  Labile BP -am cortisol -?dysautonomia  Diarrhea -improved -C. Diff and GI pathogen panel neg  Wounds, multiple - no external signs of infection-  -Appreciate wound care evaluation  Pericardial effusion -Echocardiogram    Encephalopathy - not certain of his baseline  - not certain if he has an underlying psych diagnosis as he is on Depakote and Trazodone    Family Communication:   No family at beside Disposition Plan:  SNF > 3 days       Procedures/Studies: Dg Chest 2 View  06/13/2015  CLINICAL DATA:  Increased weakness. EXAM: CHEST  2 VIEW COMPARISON:  October 26, 2014. FINDINGS: The heart size and mediastinal contours are within normal limits. No pneumothorax or pleural effusion is noted. Left lung is clear. Mild right basilar atelectasis or scarring is noted with associated nodular density. Probable old left rib fracture is noted. IMPRESSION: Mild right basilar atelectasis or scarring is noted with probable nodular density. CT scan of the chest is recommended to rule out mass or neoplasm. Electronically Signed   By: Marijo Conception, M.D.   On: 06/13/2015 11:59   Dg Knee 1-2 Views Left  06/13/2015  CLINICAL DATA:  Patient with sepsis. Patient reports no pain. Recent history patellectomy. EXAM: LEFT KNEE - 1-2 VIEW COMPARISON:  Knee radiograph 10/26/2014 FINDINGS: Interval removal of the patella. There is extensive swelling about the knee joint. Limited evaluation for cortical irregularity. Multiple new ossific densities are demonstrated within the overlying soft tissues. Markedly limited exam  secondary to poor patient positioning as the patient would not cooperate with the radiographs. IMPRESSION: Marked soft tissue swelling about the knee concerning for an infectious process. Overall the exam is markedly limited secondary to difficulty with patient positioning as the patient did not cooperate with the examination. Recommend repeat evaluation when patient is clinically able.  Multiple new calcific densities about the knee joint which for nonspecific may represent heterotopic calcification versus sequelae of infection. Electronically Signed   By: Lovey Newcomer M.D.   On: 06/13/2015 14:44   Dg Knee 1-2 Views Right  06/13/2015  CLINICAL DATA:  Sepsis with unknown source EXAM: RIGHT KNEE - 1-2 VIEW COMPARISON:  October 26, 2014 FINDINGS: Frontal and lateral views were obtained. There is no acute fracture or dislocation. There is no appreciable knee joint effusion. There is calcification within the knee joint anteriorly and superiorly, possibly residua of old trauma or infection. The bones appear somewhat osteoporotic. No well-defined bony destruction is appreciable. There is slight narrowing medially and in the patellofemoral joint region. There are foci of arterial vascular calcification present. IMPRESSION: No well-defined bony destruction. Periarticular osteoporosis, particularly in the distal medial femoral condyle, could mask a degree of subtle osteomyelitis. Calcification in the knee joint is probably due to residua of either old trauma or infection. There is no acute fracture or dislocation. No joint effusion evident. There are foci of atherosclerotic calcification. If there remains concern for potential osteomyelitis, either MRI or three-phase nuclear medicine bone scan potentially could be helpful for further assessment. Electronically Signed   By: Lowella Grip III M.D.   On: 06/13/2015 14:38   Ct Chest W Contrast  06/15/2015  CLINICAL DATA:  Sepsis. Urinary tract infection. Hypotension. Initial encounter. EXAM: CT CHEST, ABDOMEN, AND PELVIS WITH CONTRAST TECHNIQUE: Multidetector CT imaging of the chest, abdomen and pelvis was performed following the standard protocol during bolus administration of intravenous contrast. CONTRAST:  100 mL OMNIPAQUE IOHEXOL 300 MG/ML  SOLN COMPARISON:  PA and lateral chest 06/13/2015. FINDINGS: CT CHEST The patient has small bilateral pleural  effusions. Moderate pericardial effusion is also seen. Heart size is normal. Calcific aortic and coronary atherosclerosis is identified. No axillary, hilar or mediastinal lymphadenopathy. Lungs demonstrate some linear atelectasis in the right lower lobe. There is also mild dependent atelectasis. The lungs are otherwise clear. No lytic or sclerotic bony lesion is seen. Remote lower left rib fractures are incidentally noted. CT ABDOMEN AND PELVIS The gallbladder, spleen, adrenal glands, kidneys, pancreas and biliary tree all appear normal. A 0.9 cm hypo attenuating lesion in the dome of the liver is compatible with a cyst. The liver is otherwise unremarkable. There is aortoiliac atherosclerosis without aneurysm. Body wall edema is noted. The prostate gland is mildly prominent. Urinary bladder and seminal vesicles are unremarkable. Prominent stool in the rectosigmoid colon is noted. The stomach and small bowel are unremarkable. There is no lymphadenopathy. Trace amount of free pelvic fluid is noted. No organized fluid collection. No focal bony abnormality. IMPRESSION: No source for the patient's infection is identified. Very small bilateral pleural effusions with a small to moderate pericardial effusion also seen. Calcific aortic and coronary atherosclerosis. Prominent stool burden in the rectosigmoid colon. Mild enlargement of prostate gland. Body wall edema and trace amount of free pelvic fluid compatible with anasarca. Electronically Signed   By: Inge Rise M.D.   On: 06/15/2015 13:45   Ct Abdomen Pelvis W Contrast  06/15/2015  CLINICAL DATA:  Sepsis. Urinary tract infection. Hypotension. Initial encounter. EXAM:  CT CHEST, ABDOMEN, AND PELVIS WITH CONTRAST TECHNIQUE: Multidetector CT imaging of the chest, abdomen and pelvis was performed following the standard protocol during bolus administration of intravenous contrast. CONTRAST:  100 mL OMNIPAQUE IOHEXOL 300 MG/ML  SOLN COMPARISON:  PA and lateral chest  06/13/2015. FINDINGS: CT CHEST The patient has small bilateral pleural effusions. Moderate pericardial effusion is also seen. Heart size is normal. Calcific aortic and coronary atherosclerosis is identified. No axillary, hilar or mediastinal lymphadenopathy. Lungs demonstrate some linear atelectasis in the right lower lobe. There is also mild dependent atelectasis. The lungs are otherwise clear. No lytic or sclerotic bony lesion is seen. Remote lower left rib fractures are incidentally noted. CT ABDOMEN AND PELVIS The gallbladder, spleen, adrenal glands, kidneys, pancreas and biliary tree all appear normal. A 0.9 cm hypo attenuating lesion in the dome of the liver is compatible with a cyst. The liver is otherwise unremarkable. There is aortoiliac atherosclerosis without aneurysm. Body wall edema is noted. The prostate gland is mildly prominent. Urinary bladder and seminal vesicles are unremarkable. Prominent stool in the rectosigmoid colon is noted. The stomach and small bowel are unremarkable. There is no lymphadenopathy. Trace amount of free pelvic fluid is noted. No organized fluid collection. No focal bony abnormality. IMPRESSION: No source for the patient's infection is identified. Very small bilateral pleural effusions with a small to moderate pericardial effusion also seen. Calcific aortic and coronary atherosclerosis. Prominent stool burden in the rectosigmoid colon. Mild enlargement of prostate gland. Body wall edema and trace amount of free pelvic fluid compatible with anasarca. Electronically Signed   By: Inge Rise M.D.   On: 06/15/2015 13:45         Subjective: Patient complains of pain in his bilateral legs. Denies headache, chest pain, short of breath, abdominal pain. No vomiting or diarrhea. Denies any fevers or chills.   Objective: Filed Vitals:   06/15/15 0800 06/15/15 1000 06/15/15 1006 06/15/15 1312  BP: 127/74 76/48 88/55 125/68  Pulse: 89 81 93 109  Temp: 97.6 F (36.4 C)      TempSrc: Oral     Resp: _0 Height:      Weight:      SpO2: 100% 96% 99% 100%    Intake/Output Summary (Last 24 hours) at 06/15/15 1426 Last data filed at 06/15/15 1400  Gross per 24 hour  Intake   1565 ml  Output   1125 ml  Net    440 ml   Weight change:  Exam:   General:  Pt is alert, follows commands appropriately, not in acute distress  HEENT: No icterus, No thrush, No neck mass, [place]/AT  Cardiovascular: RRR, S1/S2, no rubs, no gallops  Respiratory: Bibasilar crackles, right greater than left   Abdomen: Soft/+BS, non tender, non distended, no guarding  Extremities: 1+LE edema, No lymphangitis, No petechiae, No rashes, no synovitis; knees wounds with good granulation tissue--no necrosis or purulent drainage  Data Reviewed: Basic Metabolic Panel:  Recent Labs Lab 06/13/15 1259 06/14/15 0304  NA 141 141  K 4.0 3.9  CL 99* 107  CO2 23 25  GLUCOSE 126* 80  BUN 14 11  CREATININE 0.88 0.60*  CALCIUM 8.8* 8.5*   Liver Function Tests:  Recent Labs Lab 06/13/15 1259 06/14/15 0304  AST 27 27  ALT 9* 11*  ALKPHOS 89 71  BILITOT 1.2 0.6  PROT 7.0 5.9*  ALBUMIN 2.6* 2.2*   No results for input(s): LIPASE, AMYLASE in the last 168 hours. No  results for input(s): AMMONIA in the last 168 hours. CBC:  Recent Labs Lab 06/13/15 1259 06/14/15 0304  WBC 13.4* 10.2  NEUTROABS 11.7*  --   HGB 11.7* 10.8*  HCT 38.9* 34.7*  MCV 81.6 80.1  PLT 561* 473*   Cardiac Enzymes: No results for input(s): CKTOTAL, CKMB, CKMBINDEX, TROPONINI in the last 168 hours. BNP: Invalid input(s): POCBNP CBG: No results for input(s): GLUCAP in the last 168 hours.  Recent Results (from the past 240 hour(s))  Culture, blood (routine x 2)     Status: None (Preliminary result)   Collection Time: 06/13/15 11:07 AM  Result Value Ref Range Status   Specimen Description BLOOD LEFT ANTECUBITAL  Final   Special Requests IN PEDIATRIC BOTTLE 4ML  Final   Culture   Final     NO GROWTH 1 DAY Performed at [facility]    Report Status PENDING  Incomplete  Culture, blood (routine x 2)     Status: None (Preliminary result)   Collection Time: 06/13/15 11:08 AM  Result Value Ref Range Status   Specimen Description BLOOD LEFT HAND  Final   Special Requests BOTTLES DRAWN AEROBIC AND ANAEROBIC 3ML  Final   Culture   Final    NO GROWTH 1 DAY Performed at [facility]    Report Status PENDING  Incomplete  Urine culture     Status: None   Collection Time: 06/13/15 12:55 PM  Result Value Ref Range Status   Specimen Description URINE, CLEAN CATCH  Final   Special Requests NONE  Final   Culture   Final    MULTIPLE SPECIES PRESENT, SUGGEST RECOLLECTION Performed at [facility]    Report Status 06/14/2015 FINAL  Final  C difficile quick scan w PCR reflex     Status: None   Collection Time: 06/13/15  4:26 PM  Result Value Ref Range Status   C Diff antigen NEGATIVE NEGATIVE Final   C Diff toxin NEGATIVE NEGATIVE Final   C Diff interpretation Negative for toxigenic C. difficile  Final  MRSA PCR Screening     Status: None   Collection Time: 06/13/15  5:46 PM  Result Value Ref Range Status   MRSA by PCR NEGATIVE NEGATIVE Final    Comment:        The GeneXpert MRSA Assay (FDA approved for NASAL specimens only), is one component of a comprehensive MRSA colonization surveillance program. It is not intended to diagnose MRSA infection nor to guide or monitor treatment for MRSA infections.   Gastrointestinal Panel by PCR , Stool     Status: None   Collection Time: 06/14/15 11:50 AM  Result Value Ref Range Status   Campylobacter species NOT DETECTED NOT DETECTED Final   Plesimonas shigelloides NOT DETECTED NOT DETECTED Final   Salmonella species NOT DETECTED NOT DETECTED Final   Yersinia enterocolitica NOT DETECTED NOT DETECTED Final   Vibrio species NOT DETECTED NOT DETECTED Final   Vibrio cholerae NOT DETECTED NOT DETECTED Final    Enteroaggregative E coli (EAEC) NOT DETECTED NOT DETECTED Final   Enteropathogenic E coli (EPEC) NOT DETECTED NOT DETECTED Final   Enterotoxigenic E coli (ETEC) NOT DETECTED NOT DETECTED Final   Shiga like toxin producing E coli (STEC) NOT DETECTED NOT DETECTED Final   E. coli O157 NOT DETECTED NOT DETECTED Final   Shigella/Enteroinvasive E coli (EIEC) NOT DETECTED NOT DETECTED Final   Cryptosporidium NOT DETECTED NOT DETECTED Final   Cyclospora cayetanensis NOT DETECTED NOT DETECTED Final  Entamoeba histolytica NOT DETECTED NOT DETECTED Final   Giardia lamblia NOT DETECTED NOT DETECTED Final   Adenovirus F40/41 NOT DETECTED NOT DETECTED Final   Astrovirus NOT DETECTED NOT DETECTED Final   Norovirus GI/GII NOT DETECTED NOT DETECTED Final   Rotavirus A NOT DETECTED NOT DETECTED Final   Sapovirus (I, II, IV, and V) NOT DETECTED NOT DETECTED Final     Scheduled Meds: . antiseptic oral rinse  7 mL Mouth Rinse BID  . ceFEPime (MAXIPIME) IV  1 g Intravenous 3 times per day  . collagenase   Topical Daily  . divalproex  500 mg Oral BID  . enoxaparin (LOVENOX) injection  40 mg Subcutaneous Q24H  . feeding supplement  1 Container Oral TID BM  . ferrous sulfate  325 mg Oral BID  . folic acid  1 mg Oral Daily  . lactose free nutrition  120 mL Oral TID  . multivitamin with minerals  1 tablet Oral Daily  . pantoprazole  40 mg Oral QAC breakfast  . sodium chloride flush  3 mL Intravenous Q12H  . thiamine  100 mg Oral Daily  . traZODone  25 mg Oral TID  . vancomycin  1,000 mg Intravenous Q24H  . vitamin C  500 mg Oral 2 times per day   Continuous Infusions:    TAT, DAVID, DO  [facility] Pager [phone]  If 7PM-7AM, please contact night-coverage [email or web address] Password [email or web address] 06/15/2015, 2:26 PM   LOS: 2 days

## 2015-06-15 NOTE — Progress Notes (Signed)
  Echocardiogram 2D Echocardiogram has been performed.  Cathie Beams 06/15/2015, 4:50 PM

## 2015-06-16 ENCOUNTER — Inpatient Hospital Stay (HOSPITAL_COMMUNITY): Payer: Medicare Other

## 2015-06-16 DIAGNOSIS — N390 Urinary tract infection, site not specified: Secondary | ICD-10-CM

## 2015-06-16 DIAGNOSIS — I319 Disease of pericardium, unspecified: Secondary | ICD-10-CM

## 2015-06-16 DIAGNOSIS — L8990 Pressure ulcer of unspecified site, unspecified stage: Secondary | ICD-10-CM | POA: Insufficient documentation

## 2015-06-16 LAB — CBC
HCT: 35.5 % — ABNORMAL LOW (ref 39.0–52.0)
Hemoglobin: 11 g/dL — ABNORMAL LOW (ref 13.0–17.0)
MCH: 24.9 pg — AB (ref 26.0–34.0)
MCHC: 31 g/dL (ref 30.0–36.0)
MCV: 80.3 fL (ref 78.0–100.0)
PLATELETS: 357 10*3/uL (ref 150–400)
RBC: 4.42 MIL/uL (ref 4.22–5.81)
RDW: 20.5 % — AB (ref 11.5–15.5)
WBC: 7.4 10*3/uL (ref 4.0–10.5)

## 2015-06-16 LAB — BASIC METABOLIC PANEL
Anion gap: 9 (ref 5–15)
CALCIUM: 8.4 mg/dL — AB (ref 8.9–10.3)
CO2: 26 mmol/L (ref 22–32)
CREATININE: 0.5 mg/dL — AB (ref 0.61–1.24)
Chloride: 106 mmol/L (ref 101–111)
GFR calc non Af Amer: >60 mL/min (ref >60)
GLUCOSE: 80 mg/dL (ref 65–99)
Potassium: 3.6 mmol/L (ref 3.5–5.1)
Sodium: 141 mmol/L (ref 135–145)

## 2015-06-16 LAB — OCCULT BLOOD X 1 CARD TO LAB, STOOL: FECAL OCCULT BLD: POSITIVE — AB

## 2015-06-16 LAB — CORTISOL-AM, BLOOD: Cortisol - AM: 14.8 ug/dL (ref 6.7–22.6)

## 2015-06-16 LAB — C-REACTIVE PROTEIN: CRP: 5.2 mg/dL — AB (ref <1.0)

## 2015-06-16 LAB — SEDIMENTATION RATE: Sed Rate: 43 mm/hr — ABNORMAL HIGH (ref 0–16)

## 2015-06-16 MED ORDER — GADOBENATE DIMEGLUMINE 529 MG/ML IV SOLN
10.0000 mL | Freq: Once | INTRAVENOUS | Status: AC | PRN
  Administered 2015-06-16: 10 mL via INTRAVENOUS

## 2015-06-16 NOTE — Progress Notes (Signed)
Pharmacy Antibiotic Note  Timothy Norman is a 65 y.o. male admitted on 06/13/2015 with sepsis likely to UTI.  He continues on day#4 empiric Vancomycin and Cefepime per pharmacy dosing.  Today, 06/16/2015: Temp: afebrile WBC: wnl Renal: SCr 0.5; CrCl ~104ml/min (CG)  Plan:  D/C Vancomycin  Continue Cefepime 1g IV q8h  No further dose adjustments anticipated- pharmacy to sign off.  Please re-consult as needed.   Further de-escalation of antibiotics per cx data/as clinically appropriate   Height:  (188 cm) Weight: 109 lb 9.1 oz (49.7 kg) IBW/kg (Calculated) : 82.2  Temp (24hrs), Avg:98.1 F (36.7 C), Min:97.7 F (36.5 C), Max:98.4 F (36.9 C)   Recent Labs Lab 06/13/15 1119 06/13/15 1259 06/13/15 1500 06/13/15 1754 06/13/15 2101 06/14/15 0304 06/14/15 1729 06/16/15 0327  WBC  --  13.4*  --   --   --  10.2  --  7.4  CREATININE  --  0.88  --   --   --  0.60*  --  0.50*  LATICACIDVEN 5.92*  --  2.54* 2.4* 2.6*  --  1.5  --     Estimated Creatinine Clearance: 64.7 mL/min (by C-G formula based on Cr of 0.5).    No Known Allergies  Antimicrobials this admission: 2/27 vancomycin >> 3/2 2/27 cefepime >>  Dose adjustments this admission: ---  Microbiology results: 2/27 BCx: sent 2/27 UCx: sent  2/27 C diff: [place] Hx pseudomonas, MSSA in knee joint; MSSA, CoNS, and S bovis in blood from 10/2014  Thank you for allowing pharmacy to be a part of this patient's care.  Junita Push, PharmD, BCPS Pager: [phone] 06/16/2015, 11:02 AM

## 2015-06-16 NOTE — Progress Notes (Signed)
PROGRESS NOTE  Timothy Norman:[id number] DOB: 11-05-1950 DOA: 06/13/2015 PCP: [name], MD  Brief narrative: Timothy Norman is a 65 y.o. male with history of extensive bilateral knee injury secondary to an accident under care of Dr. [name] at the wound center. He is been living in a nursing facility and presents for hypotension and tachycardia. He is a poor historian and proper history is unobtainable. He did admit on admission to having diarrhea in 2 days of weakness. He was also noted to be having pain while urinating in the ER. UA had pyuria and nitrites, and it was suspected he may have a UTI. He was also found to have lactic acidosis.   Assessment/Plan:  Sepsis ([place]) -Suspected to be secondary to UTI?-no fever noted but temperature was 95.9 on 06/13/15  -blood cultures remain negative -h/o b/l septic knees-s/p debridement 10/26/14 with MSSA and Pseudomonas -Xrays of bilateral knees reveals soft tissue swelling in the left knee "concerning for an infectious process"-there are also some concerns of possible osteomyelitis -orthopedics Dr. [name] evaluated the patient as he performed surgery on his knee last year--pt refused to allow Dr. [name] to fully examine him -CT scan of the abdomen and pelvis was ordered but patient is refusing this initially -06/15/2015 CT abdomen pelvis negative for acute findings or bony abnormalities - currently on Vanc and Cefepime for now  -06/15/2015 CT chest negative for infiltrates or masses -ESR--43, CRP--5.2 -MRI of knees if pt able to tolerate--patient will need to be premedicated with morphine and transferred to the scanner with hard backboard -d/c vancomycin -continue cefepime  Labile BP -am cortisol--14.8 -?dysautonomia -BP overall improving over last 24 hours  Diarrhea -improved -C. Diff and GI pathogen panel neg  Wounds, multiple - no external signs of infection-  -Appreciate wound care evaluation -right gluteal  wound unstageable without purulent drainage or surrounding cellulitis  Pericardial effusion -06/15/15--Echocardiogram--moderate pericardial effusion with EF 81-84%, grade 1 diastolic dysfunction, no WMA; no hemodynamic compromise and -pt had trivial pericardial effusion on 10/30/14 -unclear reason for increase in pericardial effusion -consult cardiology  -admission EKG without ST changes   Encephalopathy - not certain of his baseline, but quite lucid on 0/3/75  - not certain if he has an underlying psych diagnosis as he is on Depakote and Trazodone    Family Communication: No family at beside Disposition Plan: SNF > 3 days   Procedures/Studies: Dg Chest 2 View  06/13/2015  CLINICAL DATA:  Increased weakness. EXAM: CHEST  2 VIEW COMPARISON:  October 26, 2014. FINDINGS: The heart size and mediastinal contours are within normal limits. No pneumothorax or pleural effusion is noted. Left lung is clear. Mild right basilar atelectasis or scarring is noted with associated nodular density. Probable old left rib fracture is noted. IMPRESSION: Mild right basilar atelectasis or scarring is noted with probable nodular density. CT scan of the chest is recommended to rule out mass or neoplasm. Electronically Signed   By: Marijo Conception, M.D.   On: 06/13/2015 11:59   Dg Knee 1-2 Views Left  06/13/2015  CLINICAL DATA:  Patient with sepsis. Patient reports no pain. Recent history patellectomy. EXAM: LEFT KNEE - 1-2 VIEW COMPARISON:  Knee radiograph 10/26/2014 FINDINGS: Interval removal of the patella. There is extensive swelling about the knee joint. Limited evaluation for cortical irregularity. Multiple new ossific densities are demonstrated within the overlying soft tissues. Markedly limited exam secondary to poor patient positioning as the patient  would not cooperate with the radiographs. IMPRESSION: Marked soft tissue swelling about the knee concerning for an infectious process. Overall the exam is  markedly limited secondary to difficulty with patient positioning as the patient did not cooperate with the examination. Recommend repeat evaluation when patient is clinically able. Multiple new calcific densities about the knee joint which for nonspecific may represent heterotopic calcification versus sequelae of infection. Electronically Signed   By: Lovey Newcomer M.D.   On: 06/13/2015 14:44   Dg Knee 1-2 Views Right  06/13/2015  CLINICAL DATA:  Sepsis with unknown source EXAM: RIGHT KNEE - 1-2 VIEW COMPARISON:  October 26, 2014 FINDINGS: Frontal and lateral views were obtained. There is no acute fracture or dislocation. There is no appreciable knee joint effusion. There is calcification within the knee joint anteriorly and superiorly, possibly residua of old trauma or infection. The bones appear somewhat osteoporotic. No well-defined bony destruction is appreciable. There is slight narrowing medially and in the patellofemoral joint region. There are foci of arterial vascular calcification present. IMPRESSION: No well-defined bony destruction. Periarticular osteoporosis, particularly in the distal medial femoral condyle, could mask a degree of subtle osteomyelitis. Calcification in the knee joint is probably due to residua of either old trauma or infection. There is no acute fracture or dislocation. No joint effusion evident. There are foci of atherosclerotic calcification. If there remains concern for potential osteomyelitis, either MRI or three-phase nuclear medicine bone scan potentially could be helpful for further assessment. Electronically Signed   By: Lowella Grip III M.D.   On: 06/13/2015 14:38   Ct Chest W Contrast  06/15/2015  CLINICAL DATA:  Sepsis. Urinary tract infection. Hypotension. Initial encounter. EXAM: CT CHEST, ABDOMEN, AND PELVIS WITH CONTRAST TECHNIQUE: Multidetector CT imaging of the chest, abdomen and pelvis was performed following the standard protocol during bolus administration of  intravenous contrast. CONTRAST:  100 mL OMNIPAQUE IOHEXOL 300 MG/ML  SOLN COMPARISON:  PA and lateral chest 06/13/2015. FINDINGS: CT CHEST The patient has small bilateral pleural effusions. Moderate pericardial effusion is also seen. Heart size is normal. Calcific aortic and coronary atherosclerosis is identified. No axillary, hilar or mediastinal lymphadenopathy. Lungs demonstrate some linear atelectasis in the right lower lobe. There is also mild dependent atelectasis. The lungs are otherwise clear. No lytic or sclerotic bony lesion is seen. Remote lower left rib fractures are incidentally noted. CT ABDOMEN AND PELVIS The gallbladder, spleen, adrenal glands, kidneys, pancreas and biliary tree all appear normal. A 0.9 cm hypo attenuating lesion in the dome of the liver is compatible with a cyst. The liver is otherwise unremarkable. There is aortoiliac atherosclerosis without aneurysm. Body wall edema is noted. The prostate gland is mildly prominent. Urinary bladder and seminal vesicles are unremarkable. Prominent stool in the rectosigmoid colon is noted. The stomach and small bowel are unremarkable. There is no lymphadenopathy. Trace amount of free pelvic fluid is noted. No organized fluid collection. No focal bony abnormality. IMPRESSION: No source for the patient's infection is identified. Very small bilateral pleural effusions with a small to moderate pericardial effusion also seen. Calcific aortic and coronary atherosclerosis. Prominent stool burden in the rectosigmoid colon. Mild enlargement of prostate gland. Body wall edema and trace amount of free pelvic fluid compatible with anasarca. Electronically Signed   By: Inge Rise M.D.   On: 06/15/2015 13:45   Ct Abdomen Pelvis W Contrast  06/15/2015  CLINICAL DATA:  Sepsis. Urinary tract infection. Hypotension. Initial encounter. EXAM: CT CHEST, ABDOMEN, AND PELVIS WITH CONTRAST TECHNIQUE:  Multidetector CT imaging of the chest, abdomen and pelvis was  performed following the standard protocol during bolus administration of intravenous contrast. CONTRAST:  100 mL OMNIPAQUE IOHEXOL 300 MG/ML  SOLN COMPARISON:  PA and lateral chest 06/13/2015. FINDINGS: CT CHEST The patient has small bilateral pleural effusions. Moderate pericardial effusion is also seen. Heart size is normal. Calcific aortic and coronary atherosclerosis is identified. No axillary, hilar or mediastinal lymphadenopathy. Lungs demonstrate some linear atelectasis in the right lower lobe. There is also mild dependent atelectasis. The lungs are otherwise clear. No lytic or sclerotic bony lesion is seen. Remote lower left rib fractures are incidentally noted. CT ABDOMEN AND PELVIS The gallbladder, spleen, adrenal glands, kidneys, pancreas and biliary tree all appear normal. A 0.9 cm hypo attenuating lesion in the dome of the liver is compatible with a cyst. The liver is otherwise unremarkable. There is aortoiliac atherosclerosis without aneurysm. Body wall edema is noted. The prostate gland is mildly prominent. Urinary bladder and seminal vesicles are unremarkable. Prominent stool in the rectosigmoid colon is noted. The stomach and small bowel are unremarkable. There is no lymphadenopathy. Trace amount of free pelvic fluid is noted. No organized fluid collection. No focal bony abnormality. IMPRESSION: No source for the patient's infection is identified. Very small bilateral pleural effusions with a small to moderate pericardial effusion also seen. Calcific aortic and coronary atherosclerosis. Prominent stool burden in the rectosigmoid colon. Mild enlargement of prostate gland. Body wall edema and trace amount of free pelvic fluid compatible with anasarca. Electronically Signed   By: Inge Rise M.D.   On: 06/15/2015 13:45         Subjective:  patient complains of bilateral knee pain and leg pain but improved over the last several days. Denies any fevers, chills, chest pain with varus breath,  vomiting, diarrhea, abdominal pain, headache, neck pain.  Objective: Filed Vitals:   06/16/15 0600 06/16/15 0800 06/16/15 0824 06/16/15 1200  BP: 130/69  122/67   Pulse: 80  83   Temp:  98.2 F (36.8 C)  98.2 F (36.8 C)  TempSrc:  Axillary  Oral  Resp: 13  17   Height:      Weight:      SpO2: 98%  99%     Intake/Output Summary (Last 24 hours) at 06/16/15 1415 Last data filed at 06/16/15 0620  Gross per 24 hour  Intake    350 ml  Output    600 ml  Net   -250 ml   Weight change:  Exam:   General:  Pt is alert, follows commands appropriately, not in acute distress  HEENT: No icterus, No thrush, No neck mass, [place]/AT  Cardiovascular: RRR, S1/S2, no rubs, no gallops  Respiratory:   Fine bibasilar crackles without wheezing.   Abdomen: Soft/+BS, non tender, non distended, no guarding  Extremities: 1+LE edema, No lymphangitis, No petechiae, No rashes, no synovitis; bilateral knee wounds without any purulent drainage. Good granulation tissue. No lymphangitis, necrosis, crepitance  Data Reviewed: Basic Metabolic Panel:  Recent Labs Lab 06/13/15 1259 06/14/15 0304 06/16/15 0327  NA 141 141 141  K 4.0 3.9 3.6  CL 99* 107 106  CO2 _0 GLUCOSE 126* 80 80  BUN 14 11 <5*  CREATININE 0.88 0.60* 0.50*  CALCIUM 8.8* 8.5* 8.4*   Liver Function Tests:  Recent Labs Lab 06/13/15 1259 06/14/15 0304  AST 27 27  ALT 9* 11*  ALKPHOS 89 71  BILITOT 1.2 0.6  PROT 7.0 5.9*  ALBUMIN 2.6* 2.2*   No results for input(s): LIPASE, AMYLASE in the last 168 hours. No results for input(s): AMMONIA in the last 168 hours. CBC:  Recent Labs Lab 06/13/15 1259 06/14/15 0304 06/16/15 0327  WBC 13.4* 10.2 7.4  NEUTROABS 11.7*  --   --   HGB 11.7* 10.8* 11.0*  HCT 38.9* 34.7* 35.5*  MCV 81.6 80.1 80.3  PLT 561* 473* 357   Cardiac Enzymes: No results for input(s): CKTOTAL, CKMB, CKMBINDEX, TROPONINI in the last 168 hours. BNP: Invalid input(s): POCBNP CBG: No results  for input(s): GLUCAP in the last 168 hours.  Recent Results (from the past 240 hour(s))  Culture, blood (routine x 2)     Status: None (Preliminary result)   Collection Time: 06/13/15 11:07 AM  Result Value Ref Range Status   Specimen Description BLOOD LEFT ANTECUBITAL  Final   Special Requests IN PEDIATRIC BOTTLE 4ML  Final   Culture   Final    NO GROWTH 3 DAYS Performed at [facility]    Report Status PENDING  Incomplete  Culture, blood (routine x 2)     Status: None (Preliminary result)   Collection Time: 06/13/15 11:08 AM  Result Value Ref Range Status   Specimen Description BLOOD LEFT HAND  Final   Special Requests BOTTLES DRAWN AEROBIC AND ANAEROBIC 3ML  Final   Culture   Final    NO GROWTH 3 DAYS Performed at [facility]    Report Status PENDING  Incomplete  Urine culture     Status: None   Collection Time: 06/13/15 12:55 PM  Result Value Ref Range Status   Specimen Description URINE, CLEAN CATCH  Final   Special Requests NONE  Final   Culture   Final    MULTIPLE SPECIES PRESENT, SUGGEST RECOLLECTION Performed at [facility]    Report Status 06/14/2015 FINAL  Final  C difficile quick scan w PCR reflex     Status: None   Collection Time: 06/13/15  4:26 PM  Result Value Ref Range Status   C Diff antigen NEGATIVE NEGATIVE Final   C Diff toxin NEGATIVE NEGATIVE Final   C Diff interpretation Negative for toxigenic C. difficile  Final  MRSA PCR Screening     Status: None   Collection Time: 06/13/15  5:46 PM  Result Value Ref Range Status   MRSA by PCR NEGATIVE NEGATIVE Final    Comment:        The GeneXpert MRSA Assay (FDA approved for NASAL specimens only), is one component of a comprehensive MRSA colonization surveillance program. It is not intended to diagnose MRSA infection nor to guide or monitor treatment for MRSA infections.   Gastrointestinal Panel by PCR , Stool     Status: None   Collection Time: 06/14/15 11:50 AM  Result  Value Ref Range Status   Campylobacter species NOT DETECTED NOT DETECTED Final   Plesimonas shigelloides NOT DETECTED NOT DETECTED Final   Salmonella species NOT DETECTED NOT DETECTED Final   Yersinia enterocolitica NOT DETECTED NOT DETECTED Final   Vibrio species NOT DETECTED NOT DETECTED Final   Vibrio cholerae NOT DETECTED NOT DETECTED Final   Enteroaggregative E coli (EAEC) NOT DETECTED NOT DETECTED Final   Enteropathogenic E coli (EPEC) NOT DETECTED NOT DETECTED Final   Enterotoxigenic E coli (ETEC) NOT DETECTED NOT DETECTED Final   Shiga like toxin producing E coli (STEC) NOT DETECTED NOT DETECTED Final   E. coli O157 NOT DETECTED NOT DETECTED Final  Shigella/Enteroinvasive E coli (EIEC) NOT DETECTED NOT DETECTED Final   Cryptosporidium NOT DETECTED NOT DETECTED Final   Cyclospora cayetanensis NOT DETECTED NOT DETECTED Final   Entamoeba histolytica NOT DETECTED NOT DETECTED Final   Giardia lamblia NOT DETECTED NOT DETECTED Final   Adenovirus F40/41 NOT DETECTED NOT DETECTED Final   Astrovirus NOT DETECTED NOT DETECTED Final   Norovirus GI/GII NOT DETECTED NOT DETECTED Final   Rotavirus A NOT DETECTED NOT DETECTED Final   Sapovirus (I, II, IV, and V) NOT DETECTED NOT DETECTED Final     Scheduled Meds: . antiseptic oral rinse  7 mL Mouth Rinse BID  . ceFEPime (MAXIPIME) IV  1 g Intravenous 3 times per day  . collagenase   Topical Daily  . divalproex  500 mg Oral BID  . docusate sodium  200 mg Oral QHS  . enoxaparin (LOVENOX) injection  40 mg Subcutaneous Q24H  . feeding supplement  1 Container Oral TID BM  . ferrous sulfate  325 mg Oral BID  . folic acid  1 mg Oral Daily  . lactose free nutrition  120 mL Oral TID  . multivitamin with minerals  1 tablet Oral Daily  . pantoprazole  40 mg Oral QAC breakfast  . sodium chloride flush  3 mL Intravenous Q12H  . thiamine  100 mg Oral Daily  . traZODone  25 mg Oral TID  . vitamin C  500 mg Oral 2 times per day   Continuous  Infusions:    Taniqua Issa, DO  [facility] Pager [phone]  If 7PM-7AM, please contact night-coverage [email or web address] Password [email or web address] 06/16/2015, 2:15 PM   LOS: 3 days

## 2015-06-16 NOTE — Consult Note (Addendum)
Admit date: 06/13/2015 Referring Physician  Dr. Arbutus Leas Primary Physician Katy Apo, MD Primary Cardiologist  New (prior TEE with Dr. Eden Emms 7/16) Reason for Consultation  worsening pericardial effusion  HPI: 65 year old with bilateral knee injury secondary to accident, wound center, living in nursing facility who presented with hypotension, tachycardia, diarrhea, 2 days of weakness, pain when urinating, lactic acidosis possible UTI with hypothermia 95.9 on 06/13/15 with negative blood cultures, negative TEE in 2016, ESR 43 with echocardiogram performed on 06/15/15 showing a moderate pericardial effusion with ejection fraction of 65-70% with no hemodynamic compromise. Previously patient had a trivial effusion 10/30/14. No ST segment changes are noted. Prior encephalopathy noted.  CT scan of chest from 06/15/15 personally viewed which demonstrates pericardial effusion, moderate mostly localized along the right ventricular free wall. Thankfully, echocardiogram does not demonstrate any evidence of hemodynamic compromise. A small/trivial pericardial effusion was noted on previous echocardiogram in July 2016.  Currently, blood pressure is 122/67, heart rate is 83. Prior TSH in July 2016 was normal. A.m. cortisol 14.8.  Main complaint is knee pain bilaterally, leg pain which seems to have gotten better. Denies any chest pain, no fevers   PMH:   Past Medical History  Diagnosis Date  . ETOH abuse   . Muscle weakness   . Sepsis due to other specified Staphylococcus (HCC)   . Anemia   . Encephalopathy     from hospital discharge summary 11/05/14  . Polysubstance abuse     from hospital discharge summary 11/05/14.  . Septic arthritis of knee, left (HCC)     from hospital discharge summary 11/05/14.  Marland Kitchen Left rib fracture     from hospital discharge summary 11/05/14.  Marland Kitchen Staphylococcus aureus bacteremia     from hospital discharge summary 11/05/14.  Marland Kitchen Altered mental status     from hospital discharge  summary 11/05/14.  . Wound abscess     from hospital discharge summary 11/05/14.  . Pseudomonas infection     from hospital discharge summary 11/05/14.  Marland Kitchen Poor dentition     from hospital discharge summary 11/05/14.  . Septic arthritis of knee, right (HCC)     from hospital discharge summary 11/05/14.  Marland Kitchen Acute renal insufficiency     from hospital discharge summary 11/05/14.  Marland Kitchen Ulcers of both lower extremities (HCC)     PSH:   Past Surgical History  Procedure Laterality Date  . Infected skin debridement Bilateral 10/26/2014  . I&d extremity Bilateral 10/26/2014    Procedure: IRRIGATION AND DEBRIDEMENT BILATERAL LEGS;  Surgeon: Tarry Kos, MD;  Location: [facility] OR;  Service: Orthopedics;  Laterality: Bilateral;  . I&d extremity Bilateral 10/29/2014    Procedure: IRRIGATION AND DEBRIDEMENT BILATERAL KNEES WITH VAC CHANGE;  Surgeon: Tarry Kos, MD;  Location: [facility] OR;  Service: Orthopedics;  Laterality: Bilateral;  . Tee without cardioversion N/A 11/03/2014    Procedure: TRANSESOPHAGEAL ECHOCARDIOGRAM (TEE);  Surgeon: Wendall Stade, MD;  Location: [facility] ENDOSCOPY;  Service: Cardiovascular;  Laterality: N/A;  . I&d extremity Bilateral 01/19/2015    Procedure: IRRIGATION AND DEBRIDEMENT BILATERAL KNEES  WITH PLACEMENT A CELL on right knee and bilateral knee wound VACs. ;  Surgeon: Alena Bills Dillingham, DO;  Location: [facility] OR;  Service: Plastics;  Laterality: Bilateral;  . Patellectomy Left 01/21/2015    Procedure: PATELLECTOMY;  Surgeon: Tarry Kos, MD;  Location: [facility] OR;  Service: Orthopedics;  Laterality: Left;  irrigation and debridement left anterior knee with patellectomy   . Application of wound  vac Left 01/21/2015    Procedure: APPLICATION OF WOUND VAC LEFT ANTERIOR KNEE;  Surgeon: Tarry Kos, MD;  Location: [facility] OR;  Service: Orthopedics;  Laterality: Left;  . I&d extremity Bilateral 03/17/2015    Procedure: IRRIGATION AND DEBRIDEMENT BILATERAL KNEE WITH PLACEMENT OF ACCELL AND VAC;  Surgeon: Alena Bills  Dillingham, DO;  Location: [facility] OR;  Service: Plastics;  Laterality: Bilateral;  . I&d extremity Bilateral 03/07/2015    Procedure: IRRIGATION AND DEBRIDEMENT OF BILATARAL KNEES;  Surgeon: Alena Bills Dillingham, DO;  Location: [facility] OR;  Service: Plastics;  Laterality: Bilateral;  . Application of a-cell of extremity Bilateral 03/07/2015    Procedure: APPLICATION OF A-CELL and wound vac exchange to bilateral knees;  Surgeon: Alena Bills Dillingham, DO;  Location: [facility] OR;  Service: Plastics;  Laterality: Bilateral;   Allergies:  Review of patient's allergies indicates no known allergies. Prior to Admit Meds:   Prior to Admission medications   Medication Sig Start Date End Date Taking? Authorizing Provider  divalproex (DEPAKOTE) 500 MG DR tablet Take 500 mg by mouth 2 (two) times daily.   Yes Historical Provider, MD  ferrous sulfate 325 (65 FE) MG tablet Take 325 mg by mouth 2 (two) times daily. 10am and 10pm   Yes Historical Provider, MD  folic acid (FOLVITE) 1 MG tablet Take 1 tablet (1 mg total) by mouth daily. 11/05/14  Yes Beaulah Dinning, MD  lactose free nutrition (BOOST PLUS) LIQD Take 120 mLs by mouth 3 (three) times daily with meals. 9am, 11am, 6pm   Yes Historical Provider, MD  Multiple Vitamins-Minerals (CERTAVITE/ANTIOXIDANTS) TABS Take 1 tablet by mouth daily. For wound healing   Yes Historical Provider, MD  Nutritional Supplements (RESOURCE 2.0) LIQD Take 120 mLs by mouth 3 (three) times daily.   Yes Historical Provider, MD  oxyCODONE (OXY IR/ROXICODONE) 5 MG immediate release tablet Take 1 tablet (5 mg total) by mouth every 6 (six) hours. Patient taking differently: Take 5 mg by mouth every 6 (six) hours as needed for moderate pain or severe pain.  11/05/14  Yes Beaulah Dinning, MD  pantoprazole (PROTONIX) 40 MG tablet Take 40 mg by mouth daily before breakfast.   Yes Historical Provider, MD  thiamine 100 MG tablet Take 1 tablet (100 mg total) by mouth daily. 11/05/14  Yes Beaulah Dinning, MD  traZODone (DESYREL) 50 MG tablet Take 25 mg by mouth 3 (three) times daily.   Yes Historical Provider, MD  vitamin C (ASCORBIC ACID) 500 MG tablet Take 500 mg by mouth 2 (two) times daily. 10am and 6pm for wound healing   Yes Historical Provider, MD   Fam HX:   Unable to obtain, patient uncooperative, no previously found early history of CAD.   Social HX:    Social History   Social History  . Marital Status: Legally Separated    Spouse Name: N/A  . Number of Children: N/A  . Years of Education: N/A   Occupational History  . Not on file.   Social History Main Topics  . Smoking status: Former Smoker -- 0.25 packs/day    Types: Cigarettes    Quit date: 10/18/2014  . Smokeless tobacco: Never Used  . Alcohol Use: 1.2 oz/week    2 Cans of beer per week  . Drug Use: No  . Sexual Activity: Yes   Other Topics Concern  . Not on file   Social History Narrative     ROS:  Difficult to obtain. All 11 ROS  were addressed and are negative except what is stated in the HPI   Physical Exam: Blood pressure 122/67, pulse 83, temperature 98.2 F (36.8 C), temperature source Oral, resp. rate 17, height '6\' 2"'$  (1.88 m), weight 109 lb 9.1 oz (49.7 kg), SpO2 99 %.   General: VERY Thin in no acute distress Head: Eyes PERRLA, No xanthomas.   Normal cephalic and atramatic  Lungs:   Clear bilaterally to auscultation and percussion. Normal respiratory effort. No wheezes, no rales. Heart:   HRRR S1 S2 Pulses are 2+ & equal. No murmur, rubs, gallops.  No carotid bruit. No JVD.  No abdominal bruits.  Abdomen: Bowel sounds are positive, abdomen soft and non-tender without masses. No hepatosplenomegaly. Msk:  Back normal. Normal strength and tone for age. Extremities:  No clubbing, cyanosis or edema.  DP +1 Neuro: Alert and oriented X 3, non-focal, MAE x 4 GU: Deferred Rectal: Deferred Psych:   uncooperative      Labs: Lab Results  Component Value Date   WBC 7.4 06/16/2015   HGB  11.0* 06/16/2015   HCT 35.5* 06/16/2015   MCV 80.3 06/16/2015   PLT 357 06/16/2015     Recent Labs Lab 06/14/15 0304 06/16/15 0327  NA 141 141  K 3.9 3.6  CL 107 106  CO2 25 26  BUN 11 <5*  CREATININE 0.60* 0.50*  CALCIUM 8.5* 8.4*  PROT 5.9*  --   BILITOT 0.6  --   ALKPHOS 71  --   ALT 11*  --   AST 27  --   GLUCOSE 80 80     Radiology:  Ct Chest W Contrast  06/15/2015  CLINICAL DATA:  Sepsis. Urinary tract infection. Hypotension. Initial encounter. EXAM: CT CHEST, ABDOMEN, AND PELVIS WITH CONTRAST TECHNIQUE: Multidetector CT imaging of the chest, abdomen and pelvis was performed following the standard protocol during bolus administration of intravenous contrast. CONTRAST:  100 mL OMNIPAQUE IOHEXOL 300 MG/ML  SOLN COMPARISON:  PA and lateral chest 06/13/2015. FINDINGS: CT CHEST The patient has small bilateral pleural effusions. Moderate pericardial effusion is also seen. Heart size is normal. Calcific aortic and coronary atherosclerosis is identified. No axillary, hilar or mediastinal lymphadenopathy. Lungs demonstrate some linear atelectasis in the right lower lobe. There is also mild dependent atelectasis. The lungs are otherwise clear. No lytic or sclerotic bony lesion is seen. Remote lower left rib fractures are incidentally noted. CT ABDOMEN AND PELVIS The gallbladder, spleen, adrenal glands, kidneys, pancreas and biliary tree all appear normal. A 0.9 cm hypo attenuating lesion in the dome of the liver is compatible with a cyst. The liver is otherwise unremarkable. There is aortoiliac atherosclerosis without aneurysm. Body wall edema is noted. The prostate gland is mildly prominent. Urinary bladder and seminal vesicles are unremarkable. Prominent stool in the rectosigmoid colon is noted. The stomach and small bowel are unremarkable. There is no lymphadenopathy. Trace amount of free pelvic fluid is noted. No organized fluid collection. No focal bony abnormality. IMPRESSION: No source  for the patient's infection is identified. Very small bilateral pleural effusions with a small to moderate pericardial effusion also seen. Calcific aortic and coronary atherosclerosis. Prominent stool burden in the rectosigmoid colon. Mild enlargement of prostate gland. Body wall edema and trace amount of free pelvic fluid compatible with anasarca. Electronically Signed   By: Inge Rise M.D.   On: 06/15/2015 13:45   Ct Abdomen Pelvis W Contrast  06/15/2015  CLINICAL DATA:  Sepsis. Urinary tract infection. Hypotension. Initial encounter. EXAM: CT  CHEST, ABDOMEN, AND PELVIS WITH CONTRAST TECHNIQUE: Multidetector CT imaging of the chest, abdomen and pelvis was performed following the standard protocol during bolus administration of intravenous contrast. CONTRAST:  100 mL OMNIPAQUE IOHEXOL 300 MG/ML  SOLN COMPARISON:  PA and lateral chest 06/13/2015. FINDINGS: CT CHEST The patient has small bilateral pleural effusions. Moderate pericardial effusion is also seen. Heart size is normal. Calcific aortic and coronary atherosclerosis is identified. No axillary, hilar or mediastinal lymphadenopathy. Lungs demonstrate some linear atelectasis in the right lower lobe. There is also mild dependent atelectasis. The lungs are otherwise clear. No lytic or sclerotic bony lesion is seen. Remote lower left rib fractures are incidentally noted. CT ABDOMEN AND PELVIS The gallbladder, spleen, adrenal glands, kidneys, pancreas and biliary tree all appear normal. A 0.9 cm hypo attenuating lesion in the dome of the liver is compatible with a cyst. The liver is otherwise unremarkable. There is aortoiliac atherosclerosis without aneurysm. Body wall edema is noted. The prostate gland is mildly prominent. Urinary bladder and seminal vesicles are unremarkable. Prominent stool in the rectosigmoid colon is noted. The stomach and small bowel are unremarkable. There is no lymphadenopathy. Trace amount of free pelvic fluid is noted. No  organized fluid collection. No focal bony abnormality. IMPRESSION: No source for the patient's infection is identified. Very small bilateral pleural effusions with a small to moderate pericardial effusion also seen. Calcific aortic and coronary atherosclerosis. Prominent stool burden in the rectosigmoid colon. Mild enlargement of prostate gland. Body wall edema and trace amount of free pelvic fluid compatible with anasarca. Electronically Signed   By: Inge Rise M.D.   On: 06/15/2015 13:45   Personally viewed.  EKG:  06/13/15-sinus tachycardia with no other abnormalities. No ST segment changes. Personally viewed.   ASSESSMENT/PLAN:    65 year old male with chronic bilateral leg wounds, UTI sepsis with small pleural effusions as well as moderate pericardial effusion with no evidence of tamponade.  Pericardial effusion - Continue to treat underlying illness. This is likely an inflammatory response. Effusion does not appear to be causing any signs of constriction or tamponade. There is no clinical indication for drainage nor is drainage of pericardial effusion of high yield for diagnostic purposes.  - TSH back in July was normal. We will repeat. - Consider administration of ibuprofen 600 mg 3 times a day for approximally 10 days to aid in the increasing inflammatory response. - Original EKG does not appear to have classic findings of pericarditis nor has he complained of chest discomfort.  Bilateral leg wounds - Per wound care team, primary team - Vancomycin, cefepime  Blood pressure has improved with treatment of septic condition. I do not believe that his hemodynamic qualities were secondary to paricardial tamponade.  We will follow along.  Candee Furbish, MD  06/16/2015  2:35 PM

## 2015-06-16 NOTE — Progress Notes (Signed)
Patient refuses the Boost.  Patient stated, "It makes me sick, and it makes me, (go to the bathroom, not stated in a polite term).

## 2015-06-17 ENCOUNTER — Inpatient Hospital Stay (HOSPITAL_COMMUNITY): Payer: Medicare Other

## 2015-06-17 DIAGNOSIS — T1490 Injury, unspecified: Secondary | ICD-10-CM

## 2015-06-17 MED ORDER — SODIUM CHLORIDE 0.9 % IV SOLN
INTRAVENOUS | Status: DC
  Administered 2015-06-17 – 2015-06-19 (×4): via INTRAVENOUS
  Filled 2015-06-17 (×12): qty 1000

## 2015-06-17 MED ORDER — DEXTROSE 5 % IV SOLN
1.0000 g | Freq: Three times a day (TID) | INTRAVENOUS | Status: DC
  Administered 2015-06-17 – 2015-06-19 (×6): 1 g via INTRAVENOUS
  Filled 2015-06-17 (×7): qty 1

## 2015-06-17 NOTE — Progress Notes (Signed)
Patient Name: Timothy DubinWarren D Rolston Date of Encounter: 06/17/2015     Principal Problem:   Sepsis [facility](HCC) Active Problems:   Wounds, multiple   Encephalopathy   Urinary tract infection   Dehydration   Diarrhea   Pericardial effusion   Pressure ulcer    SUBJECTIVE  No complaints at all. No chest pain or SOB.   CURRENT MEDS . antiseptic oral rinse  7 mL Mouth Rinse BID  . ceFEPime (MAXIPIME) IV  1 g Intravenous 3 times per day  . collagenase   Topical Daily  . divalproex  500 mg Oral BID  . docusate sodium  200 mg Oral QHS  . enoxaparin (LOVENOX) injection  40 mg Subcutaneous Q24H  . ferrous sulfate  325 mg Oral BID  . folic acid  1 mg Oral Daily  . multivitamin with minerals  1 tablet Oral Daily  . pantoprazole  40 mg Oral QAC breakfast  . sodium chloride flush  3 mL Intravenous Q12H  . thiamine  100 mg Oral Daily  . traZODone  25 mg Oral TID  . vitamin C  500 mg Oral 2 times per day    OBJECTIVE  Filed Vitals:   06/16/15 1600 06/16/15 1718 06/16/15 2014 06/17/15 0535  BP:  123/77 136/80 90/50  Pulse:  117 115 80  Temp: 98.2 F (36.8 C)  97.7 F (36.5 C) 98 F (36.7 C)  TempSrc:   Oral Oral  Resp:  16 17 17   Height:      Weight:      SpO2:  98% 98% 100%    Intake/Output Summary (Last 24 hours) at 06/17/15 1118 Last data filed at 06/16/15 1600  Gross per 24 hour  Intake     50 ml  Output      0 ml  Net     50 ml   Filed Weights   06/13/15 1719  Weight: 109 lb 9.1 oz (49.7 kg)    PHYSICAL EXAM  General: Pleasant, NAD. Thin, frail and chroniclaly ill appearing Neuro: Alert and oriented X 3. Moves all extremities spontaneously. Psych: Normal affect. HEENT:  Normal  Neck: Supple without bruits or JVD. Lungs:  Resp regular and unlabored, CTA. Heart: muffled heart sounds. RRR no s3, s4, or murmurs. Abdomen: Soft, non-tender, non-distended, BS + x 4.  Extremities: No clubbing, cyanosis or edema. DP/PT/Radials 2+ and equal bilaterally.  Accessory  Clinical Findings  CBC  Recent Labs  06/16/15 0327  WBC 7.4  HGB 11.0*  HCT 35.5*  MCV 80.3  PLT 357   Basic Metabolic Panel  Recent Labs  06/16/15 0327  NA 141  K 3.6  CL 106  CO2 26  GLUCOSE 80  BUN <5*  CREATININE 0.50*  CALCIUM 8.4*    TELE  Currently NSR HR 80s but earlier with sinus tachy with some PVCs and PACs  Radiology/Studies  Dg Chest 2 View  06/13/2015  CLINICAL DATA:  Increased weakness. EXAM: CHEST  2 VIEW COMPARISON:  October 26, 2014. FINDINGS: The heart size and mediastinal contours are within normal limits. No pneumothorax or pleural effusion is noted. Left lung is clear. Mild right basilar atelectasis or scarring is noted with associated nodular density. Probable old left rib fracture is noted. IMPRESSION: Mild right basilar atelectasis or scarring is noted with probable nodular density. CT scan of the chest is recommended to rule out mass or neoplasm. Electronically Signed   By: Lupita RaiderJames  Green Jr, M.D.   On: 06/13/2015 11:59  Dg Knee 1-2 Views Left  06/13/2015  CLINICAL DATA:  Patient with sepsis. Patient reports no pain. Recent history patellectomy. EXAM: LEFT KNEE - 1-2 VIEW COMPARISON:  Knee radiograph 10/26/2014 FINDINGS: Interval removal of the patella. There is extensive swelling about the knee joint. Limited evaluation for cortical irregularity. Multiple new ossific densities are demonstrated within the overlying soft tissues. Markedly limited exam secondary to poor patient positioning as the patient would not cooperate with the radiographs. IMPRESSION: Marked soft tissue swelling about the knee concerning for an infectious process. Overall the exam is markedly limited secondary to difficulty with patient positioning as the patient did not cooperate with the examination. Recommend repeat evaluation when patient is clinically able. Multiple new calcific densities about the knee joint which for nonspecific may represent heterotopic calcification versus  sequelae of infection. Electronically Signed   By: Annia Belt M.D.   On: 06/13/2015 14:44   Dg Knee 1-2 Views Right  06/13/2015  CLINICAL DATA:  Sepsis with unknown source EXAM: RIGHT KNEE - 1-2 VIEW COMPARISON:  October 26, 2014 FINDINGS: Frontal and lateral views were obtained. There is no acute fracture or dislocation. There is no appreciable knee joint effusion. There is calcification within the knee joint anteriorly and superiorly, possibly residua of old trauma or infection. The bones appear somewhat osteoporotic. No well-defined bony destruction is appreciable. There is slight narrowing medially and in the patellofemoral joint region. There are foci of arterial vascular calcification present. IMPRESSION: No well-defined bony destruction. Periarticular osteoporosis, particularly in the distal medial femoral condyle, could mask a degree of subtle osteomyelitis. Calcification in the knee joint is probably due to residua of either old trauma or infection. There is no acute fracture or dislocation. No joint effusion evident. There are foci of atherosclerotic calcification. If there remains concern for potential osteomyelitis, either MRI or three-phase nuclear medicine bone scan potentially could be helpful for further assessment. Electronically Signed   By: Bretta Bang III M.D.   On: 06/13/2015 14:38   Ct Chest W Contrast  06/15/2015  CLINICAL DATA:  Sepsis. Urinary tract infection. Hypotension. Initial encounter. EXAM: CT CHEST, ABDOMEN, AND PELVIS WITH CONTRAST TECHNIQUE: Multidetector CT imaging of the chest, abdomen and pelvis was performed following the standard protocol during bolus administration of intravenous contrast. CONTRAST:  100 mL OMNIPAQUE IOHEXOL 300 MG/ML  SOLN COMPARISON:  PA and lateral chest 06/13/2015. FINDINGS: CT CHEST The patient has small bilateral pleural effusions. Moderate pericardial effusion is also seen. Heart size is normal. Calcific aortic and coronary atherosclerosis is  identified. No axillary, hilar or mediastinal lymphadenopathy. Lungs demonstrate some linear atelectasis in the right lower lobe. There is also mild dependent atelectasis. The lungs are otherwise clear. No lytic or sclerotic bony lesion is seen. Remote lower left rib fractures are incidentally noted. CT ABDOMEN AND PELVIS The gallbladder, spleen, adrenal glands, kidneys, pancreas and biliary tree all appear normal. A 0.9 cm hypo attenuating lesion in the dome of the liver is compatible with a cyst. The liver is otherwise unremarkable. There is aortoiliac atherosclerosis without aneurysm. Body wall edema is noted. The prostate gland is mildly prominent. Urinary bladder and seminal vesicles are unremarkable. Prominent stool in the rectosigmoid colon is noted. The stomach and small bowel are unremarkable. There is no lymphadenopathy. Trace amount of free pelvic fluid is noted. No organized fluid collection. No focal bony abnormality. IMPRESSION: No source for the patient's infection is identified. Very small bilateral pleural effusions with a small to moderate pericardial effusion also seen.  Calcific aortic and coronary atherosclerosis. Prominent stool burden in the rectosigmoid colon. Mild enlargement of prostate gland. Body wall edema and trace amount of free pelvic fluid compatible with anasarca. Electronically Signed   By: Drusilla Kanner M.D.   On: 06/15/2015 13:45   Ct Abdomen Pelvis W Contrast  06/15/2015  CLINICAL DATA:  Sepsis. Urinary tract infection. Hypotension. Initial encounter. EXAM: CT CHEST, ABDOMEN, AND PELVIS WITH CONTRAST TECHNIQUE: Multidetector CT imaging of the chest, abdomen and pelvis was performed following the standard protocol during bolus administration of intravenous contrast. CONTRAST:  100 mL OMNIPAQUE IOHEXOL 300 MG/ML  SOLN COMPARISON:  PA and lateral chest 06/13/2015. FINDINGS: CT CHEST The patient has small bilateral pleural effusions. Moderate pericardial effusion is also seen.  Heart size is normal. Calcific aortic and coronary atherosclerosis is identified. No axillary, hilar or mediastinal lymphadenopathy. Lungs demonstrate some linear atelectasis in the right lower lobe. There is also mild dependent atelectasis. The lungs are otherwise clear. No lytic or sclerotic bony lesion is seen. Remote lower left rib fractures are incidentally noted. CT ABDOMEN AND PELVIS The gallbladder, spleen, adrenal glands, kidneys, pancreas and biliary tree all appear normal. A 0.9 cm hypo attenuating lesion in the dome of the liver is compatible with a cyst. The liver is otherwise unremarkable. There is aortoiliac atherosclerosis without aneurysm. Body wall edema is noted. The prostate gland is mildly prominent. Urinary bladder and seminal vesicles are unremarkable. Prominent stool in the rectosigmoid colon is noted. The stomach and small bowel are unremarkable. There is no lymphadenopathy. Trace amount of free pelvic fluid is noted. No organized fluid collection. No focal bony abnormality. IMPRESSION: No source for the patient's infection is identified. Very small bilateral pleural effusions with a small to moderate pericardial effusion also seen. Calcific aortic and coronary atherosclerosis. Prominent stool burden in the rectosigmoid colon. Mild enlargement of prostate gland. Body wall edema and trace amount of free pelvic fluid compatible with anasarca. Electronically Signed   By: Drusilla Kanner M.D.   On: 06/15/2015 13:45   Mr Knee Right W Wo Contrast  06/16/2015  CLINICAL DATA:  Knee pain, swelling, and cellulitis, especially anteriorly. EXAM: MRI OF THE RIGHT KNEE WITHOUT AND WITH CONTRAST TECHNIQUE: Multiplanar, multisequence MR imaging of the knee was performed before and after the administration of intravenous contrast. CONTRAST:  9 cc MultiHance COMPARISON:  06/13/2015 FINDINGS: Despite efforts by the technologist and patient, motion artifact is present on today's exam and could not be  eliminated. This reduces exam sensitivity and specificity. MENISCI Medial meniscus:  Unremarkable Lateral meniscus:  Unremarkable LIGAMENTS Cruciates:  Proximal PCL degeneration. Collaterals:  Mildly thickened MCL. CARTILAGE Patellofemoral: Suspected moderate chondral thinning and potential articular spurring along the medial patellar facet articular surface. Characterization problematic due to motion artifact. Medial: Suspected chondral thinning along the medial tibial plateau. Lateral:  Suspected mild chondral thinning. Joint: Small to moderate knee effusion with mild synovitis. Edema tracks in Hoffa's fat pad. Popliteal Fossa: There is considerable edema in the biceps femoris muscle, image 5 series 9. Edema also tracks in the anterior compartment of the upper calf, and there is diffuse subcutaneous edema around the knee especially posteriorly, laterally, and medially. Extensor Mechanism: Ossifications are present in the distal quadriceps tendon which also appears partially torn posteriorly in its vastus intermedius portion. Mild thickening of the patellar tendon. Suspected detachment of the medial patellar retinaculum from the medial patella, images 7 through 12 series 4. Poor definition of the lateral patellar retinaculum. Bones: There is abnormal edema  superiorly in the patella, image 18 series 7, primarily anteriorly in the vicinity of the quadriceps fibrocartilage continuation and adjacent bone. IMPRESSION: 1. Distal posterior quadriceps appears torn. This likely corresponds to the vastus intermedius portion of the tendon. There is also edema in the upper margin of the patella with only very faint associated enhancement, likely reactive rather than due to osteomyelitis. Ossifications noted in the distal quadriceps tendon based on conventional radiography. Thickened patellar tendon. 2. Diffuse subcutaneous edema, nonspecific but a component of this may reflect cellulitis. 3. Small to moderate knee effusion but  with only mild synovitis and without findings characteristic of septic joint. 4. Variable but mainly mild to moderate degrees of chondral thinning in the knee compartments. 5. Discontinuity medially in the medial patellar retinaculum compatible with tear. Indistinct lateral patellar retinaculum, nonspecific. 6. Despite efforts by the technologist and patient, motion artifact is present on today's exam and could not be eliminated. This reduces exam sensitivity and specificity. 7. Mildly thickened MCL could be a result of a prior injury. 8. Mild proximal PCL degeneration. Electronically Signed   By: Gaylyn Rong M.D.   On: 06/16/2015 19:42   Mr Knee Left W Wo Contrast  06/17/2015  CLINICAL DATA:  Cellulitis and swelling left knee pain. Prior patellar removal. Difficulty moving the lower extremity. EXAM: MRI OF THE LEFT KNEE WITHOUT AND WITH CONTRAST TECHNIQUE: Multiplanar, multisequence MR imaging of the knee was performed before and after the administration of intravenous contrast. CONTRAST:  10mL MULTIHANCE GADOBENATE DIMEGLUMINE 529 MG/ML IV SOLN COMPARISON:  06/13/2015 FINDINGS: MENISCI Medial meniscus: Diminutive, indistinct, and irregular posterior horn medially, likely torn but indistinct. Diminutive anterior horn, likely torn. Lateral meniscus: Very little in the way of recognizable meniscal tissue, with a thin band of tissue likely corresponding to the lateral meniscus noted along the periphery of the joint on most images. LIGAMENTS Cruciates: Abnormal slope and indistinctness of the ACL favoring ACL tear. PCL degeneration proximally. Collaterals: Highly indistinct proximal MCL, thickened and edematous, likely torn. Highly indistinct proximal fibular collateral ligament, at least a grade 2 sprain. Prominent tendinopathy or partial tearing of the proximal popliteus tendon. CARTILAGE Patellofemoral: The patella is absent. Femoral trochlear groove articular cartilage thin and indistinct. Medial: Severe  chondral loss is specially anteriorly in the joint. Diffuse subcortical edema. Lateral:  Markedly severe chondral loss.  Diffuse subcortical edema. Joint: Markedly severe synovitis in Hoffa's fat pad and along the expected location of the suprapatellar bursa. Suspected inferior plica or band of fibrosis extending from the patellectomy site towards the ACL. Prominent synovitis posteriorly in the knee joint. Popliteal Fossa: Diffuse edema in the subcutaneous tissues and tracking within along the proximal gastrocnemius musculature, popliteus musculature, and proximal soleus musculature. There is also low-level edema in the anterior compartment musculature in the lower leg. Extensor Mechanism: Indistinct connection of the quadriceps tendons to the patellectomy site, probably discontinuous. On images 17-18 there appears to be a fluid-filled potentially draining collection extending from the joint to the scan of the patellectomy site anteriorly. Severe surrounding synovitis. The thin residual patellar tendon appears to attach to several small bony ossicles but is not thought to be providing a by mechanically significant connection to the quadriceps tendons. The retinacular highly indistinct and likely discontinuous. Bones: Prominent edema and enhancement in both femoral condyles and throughout the tibial plateau. IMPRESSION: 1. Markedly severe synovitis with possible draining track extending from the joint to the anterior skin surface in the vicinity of the patellectomy. There is prominent edema and enhancement  in the femoral condyles and tibial plateau raising suspicion of a septic joint with osteomyelitis, although very little fluid to drain separate from the severe synovitis. 2. Highly indistinct quadriceps and proximal patellar tendon in the vicinity of the patellectomy, probably discontinuous and likely not by mechanically significant. The patellar retinacula likewise appear discontinuous/detached. 3. Abnormal ACL  slope, quite likely torn. 4. Severe diminution in size and indistinctness of the lateral meniscus, favoring high-grade tearing. Diminutive size of portions of the medial meniscus compatible with meniscal tears. 5. Highly indistinct, thickened, and edematous proximal MCL, likely torn. Grade 2 sprain of the fibular collateral ligament and partial tearing or tendinopathy of the proximal popliteus tendon. 6. Severe loss of articular cartilage in the medial and lateral compartments (especially the lateral compartment). Thinning of the femoral trochlear groove articular cartilage. 7. There is a band of fibrosis extending to a medial plica from the patellectomy site. 8. Diffuse subcutaneous edema favoring cellulitis. Electronically Signed   By: Gaylyn Rong M.D.   On: 06/17/2015 07:46    ASSESSMENT AND PLAN  65 year old male with chronic bilateral leg wounds, UTI sepsis with small pleural effusions as well as moderate pericardial effusion with no evidence of tamponade.  Pericardial effusion - Continue to treat underlying illness. This is likely an inflammatory response. Effusion does not appear to be causing any signs of constriction or tamponade. There is no clinical indication for drainage nor is drainage of pericardial effusion of high yield for diagnostic purposes.  - TSH back in July was normal. We will repeat ( this was never ordered  So i will get today) - Per Dr. Anne Fu, consider administration of ibuprofen 600 mg 3 times a day for approximally 10 days to aid in the decreasing inflammatory response. He has had no chest pain - Original EKG does not appear to have classic findings of pericarditis nor has he complained of chest discomfort.  Bilateral leg wounds - Per wound care team, primary team - Vancomycin, cefepime  Hypotension: felt to be due to sepsis and not features of tamponade. Had improved yesterday but now soft again. He does have labile BPs ? Dysautonomia   Signed, Janetta Hora PA-C  Pager [phone]  Personally seen and examined. Agree with above. No CP, no SOB Hypotension in relation to extremely thin body habitus as well as infection. See above for details Will sign off. Please call if ?Marland Kitchen
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
## 2015-06-17 NOTE — Progress Notes (Signed)
MRI of bilateral knees reviewed and does not represent any indications for orthopedic surgical intervention.  Ortho signed off.  Mayra ReelN. Michael Karson Chicas, MD [facility] [phone] 11:39 AM

## 2015-06-17 NOTE — Clinical Documentation Improvement (Signed)
Internal Medicine  Please clarify Nutritional status in progress notes and discharge summary?   Severe malnutrition  Other condition  Unable to clinically determine  Document any associated diagnoses/conditions   Supporting Information:   Per RD assessment 3/3:  DOCUMENTATION CODES:  Severe malnutrition in context of chronic illness NUTRITION DIAGNOSIS:  Malnutrition related to chronic illness as evidenced by severe depletion of body fat, severe depletion of muscle mass, percent weight loss. -ongoing MONITOR: PO intake, Weight trends, Labs, Skin, I & O's ASSESSMENT: No intakes documented since RD assessment on 2/28 at which time pt was NPO. Diet advanced to CLD 2/28 @ 1700 and to Soft diet 3/2 @ 1347. Pt very drowsy during visit and discussion this AM and provides very minimal information. Breakfast tray on bedside table is untouched. Pt states he was able to take a few bites of dinner last night but unable to obtain more detailed information on this from pt. Multiple notes indicate that pt refuses nutrition supplements that are ordered in addition to other supplements available here;  Height: 6'2" Weight  109lbs BMI 14.06  INTERVENTION:  - Will d/c Boost Breeze and Boost Plus per pt request - Will order snacks BID - RD will continue to monitor for needs  Please exercise your independent, professional judgment when responding. A specific answer is not anticipated or expected.   Thank You, Harless Littenebora T Morning [facility] [place] [phone]

## 2015-06-17 NOTE — [place] FL2 (Signed)
[place] MEDICAID FL2 LEVEL OF CARE SCREENING TOOL     IDENTIFICATION  Patient Name: Timothy Norman Birthdate: [birth year] Sex: male Admission Date (Current Location): 06/13/2015  [facility] and [place] Number:  Producer, television/film/video and Address:  [facility],  [address] [place] [address]      Provider Number: [id number]  Attending Physician Name and Address:  Catarina Hartshorn, MD  Relative Name and Phone Number:       Current Level of Care: Hospital Recommended Level of Care: Skilled Nursing Facility Prior Approval Number:    Date Approved/Denied:   PASRR Number: [id number]  Discharge Plan: SNF    Current Diagnoses: Patient Active Problem List   Diagnosis Date Noted  . Pressure ulcer 06/16/2015  . Pericardial effusion 06/15/2015  . Urinary tract infection 06/13/2015  . Sepsis (HCC) 06/13/2015  . Dehydration 06/13/2015  . Diarrhea 06/13/2015  . Pseudomonas infection   . Septic arthritis of knee, right (HCC) 10/29/2014  . Altered mental status   . Alcohol abuse 10/27/2014  . Acute renal insufficiency 10/27/2014  . Polysubstance abuse 10/27/2014  . Poor dentition 10/27/2014  . Left rib fracture 10/27/2014  . Septic arthritis of knee, left (HCC) 10/27/2014  . ETOH abuse 10/27/2014  . Wound abscess   . Encephalopathy   . Staphylococcus aureus bacteremia   . Acute blood loss anemia   . Wounds, multiple 10/26/2014    Orientation RESPIRATION BLADDER Height & Weight     Self, Time, Situation, Place  Normal Continent Weight: 49.7 kg (109 lb 9.1 oz) Height:   (188 cm)  BEHAVIORAL SYMPTOMS/MOOD NEUROLOGICAL BOWEL NUTRITION STATUS  Other (Comment) (Pt can be uncooperative with care at times.)   Incontinent Diet (Soft Diet)  AMBULATORY STATUS COMMUNICATION OF NEEDS Skin   Extensive Assist Verbally Other (Comment) (R/L knee wounds, stage 1 ( Ear) , stage 2 ( R hip ))                       Personal Care Assistance Level of Assistance   Bathing, Feeding, Dressing Bathing Assistance: Maximum assistance Feeding assistance: Limited assistance Dressing Assistance: Maximum assistance     Functional Limitations Info  Sight, Hearing, Speech Sight Info: Adequate Hearing Info: Adequate Speech Info: Adequate    SPECIAL CARE FACTORS FREQUENCY                       Contractures Contractures Info: Not present    Additional Factors Info  Code Status Code Status Info: Full Code             Current Medications (06/17/2015):  This is the current hospital active medication list Current Facility-Administered Medications  Medication Dose Route Frequency Provider Last Rate Last Dose  . acetaminophen (TYLENOL) tablet 650 mg  650 mg Oral Q6H PRN Osvaldo Shipper, MD   650 mg at 06/16/15 1019   Or  . acetaminophen (TYLENOL) suppository 650 mg  650 mg Rectal Q6H PRN Osvaldo Shipper, MD      . antiseptic oral rinse (CPC / CETYLPYRIDINIUM CHLORIDE 0.05%) solution 7 mL  7 mL Mouth Rinse BID Catarina Hartshorn, MD   7 mL at 06/17/15 1000  . collagenase (SANTYL) ointment   Topical Daily Calvert Cantor, MD      . divalproex (DEPAKOTE) DR tablet 500 mg  500 mg Oral BID Osvaldo Shipper, MD   500 mg at 06/17/15 1037  . docusate sodium (COLACE) capsule 200 mg  200 mg Oral QHS Leda GauzeKaren J Kirby-Graham, NP   200 mg at 06/16/15 2125  . enoxaparin (LOVENOX) injection 40 mg  40 mg Subcutaneous Q24H Osvaldo ShipperGokul Krishnan, MD   40 mg at 06/16/15 2125  . ferrous sulfate tablet 325 mg  325 mg Oral BID Osvaldo ShipperGokul Krishnan, MD   325 mg at 06/17/15 1036  . folic acid (FOLVITE) tablet 1 mg  1 mg Oral Daily Osvaldo ShipperGokul Krishnan, MD   1 mg at 06/17/15 1037  . morphine 2 MG/ML injection 2 mg  2 mg Intravenous Q3H PRN Osvaldo ShipperGokul Krishnan, MD   2 mg at 06/17/15 1306  . multivitamin with minerals tablet 1 tablet  1 tablet Oral Daily Osvaldo ShipperGokul Krishnan, MD   1 tablet at 06/17/15 1037  . ondansetron (ZOFRAN) tablet 4 mg  4 mg Oral Q6H PRN Osvaldo ShipperGokul Krishnan, MD       Or  . ondansetron (ZOFRAN) injection  4 mg  4 mg Intravenous Q6H PRN Osvaldo ShipperGokul Krishnan, MD      . oxyCODONE (Oxy IR/ROXICODONE) immediate release tablet 5 mg  5 mg Oral Q6H PRN Osvaldo ShipperGokul Krishnan, MD   5 mg at 06/16/15 2124  . pantoprazole (PROTONIX) EC tablet 40 mg  40 mg Oral QAC breakfast Osvaldo ShipperGokul Krishnan, MD   40 mg at 06/17/15 0759  . sodium chloride 0.9 % 1,000 mL with potassium chloride 20 mEq infusion   Intravenous Continuous Onalee Huaavid Tat, MD      . sodium chloride flush (NS) 0.9 % injection 3 mL  3 mL Intravenous Q12H Osvaldo ShipperGokul Krishnan, MD   3 mL at 06/16/15 2126  . thiamine (VITAMIN B-1) tablet 100 mg  100 mg Oral Daily Osvaldo ShipperGokul Krishnan, MD   100 mg at 06/17/15 1036  . traZODone (DESYREL) tablet 25 mg  25 mg Oral TID Calvert CantorSaima Rizwan, MD   25 mg at 06/17/15 1036  . vitamin C (ASCORBIC ACID) tablet 500 mg  500 mg Oral 2 times per day Calvert CantorSaima Rizwan, MD   500 mg at 06/17/15 1037     Discharge Medications: Please see discharge summary for a list of discharge medications.  Relevant Imaging Results:  Relevant Lab Results:   Additional Information SS # [id number]  Arvell Pulsifer, Dickey GaveJamie Lee, LCSW

## 2015-06-17 NOTE — Progress Notes (Signed)
Date:  June 17, 2015 Chart reviewed for concurrent status and case management needs. Will continue to follow patient for changes and needs: Alex Leahy, BSN, RN, CCM   [phone] 

## 2015-06-17 NOTE — Procedures (Signed)
Successful bedside swab of apparent sinus tract involving extending from the knee joint space to the anterior-medial soft tissues of the knee as was demonstrated on prior MRI. Swab tip sent to lab for analysis. Note, pt could not tolerate positioning on the fluoro table for attempted fluoro guided knee aspiration.  Katherina RightJay Ranon Coven, MD Pager #: [phone]

## 2015-06-17 NOTE — Progress Notes (Signed)
PROGRESS NOTE  Timothy Norman KMM:[id number] DOB: [birth year] DOA: 06/13/2015 PCP: [name], MD  Brief narrative: Timothy Norman is a 65 y.o. male with history of extensive bilateral knee injury secondary to an accident under care of Dr. [name] at the wound center. He is been living in a nursing facility and presents for hypotension and tachycardia. He is a poor historian and proper history is unobtainable. He did admit on admission to having diarrhea in 2 days of weakness. He was also noted to be having pain while urinating in the ER. UA had pyuria and nitrites, and it was suspected he may have a UTI. He was also found to have lactic acidosis.   Assessment/Plan: Sepsis ([place]) -Suspected to be secondary to UTI?-no fever noted but temperature was 95.9 on 06/13/15  -blood cultures remain negative -h/o b/l septic knees-s/p debridement 10/26/14 with MSSA and Pseudomonas -Xrays of bilateral knees reveals soft tissue swelling in the left knee "concerning for an infectious process"-there are also some concerns of possible osteomyelitis -orthopedics Dr. [name] evaluated the patient as he performed surgery on his knee last year--pt refused to allow Dr. [name] to fully examine him -CT scan of the abdomen and pelvis was ordered but patient is refusing this initially -06/15/2015 CT abdomen pelvis negative for acute findings or bony abnormalities - currently on Vanc and Cefepime for now  -06/15/2015 CT chest negative for infiltrates or masses -ESR--43, CRP--5.2 -MRI of knees if pt able to tolerate-L-knee with appearance of synovitis from the joint to the anterior skin surface with enhancement of the femoral condyle and tibial plateau concerning for osteomyelitis or septic joint -d/c vancomycin -06/17/15-case discussed with ortho, Dr. [name], who did not feel there was operative indication presently -06/17/15-case discussed with IR, Dr. [name] try to obtain fluid or swab tract -please send any  fluid for cell count   Labile BP -am cortisol--14.8 -?dysautonomia -BP overall improving, but still having periods of lability  Diarrhea -improved -C. Diff and GI pathogen panel neg  Wounds, multiple - no external signs of infection-  -Appreciate wound care evaluation -right gluteal wound unstageable without purulent drainage or surrounding cellulitis  Pericardial effusion -06/15/15--Echocardiogram--moderate pericardial effusion with EF 79-03%, grade 1 diastolic dysfunction, no WMA; no hemodynamic compromise and -pt had trivial pericardial effusion on 10/30/14 -unclear reason for increase in pericardial effusion -appreciate cardiology--no indication for invasive procedure presently -admission EKG without ST changes   Encephalopathy - not certain of his baseline, but quite lucid on 06/16/15  -appears to be back to baseline 11/17/31 - not certain if he has an underlying psych diagnosis as he is on Depakote and Trazodone    Family Communication: No family at beside Disposition Plan: SNF > 3 days Total time with patient 45 min, >50% spent counseling pt and coordinating care   Procedures/Studies: Dg Chest 2 View  06/13/2015  CLINICAL DATA:  Increased weakness. EXAM: CHEST  2 VIEW COMPARISON:  October 26, 2014. FINDINGS: The heart size and mediastinal contours are within normal limits. No pneumothorax or pleural effusion is noted. Left lung is clear. Mild right basilar atelectasis or scarring is noted with associated nodular density. Probable old left rib fracture is noted. IMPRESSION: Mild right basilar atelectasis or scarring is noted with probable nodular density. CT scan of the chest is recommended to rule out mass or neoplasm. Electronically Signed   By: Marijo Conception, M.D.   On: 06/13/2015 11:59   Dg Knee  1-2 Views Left  06/13/2015  CLINICAL DATA:  Patient with sepsis. Patient reports no pain. Recent history patellectomy. EXAM: LEFT KNEE - 1-2 VIEW COMPARISON:  Knee radiograph  10/26/2014 FINDINGS: Interval removal of the patella. There is extensive swelling about the knee joint. Limited evaluation for cortical irregularity. Multiple new ossific densities are demonstrated within the overlying soft tissues. Markedly limited exam secondary to poor patient positioning as the patient would not cooperate with the radiographs. IMPRESSION: Marked soft tissue swelling about the knee concerning for an infectious process. Overall the exam is markedly limited secondary to difficulty with patient positioning as the patient did not cooperate with the examination. Recommend repeat evaluation when patient is clinically able. Multiple new calcific densities about the knee joint which for nonspecific may represent heterotopic calcification versus sequelae of infection. Electronically Signed   By: Lovey Newcomer M.D.   On: 06/13/2015 14:44   Dg Knee 1-2 Views Right  06/13/2015  CLINICAL DATA:  Sepsis with unknown source EXAM: RIGHT KNEE - 1-2 VIEW COMPARISON:  October 26, 2014 FINDINGS: Frontal and lateral views were obtained. There is no acute fracture or dislocation. There is no appreciable knee joint effusion. There is calcification within the knee joint anteriorly and superiorly, possibly residua of old trauma or infection. The bones appear somewhat osteoporotic. No well-defined bony destruction is appreciable. There is slight narrowing medially and in the patellofemoral joint region. There are foci of arterial vascular calcification present. IMPRESSION: No well-defined bony destruction. Periarticular osteoporosis, particularly in the distal medial femoral condyle, could mask a degree of subtle osteomyelitis. Calcification in the knee joint is probably due to residua of either old trauma or infection. There is no acute fracture or dislocation. No joint effusion evident. There are foci of atherosclerotic calcification. If there remains concern for potential osteomyelitis, either MRI or three-phase nuclear  medicine bone scan potentially could be helpful for further assessment. Electronically Signed   By: Lowella Grip III M.D.   On: 06/13/2015 14:38   Ct Chest W Contrast  06/15/2015  CLINICAL DATA:  Sepsis. Urinary tract infection. Hypotension. Initial encounter. EXAM: CT CHEST, ABDOMEN, AND PELVIS WITH CONTRAST TECHNIQUE: Multidetector CT imaging of the chest, abdomen and pelvis was performed following the standard protocol during bolus administration of intravenous contrast. CONTRAST:  100 mL OMNIPAQUE IOHEXOL 300 MG/ML  SOLN COMPARISON:  PA and lateral chest 06/13/2015. FINDINGS: CT CHEST The patient has small bilateral pleural effusions. Moderate pericardial effusion is also seen. Heart size is normal. Calcific aortic and coronary atherosclerosis is identified. No axillary, hilar or mediastinal lymphadenopathy. Lungs demonstrate some linear atelectasis in the right lower lobe. There is also mild dependent atelectasis. The lungs are otherwise clear. No lytic or sclerotic bony lesion is seen. Remote lower left rib fractures are incidentally noted. CT ABDOMEN AND PELVIS The gallbladder, spleen, adrenal glands, kidneys, pancreas and biliary tree all appear normal. A 0.9 cm hypo attenuating lesion in the dome of the liver is compatible with a cyst. The liver is otherwise unremarkable. There is aortoiliac atherosclerosis without aneurysm. Body wall edema is noted. The prostate gland is mildly prominent. Urinary bladder and seminal vesicles are unremarkable. Prominent stool in the rectosigmoid colon is noted. The stomach and small bowel are unremarkable. There is no lymphadenopathy. Trace amount of free pelvic fluid is noted. No organized fluid collection. No focal bony abnormality. IMPRESSION: No source for the patient's infection is identified. Very small bilateral pleural effusions with a small to moderate pericardial effusion also seen. Calcific aortic  and coronary atherosclerosis. Prominent stool burden in the  rectosigmoid colon. Mild enlargement of prostate gland. Body wall edema and trace amount of free pelvic fluid compatible with anasarca. Electronically Signed   By: Inge Rise M.D.   On: 06/15/2015 13:45   Ct Abdomen Pelvis W Contrast  06/15/2015  CLINICAL DATA:  Sepsis. Urinary tract infection. Hypotension. Initial encounter. EXAM: CT CHEST, ABDOMEN, AND PELVIS WITH CONTRAST TECHNIQUE: Multidetector CT imaging of the chest, abdomen and pelvis was performed following the standard protocol during bolus administration of intravenous contrast. CONTRAST:  100 mL OMNIPAQUE IOHEXOL 300 MG/ML  SOLN COMPARISON:  PA and lateral chest 06/13/2015. FINDINGS: CT CHEST The patient has small bilateral pleural effusions. Moderate pericardial effusion is also seen. Heart size is normal. Calcific aortic and coronary atherosclerosis is identified. No axillary, hilar or mediastinal lymphadenopathy. Lungs demonstrate some linear atelectasis in the right lower lobe. There is also mild dependent atelectasis. The lungs are otherwise clear. No lytic or sclerotic bony lesion is seen. Remote lower left rib fractures are incidentally noted. CT ABDOMEN AND PELVIS The gallbladder, spleen, adrenal glands, kidneys, pancreas and biliary tree all appear normal. A 0.9 cm hypo attenuating lesion in the dome of the liver is compatible with a cyst. The liver is otherwise unremarkable. There is aortoiliac atherosclerosis without aneurysm. Body wall edema is noted. The prostate gland is mildly prominent. Urinary bladder and seminal vesicles are unremarkable. Prominent stool in the rectosigmoid colon is noted. The stomach and small bowel are unremarkable. There is no lymphadenopathy. Trace amount of free pelvic fluid is noted. No organized fluid collection. No focal bony abnormality. IMPRESSION: No source for the patient's infection is identified. Very small bilateral pleural effusions with a small to moderate pericardial effusion also seen.  Calcific aortic and coronary atherosclerosis. Prominent stool burden in the rectosigmoid colon. Mild enlargement of prostate gland. Body wall edema and trace amount of free pelvic fluid compatible with anasarca. Electronically Signed   By: Inge Rise M.D.   On: 06/15/2015 13:45   Mr Knee Right W Wo Contrast  06/16/2015  CLINICAL DATA:  Knee pain, swelling, and cellulitis, especially anteriorly. EXAM: MRI OF THE RIGHT KNEE WITHOUT AND WITH CONTRAST TECHNIQUE: Multiplanar, multisequence MR imaging of the knee was performed before and after the administration of intravenous contrast. CONTRAST:  9 cc MultiHance COMPARISON:  06/13/2015 FINDINGS: Despite efforts by the technologist and patient, motion artifact is present on today's exam and could not be eliminated. This reduces exam sensitivity and specificity. MENISCI Medial meniscus:  Unremarkable Lateral meniscus:  Unremarkable LIGAMENTS Cruciates:  Proximal PCL degeneration. Collaterals:  Mildly thickened MCL. CARTILAGE Patellofemoral: Suspected moderate chondral thinning and potential articular spurring along the medial patellar facet articular surface. Characterization problematic due to motion artifact. Medial: Suspected chondral thinning along the medial tibial plateau. Lateral:  Suspected mild chondral thinning. Joint: Small to moderate knee effusion with mild synovitis. Edema tracks in Hoffa's fat pad. Popliteal Fossa: There is considerable edema in the biceps femoris muscle, image 5 series 9. Edema also tracks in the anterior compartment of the upper calf, and there is diffuse subcutaneous edema around the knee especially posteriorly, laterally, and medially. Extensor Mechanism: Ossifications are present in the distal quadriceps tendon which also appears partially torn posteriorly in its vastus intermedius portion. Mild thickening of the patellar tendon. Suspected detachment of the medial patellar retinaculum from the medial patella, images 7 through 12  series 4. Poor definition of the lateral patellar retinaculum. Bones: There is abnormal edema superiorly  in the patella, image 18 series 7, primarily anteriorly in the vicinity of the quadriceps fibrocartilage continuation and adjacent bone. IMPRESSION: 1. Distal posterior quadriceps appears torn. This likely corresponds to the vastus intermedius portion of the tendon. There is also edema in the upper margin of the patella with only very faint associated enhancement, likely reactive rather than due to osteomyelitis. Ossifications noted in the distal quadriceps tendon based on conventional radiography. Thickened patellar tendon. 2. Diffuse subcutaneous edema, nonspecific but a component of this may reflect cellulitis. 3. Small to moderate knee effusion but with only mild synovitis and without findings characteristic of septic joint. 4. Variable but mainly mild to moderate degrees of chondral thinning in the knee compartments. 5. Discontinuity medially in the medial patellar retinaculum compatible with tear. Indistinct lateral patellar retinaculum, nonspecific. 6. Despite efforts by the technologist and patient, motion artifact is present on today's exam and could not be eliminated. This reduces exam sensitivity and specificity. 7. Mildly thickened MCL could be a result of a prior injury. 8. Mild proximal PCL degeneration. Electronically Signed   By: Van Clines M.D.   On: 06/16/2015 19:42   Mr Knee Left W Wo Contrast  06/17/2015  CLINICAL DATA:  Cellulitis and swelling left knee pain. Prior patellar removal. Difficulty moving the lower extremity. EXAM: MRI OF THE LEFT KNEE WITHOUT AND WITH CONTRAST TECHNIQUE: Multiplanar, multisequence MR imaging of the knee was performed before and after the administration of intravenous contrast. CONTRAST:  3m MULTIHANCE GADOBENATE DIMEGLUMINE 529 MG/ML IV SOLN COMPARISON:  06/13/2015 FINDINGS: MENISCI Medial meniscus: Diminutive, indistinct, and irregular posterior horn  medially, likely torn but indistinct. Diminutive anterior horn, likely torn. Lateral meniscus: Very little in the way of recognizable meniscal tissue, with a thin band of tissue likely corresponding to the lateral meniscus noted along the periphery of the joint on most images. LIGAMENTS Cruciates: Abnormal slope and indistinctness of the ACL favoring ACL tear. PCL degeneration proximally. Collaterals: Highly indistinct proximal MCL, thickened and edematous, likely torn. Highly indistinct proximal fibular collateral ligament, at least a grade 2 sprain. Prominent tendinopathy or partial tearing of the proximal popliteus tendon. CARTILAGE Patellofemoral: The patella is absent. Femoral trochlear groove articular cartilage thin and indistinct. Medial: Severe chondral loss is specially anteriorly in the joint. Diffuse subcortical edema. Lateral:  Markedly severe chondral loss.  Diffuse subcortical edema. Joint: Markedly severe synovitis in Hoffa's fat pad and along the expected location of the suprapatellar bursa. Suspected inferior plica or band of fibrosis extending from the patellectomy site towards the ACL. Prominent synovitis posteriorly in the knee joint. Popliteal Fossa: Diffuse edema in the subcutaneous tissues and tracking within along the proximal gastrocnemius musculature, popliteus musculature, and proximal soleus musculature. There is also low-level edema in the anterior compartment musculature in the lower leg. Extensor Mechanism: Indistinct connection of the quadriceps tendons to the patellectomy site, probably discontinuous. On images 17-18 there appears to be a fluid-filled potentially draining collection extending from the joint to the scan of the patellectomy site anteriorly. Severe surrounding synovitis. The thin residual patellar tendon appears to attach to several small bony ossicles but is not thought to be providing a by mechanically significant connection to the quadriceps tendons. The retinacular  highly indistinct and likely discontinuous. Bones: Prominent edema and enhancement in both femoral condyles and throughout the tibial plateau. IMPRESSION: 1. Markedly severe synovitis with possible draining track extending from the joint to the anterior skin surface in the vicinity of the patellectomy. There is prominent edema and enhancement in  the femoral condyles and tibial plateau raising suspicion of a septic joint with osteomyelitis, although very little fluid to drain separate from the severe synovitis. 2. Highly indistinct quadriceps and proximal patellar tendon in the vicinity of the patellectomy, probably discontinuous and likely not by mechanically significant. The patellar retinacula likewise appear discontinuous/detached. 3. Abnormal ACL slope, quite likely torn. 4. Severe diminution in size and indistinctness of the lateral meniscus, favoring high-grade tearing. Diminutive size of portions of the medial meniscus compatible with meniscal tears. 5. Highly indistinct, thickened, and edematous proximal MCL, likely torn. Grade 2 sprain of the fibular collateral ligament and partial tearing or tendinopathy of the proximal popliteus tendon. 6. Severe loss of articular cartilage in the medial and lateral compartments (especially the lateral compartment). Thinning of the femoral trochlear groove articular cartilage. 7. There is a band of fibrosis extending to a medial plica from the patellectomy site. 8. Diffuse subcutaneous edema favoring cellulitis. Electronically Signed   By: Van Clines M.D.   On: 06/17/2015 07:46         Subjective: Patient complains of pain in his knees, but states that they are somewhat better than at the time of presentation. Denies any fevers, chills, chest pain, shortness breath, nausea, vomiting, diarrhea, abdominal pain, headache, neck pain. No rashes or synovitis.  Objective: Filed Vitals:   06/16/15 1718 06/16/15 2014 06/17/15 0535 06/17/15 1138  BP: 123/77  1_0  Pulse: 117 115 80 91  Temp:  97.7 F (36.5 C) 98 F (36.7 C) 97.8 F (36.6 C)  TempSrc:  Oral Oral Oral  Resp: _1 Height:      Weight:      SpO2: 98% 98% 100% 98%    Intake/Output Summary (Last 24 hours) at 06/17/15 1217 Last data filed at 06/16/15 1600  Gross per 24 hour  Intake     50 ml  Output      0 ml  Net     50 ml   Weight change:  Exam:   General:  Pt is alert, follows commands appropriately, not in acute distress  HEENT: No icterus, No thrush, No neck mass, [place]/AT  Cardiovascular: RRR, S1/S2, no rubs, no gallops  Respiratory: Bibasilar crackles without wheezing. Good air movement.  Abdomen: Soft/+BS, non tender, non distended, no guarding Extremities: 1+LE edema, No lymphangitis, No petechiae, No rashes, no synovitis; bilateral knee wounds without any purulent drainage. Good granulation tissue. No lymphangitis, necrosis, crepitance Data Reviewed: Basic Metabolic Panel:  Recent Labs Lab 06/13/15 1259 06/14/15 0304 06/16/15 0327  NA 141 141 141  K 4.0 3.9 3.6  CL 99* 107 106  CO2 _2 GLUCOSE 126* 80 80  BUN 14 11 <5*  CREATININE 0.88 0.60* 0.50*  CALCIUM 8.8* 8.5* 8.4*   Liver Function Tests:  Recent Labs Lab 06/13/15 1259 06/14/15 0304  AST 27 27  ALT 9* 11*  ALKPHOS 89 71  BILITOT 1.2 0.6  PROT 7.0 5.9*  ALBUMIN 2.6* 2.2*   No results for input(s): LIPASE, AMYLASE in the last 168 hours. No results for input(s): AMMONIA in the last 168 hours. CBC:  Recent Labs Lab 06/13/15 1259 06/14/15 0304 06/16/15 0327  WBC 13.4* 10.2 7.4  NEUTROABS 11.7*  --   --   HGB 11.7* 10.8* 11.0*  HCT 38.9* 34.7* 35.5*  MCV 81.6 80.1 80.3  PLT 561* 473* 357   Cardiac Enzymes: No results for input(s): CKTOTAL, CKMB, CKMBINDEX, TROPONINI in the last 168 hours.  BNP: Invalid input(s): POCBNP CBG: No results for input(s): GLUCAP in the last 168 hours.  Recent Results (from the past 240 hour(s))  Culture, blood  (routine x 2)     Status: None (Preliminary result)   Collection Time: 06/13/15 11:07 AM  Result Value Ref Range Status   Specimen Description BLOOD LEFT ANTECUBITAL  Final   Special Requests IN PEDIATRIC BOTTLE 4ML  Final   Culture   Final    NO GROWTH 4 DAYS Performed at [facility]    Report Status PENDING  Incomplete  Culture, blood (routine x 2)     Status: None (Preliminary result)   Collection Time: 06/13/15 11:08 AM  Result Value Ref Range Status   Specimen Description BLOOD LEFT HAND  Final   Special Requests BOTTLES DRAWN AEROBIC AND ANAEROBIC 3ML  Final   Culture   Final    NO GROWTH 4 DAYS Performed at [facility]    Report Status PENDING  Incomplete  Urine culture     Status: None   Collection Time: 06/13/15 12:55 PM  Result Value Ref Range Status   Specimen Description URINE, CLEAN CATCH  Final   Special Requests NONE  Final   Culture   Final    MULTIPLE SPECIES PRESENT, SUGGEST RECOLLECTION Performed at [facility]    Report Status 06/14/2015 FINAL  Final  C difficile quick scan w PCR reflex     Status: None   Collection Time: 06/13/15  4:26 PM  Result Value Ref Range Status   C Diff antigen NEGATIVE NEGATIVE Final   C Diff toxin NEGATIVE NEGATIVE Final   C Diff interpretation Negative for toxigenic C. difficile  Final  MRSA PCR Screening     Status: None   Collection Time: 06/13/15  5:46 PM  Result Value Ref Range Status   MRSA by PCR NEGATIVE NEGATIVE Final    Comment:        The GeneXpert MRSA Assay (FDA approved for NASAL specimens only), is one component of a comprehensive MRSA colonization surveillance program. It is not intended to diagnose MRSA infection nor to guide or monitor treatment for MRSA infections.   Gastrointestinal Panel by PCR , Stool     Status: None   Collection Time: 06/14/15 11:50 AM  Result Value Ref Range Status   Campylobacter species NOT DETECTED NOT DETECTED Final   Plesimonas shigelloides NOT  DETECTED NOT DETECTED Final   Salmonella species NOT DETECTED NOT DETECTED Final   Yersinia enterocolitica NOT DETECTED NOT DETECTED Final   Vibrio species NOT DETECTED NOT DETECTED Final   Vibrio cholerae NOT DETECTED NOT DETECTED Final   Enteroaggregative E coli (EAEC) NOT DETECTED NOT DETECTED Final   Enteropathogenic E coli (EPEC) NOT DETECTED NOT DETECTED Final   Enterotoxigenic E coli (ETEC) NOT DETECTED NOT DETECTED Final   Shiga like toxin producing E coli (STEC) NOT DETECTED NOT DETECTED Final   E. coli O157 NOT DETECTED NOT DETECTED Final   Shigella/Enteroinvasive E coli (EIEC) NOT DETECTED NOT DETECTED Final   Cryptosporidium NOT DETECTED NOT DETECTED Final   Cyclospora cayetanensis NOT DETECTED NOT DETECTED Final   Entamoeba histolytica NOT DETECTED NOT DETECTED Final   Giardia lamblia NOT DETECTED NOT DETECTED Final   Adenovirus F40/41 NOT DETECTED NOT DETECTED Final   Astrovirus NOT DETECTED NOT DETECTED Final   Norovirus GI/GII NOT DETECTED NOT DETECTED Final   Rotavirus A NOT DETECTED NOT DETECTED Final   Sapovirus (I, II, IV, and V)  NOT DETECTED NOT DETECTED Final     Scheduled Meds: . antiseptic oral rinse  7 mL Mouth Rinse BID  . collagenase   Topical Daily  . divalproex  500 mg Oral BID  . docusate sodium  200 mg Oral QHS  . enoxaparin (LOVENOX) injection  40 mg Subcutaneous Q24H  . ferrous sulfate  325 mg Oral BID  . folic acid  1 mg Oral Daily  . multivitamin with minerals  1 tablet Oral Daily  . pantoprazole  40 mg Oral QAC breakfast  . sodium chloride flush  3 mL Intravenous Q12H  . thiamine  100 mg Oral Daily  . traZODone  25 mg Oral TID  . vitamin C  500 mg Oral 2 times per day   Continuous Infusions:    Louanne Calvillo, DO  [facility] Pager [phone]  If 7PM-7AM, please contact night-coverage [email or web address] Password [email or web address] 06/17/2015, 12:17 PM   LOS: 4 days

## 2015-06-17 NOTE — Clinical Documentation Improvement (Signed)
Internal Medicine  Can the diagnosis of Encephalopathy be further specified in progress notes and discharge?    Acute Metabolic Encephalopathy  Other  Clinically Undetermined  Document any associated diagnoses/conditions.   Supporting Information:  65 year old male admitted with sepsis suspected to be secondary to UTI  H&P Problem list: Encephalopathy  2/28 & 3/1 progress note: Encephalopathy - not certain of his baseline  - not certain if he has an underlying psych diagnosis as he is on Depakote and Trazodone   3/2 progress note Encephalopathy - not certain of his baseline, but quite lucid on 06/16/15  - not certain if he has an underlying psych diagnosis as he is on Depakote and Trazodone   3/3 progress note Encephalopathy - not certain of his baseline, but quite lucid on 06/16/15  -appears to be back to baseline 0/8/653/3/17 - not certain if he has an underlying psych diagnosis as he is on Depakote and Trazodone   Treatments  IV antibiotic Wound care Oxygen therapy Depakote Trazodone  Please exercise your independent, professional judgment when responding. A specific answer is not anticipated or expected.   Thank You,  Harless Littenebora T Willean Schurman [facility] [place] [phone]

## 2015-06-17 NOTE — Care Management Important Message (Signed)
Important Message  Patient Details  Name: Timothy Norman MRN: [id number] Date of Birth: 10/17/1950   Medicare Important Message Given:  Yes    Haskell FlirtJamison, Hiilani Jetter 06/17/2015, 2:55 PMImportant Message  Patient Details  Name: Timothy DubinWarren D Norman MRN: [id number] Date of Birth: 10/26/1950   Medicare Important Message Given:  Yes    Haskell FlirtJamison, Therman Hughlett 06/17/2015, 2:54 PM

## 2015-06-18 DIAGNOSIS — M8668 Other chronic osteomyelitis, other site: Secondary | ICD-10-CM

## 2015-06-18 DIAGNOSIS — E860 Dehydration: Secondary | ICD-10-CM

## 2015-06-18 LAB — BASIC METABOLIC PANEL
ANION GAP: 8 (ref 5–15)
BUN: 6 mg/dL (ref 6–20)
CHLORIDE: 106 mmol/L (ref 101–111)
CO2: 26 mmol/L (ref 22–32)
Calcium: 8.2 mg/dL — ABNORMAL LOW (ref 8.9–10.3)
Creatinine, Ser: 0.45 mg/dL — ABNORMAL LOW (ref 0.61–1.24)
GFR calc Af Amer: >60 mL/min (ref >60)
GFR calc non Af Amer: >60 mL/min (ref >60)
Glucose, Bld: 64 mg/dL — ABNORMAL LOW (ref 65–99)
POTASSIUM: 3.6 mmol/L (ref 3.5–5.1)
SODIUM: 140 mmol/L (ref 135–145)

## 2015-06-18 LAB — CULTURE, BLOOD (ROUTINE X 2)
CULTURE: NO GROWTH
Culture: NO GROWTH

## 2015-06-18 LAB — CBC
HCT: 30 % — ABNORMAL LOW (ref 39.0–52.0)
HEMOGLOBIN: 9.1 g/dL — AB (ref 13.0–17.0)
MCH: 24.6 pg — AB (ref 26.0–34.0)
MCHC: 30.3 g/dL (ref 30.0–36.0)
MCV: 81.1 fL (ref 78.0–100.0)
Platelets: 363 10*3/uL (ref 150–400)
RBC: 3.7 MIL/uL — AB (ref 4.22–5.81)
RDW: 20.7 % — ABNORMAL HIGH (ref 11.5–15.5)
WBC: 5.8 10*3/uL (ref 4.0–10.5)

## 2015-06-18 LAB — TSH: TSH: 2.534 u[IU]/mL (ref 0.350–4.500)

## 2015-06-18 MED ORDER — IBUPROFEN 600 MG PO TABS
600.0000 mg | ORAL_TABLET | Freq: Three times a day (TID) | ORAL | Status: DC
  Administered 2015-06-18 – 2015-06-21 (×10): 600 mg via ORAL
  Filled 2015-06-18: qty 1
  Filled 2015-06-18: qty 3
  Filled 2015-06-18 (×4): qty 1
  Filled 2015-06-18: qty 3
  Filled 2015-06-18: qty 1
  Filled 2015-06-18: qty 3
  Filled 2015-06-18 (×6): qty 1

## 2015-06-18 NOTE — Progress Notes (Signed)
Spoke with pt again about drsg changes...pt agreed to have drsgs changed at 2015 when his IV pain medication is due again.  Will advise night nurse.

## 2015-06-18 NOTE — Progress Notes (Signed)
PROGRESS NOTE  Timothy Norman WKM:[id number] DOB: April 22, 1950 DOA: 06/13/2015 PCP: [name], MD  Brief narrative: Timothy Norman is a 65 y.o. male with history of extensive bilateral knee injury secondary to an accident under care of Dr. [name] at the wound center. He is been living in a nursing facility and presents for hypotension and tachycardia. He is a poor historian and proper history is unobtainable. He did admit on admission to having diarrhea in 2 days of weakness. He was also noted to be having pain while urinating in the ER. UA had pyuria and nitrites, and it was suspected he may have a UTI. He was also found to have lactic acidosis.   Assessment/Plan: Sepsis ([place]) -Initially thought to be due to UTI, but most likely due to infected L-knee -blood cultures remain negative -h/o b/l septic knees-s/p debridement 10/26/14 with MSSA and Pseudomonas -Xrays of bilateral knees reveals soft tissue swelling in the left knee "concerning for an infectious process"-there are also some concerns of possible osteomyelitis -orthopedics Dr. [name] evaluated the patient as he performed surgery on his knee last year--pt refused to allow Dr. [name] to fully examine him -CT scan of the abdomen and pelvis was ordered but patient is refusing this initially -06/15/2015 CT abdomen pelvis negative for acute findings or bony abnormalities - currently on Vanc and Cefepime for now  -06/15/2015 CT chest negative for infiltrates or masses -ESR--43, CRP--5.2 -MRI of knees-L-knee with appearance of synovitis from the joint to the anterior skin surface with enhancement of the femoral condyle and tibial plateau concerning for osteomyelitis or septic joint -d/c vancomycin -06/17/15-case discussed with ortho, Dr. [name], who felt that another debridement would not be fruitful as infection would recur in presence of fistulous tract--stated that pt needs L-AKA or tx with abx long term with possible flap in  future -pt refuses AKA at this time -06/17/15-case discussed with IR, Dr. [name] try to obtain fluid or swab tract -please send any fluid for cell count -place PICC and tx L-knee infection--plan 4-6 weeks abx  Labile BP -am cortisol--14.8 -?dysautonomia -BP overall improving, but still having periods of lability  Diarrhea -improved -C. Diff and GI pathogen panel neg  Wounds, multiple - no external signs of infection-  -Appreciate wound care evaluation -right gluteal wound unstageable without purulent drainage or surrounding cellulitis  Pericardial effusion -06/15/15--Echocardiogram--moderate pericardial effusion with EF 11-65%, grade 1 diastolic dysfunction, no WMA; no hemodynamic compromise and -pt had trivial pericardial effusion on 10/30/14 -unclear reason for increase in pericardial effusion -appreciate cardiology--no indication for invasive procedure presently -admission EKG without ST changes -trial of ibuprofen--monitor renal function   Encephalopathy - not certain of his baseline, but quite lucid on 06/16/15  -appears to be back to baseline 10/22/01 - not certain if he has an underlying psych diagnosis as he is on Depakote and Trazodone    Family Communication: No family at beside Disposition Plan: SNF >1-2 days     Procedures/Studies: Dg Chest 2 View  06/13/2015  CLINICAL DATA:  Increased weakness. EXAM: CHEST  2 VIEW COMPARISON:  October 26, 2014. FINDINGS: The heart size and mediastinal contours are within normal limits. No pneumothorax or pleural effusion is noted. Left lung is clear. Mild right basilar atelectasis or scarring is noted with associated nodular density. Probable old left rib fracture is noted. IMPRESSION: Mild right basilar atelectasis or scarring is noted with probable nodular density. CT scan of the chest is recommended  to rule out mass or neoplasm. Electronically Signed   By: Marijo Conception, M.D.   On: 06/13/2015 11:59   Dg Knee 1-2 Views  Left  06/13/2015  CLINICAL DATA:  Patient with sepsis. Patient reports no pain. Recent history patellectomy. EXAM: LEFT KNEE - 1-2 VIEW COMPARISON:  Knee radiograph 10/26/2014 FINDINGS: Interval removal of the patella. There is extensive swelling about the knee joint. Limited evaluation for cortical irregularity. Multiple new ossific densities are demonstrated within the overlying soft tissues. Markedly limited exam secondary to poor patient positioning as the patient would not cooperate with the radiographs. IMPRESSION: Marked soft tissue swelling about the knee concerning for an infectious process. Overall the exam is markedly limited secondary to difficulty with patient positioning as the patient did not cooperate with the examination. Recommend repeat evaluation when patient is clinically able. Multiple new calcific densities about the knee joint which for nonspecific may represent heterotopic calcification versus sequelae of infection. Electronically Signed   By: Lovey Newcomer M.D.   On: 06/13/2015 14:44   Dg Knee 1-2 Views Right  06/13/2015  CLINICAL DATA:  Sepsis with unknown source EXAM: RIGHT KNEE - 1-2 VIEW COMPARISON:  October 26, 2014 FINDINGS: Frontal and lateral views were obtained. There is no acute fracture or dislocation. There is no appreciable knee joint effusion. There is calcification within the knee joint anteriorly and superiorly, possibly residua of old trauma or infection. The bones appear somewhat osteoporotic. No well-defined bony destruction is appreciable. There is slight narrowing medially and in the patellofemoral joint region. There are foci of arterial vascular calcification present. IMPRESSION: No well-defined bony destruction. Periarticular osteoporosis, particularly in the distal medial femoral condyle, could mask a degree of subtle osteomyelitis. Calcification in the knee joint is probably due to residua of either old trauma or infection. There is no acute fracture or dislocation.  No joint effusion evident. There are foci of atherosclerotic calcification. If there remains concern for potential osteomyelitis, either MRI or three-phase nuclear medicine bone scan potentially could be helpful for further assessment. Electronically Signed   By: Lowella Grip III M.D.   On: 06/13/2015 14:38   Ct Chest W Contrast  06/15/2015  CLINICAL DATA:  Sepsis. Urinary tract infection. Hypotension. Initial encounter. EXAM: CT CHEST, ABDOMEN, AND PELVIS WITH CONTRAST TECHNIQUE: Multidetector CT imaging of the chest, abdomen and pelvis was performed following the standard protocol during bolus administration of intravenous contrast. CONTRAST:  100 mL OMNIPAQUE IOHEXOL 300 MG/ML  SOLN COMPARISON:  PA and lateral chest 06/13/2015. FINDINGS: CT CHEST The patient has small bilateral pleural effusions. Moderate pericardial effusion is also seen. Heart size is normal. Calcific aortic and coronary atherosclerosis is identified. No axillary, hilar or mediastinal lymphadenopathy. Lungs demonstrate some linear atelectasis in the right lower lobe. There is also mild dependent atelectasis. The lungs are otherwise clear. No lytic or sclerotic bony lesion is seen. Remote lower left rib fractures are incidentally noted. CT ABDOMEN AND PELVIS The gallbladder, spleen, adrenal glands, kidneys, pancreas and biliary tree all appear normal. A 0.9 cm hypo attenuating lesion in the dome of the liver is compatible with a cyst. The liver is otherwise unremarkable. There is aortoiliac atherosclerosis without aneurysm. Body wall edema is noted. The prostate gland is mildly prominent. Urinary bladder and seminal vesicles are unremarkable. Prominent stool in the rectosigmoid colon is noted. The stomach and small bowel are unremarkable. There is no lymphadenopathy. Trace amount of free pelvic fluid is noted. No organized fluid collection. No focal bony abnormality.  IMPRESSION: No source for the patient's infection is identified. Very  small bilateral pleural effusions with a small to moderate pericardial effusion also seen. Calcific aortic and coronary atherosclerosis. Prominent stool burden in the rectosigmoid colon. Mild enlargement of prostate gland. Body wall edema and trace amount of free pelvic fluid compatible with anasarca. Electronically Signed   By: Inge Rise M.D.   On: 06/15/2015 13:45   Ct Abdomen Pelvis W Contrast  06/15/2015  CLINICAL DATA:  Sepsis. Urinary tract infection. Hypotension. Initial encounter. EXAM: CT CHEST, ABDOMEN, AND PELVIS WITH CONTRAST TECHNIQUE: Multidetector CT imaging of the chest, abdomen and pelvis was performed following the standard protocol during bolus administration of intravenous contrast. CONTRAST:  100 mL OMNIPAQUE IOHEXOL 300 MG/ML  SOLN COMPARISON:  PA and lateral chest 06/13/2015. FINDINGS: CT CHEST The patient has small bilateral pleural effusions. Moderate pericardial effusion is also seen. Heart size is normal. Calcific aortic and coronary atherosclerosis is identified. No axillary, hilar or mediastinal lymphadenopathy. Lungs demonstrate some linear atelectasis in the right lower lobe. There is also mild dependent atelectasis. The lungs are otherwise clear. No lytic or sclerotic bony lesion is seen. Remote lower left rib fractures are incidentally noted. CT ABDOMEN AND PELVIS The gallbladder, spleen, adrenal glands, kidneys, pancreas and biliary tree all appear normal. A 0.9 cm hypo attenuating lesion in the dome of the liver is compatible with a cyst. The liver is otherwise unremarkable. There is aortoiliac atherosclerosis without aneurysm. Body wall edema is noted. The prostate gland is mildly prominent. Urinary bladder and seminal vesicles are unremarkable. Prominent stool in the rectosigmoid colon is noted. The stomach and small bowel are unremarkable. There is no lymphadenopathy. Trace amount of free pelvic fluid is noted. No organized fluid collection. No focal bony abnormality.  IMPRESSION: No source for the patient's infection is identified. Very small bilateral pleural effusions with a small to moderate pericardial effusion also seen. Calcific aortic and coronary atherosclerosis. Prominent stool burden in the rectosigmoid colon. Mild enlargement of prostate gland. Body wall edema and trace amount of free pelvic fluid compatible with anasarca. Electronically Signed   By: Inge Rise M.D.   On: 06/15/2015 13:45   Mr Knee Right W Wo Contrast  06/16/2015  CLINICAL DATA:  Knee pain, swelling, and cellulitis, especially anteriorly. EXAM: MRI OF THE RIGHT KNEE WITHOUT AND WITH CONTRAST TECHNIQUE: Multiplanar, multisequence MR imaging of the knee was performed before and after the administration of intravenous contrast. CONTRAST:  9 cc MultiHance COMPARISON:  06/13/2015 FINDINGS: Despite efforts by the technologist and patient, motion artifact is present on today's exam and could not be eliminated. This reduces exam sensitivity and specificity. MENISCI Medial meniscus:  Unremarkable Lateral meniscus:  Unremarkable LIGAMENTS Cruciates:  Proximal PCL degeneration. Collaterals:  Mildly thickened MCL. CARTILAGE Patellofemoral: Suspected moderate chondral thinning and potential articular spurring along the medial patellar facet articular surface. Characterization problematic due to motion artifact. Medial: Suspected chondral thinning along the medial tibial plateau. Lateral:  Suspected mild chondral thinning. Joint: Small to moderate knee effusion with mild synovitis. Edema tracks in Hoffa's fat pad. Popliteal Fossa: There is considerable edema in the biceps femoris muscle, image 5 series 9. Edema also tracks in the anterior compartment of the upper calf, and there is diffuse subcutaneous edema around the knee especially posteriorly, laterally, and medially. Extensor Mechanism: Ossifications are present in the distal quadriceps tendon which also appears partially torn posteriorly in its vastus  intermedius portion. Mild thickening of the patellar tendon. Suspected detachment of the medial  patellar retinaculum from the medial patella, images 7 through 12 series 4. Poor definition of the lateral patellar retinaculum. Bones: There is abnormal edema superiorly in the patella, image 18 series 7, primarily anteriorly in the vicinity of the quadriceps fibrocartilage continuation and adjacent bone. IMPRESSION: 1. Distal posterior quadriceps appears torn. This likely corresponds to the vastus intermedius portion of the tendon. There is also edema in the upper margin of the patella with only very faint associated enhancement, likely reactive rather than due to osteomyelitis. Ossifications noted in the distal quadriceps tendon based on conventional radiography. Thickened patellar tendon. 2. Diffuse subcutaneous edema, nonspecific but a component of this may reflect cellulitis. 3. Small to moderate knee effusion but with only mild synovitis and without findings characteristic of septic joint. 4. Variable but mainly mild to moderate degrees of chondral thinning in the knee compartments. 5. Discontinuity medially in the medial patellar retinaculum compatible with tear. Indistinct lateral patellar retinaculum, nonspecific. 6. Despite efforts by the technologist and patient, motion artifact is present on today's exam and could not be eliminated. This reduces exam sensitivity and specificity. 7. Mildly thickened MCL could be a result of a prior injury. 8. Mild proximal PCL degeneration. Electronically Signed   By: Van Clines M.D.   On: 06/16/2015 19:42   Mr Knee Left W Wo Contrast  06/17/2015  CLINICAL DATA:  Cellulitis and swelling left knee pain. Prior patellar removal. Difficulty moving the lower extremity. EXAM: MRI OF THE LEFT KNEE WITHOUT AND WITH CONTRAST TECHNIQUE: Multiplanar, multisequence MR imaging of the knee was performed before and after the administration of intravenous contrast. CONTRAST:  66m  MULTIHANCE GADOBENATE DIMEGLUMINE 529 MG/ML IV SOLN COMPARISON:  06/13/2015 FINDINGS: MENISCI Medial meniscus: Diminutive, indistinct, and irregular posterior horn medially, likely torn but indistinct. Diminutive anterior horn, likely torn. Lateral meniscus: Very little in the way of recognizable meniscal tissue, with a thin band of tissue likely corresponding to the lateral meniscus noted along the periphery of the joint on most images. LIGAMENTS Cruciates: Abnormal slope and indistinctness of the ACL favoring ACL tear. PCL degeneration proximally. Collaterals: Highly indistinct proximal MCL, thickened and edematous, likely torn. Highly indistinct proximal fibular collateral ligament, at least a grade 2 sprain. Prominent tendinopathy or partial tearing of the proximal popliteus tendon. CARTILAGE Patellofemoral: The patella is absent. Femoral trochlear groove articular cartilage thin and indistinct. Medial: Severe chondral loss is specially anteriorly in the joint. Diffuse subcortical edema. Lateral:  Markedly severe chondral loss.  Diffuse subcortical edema. Joint: Markedly severe synovitis in Hoffa's fat pad and along the expected location of the suprapatellar bursa. Suspected inferior plica or band of fibrosis extending from the patellectomy site towards the ACL. Prominent synovitis posteriorly in the knee joint. Popliteal Fossa: Diffuse edema in the subcutaneous tissues and tracking within along the proximal gastrocnemius musculature, popliteus musculature, and proximal soleus musculature. There is also low-level edema in the anterior compartment musculature in the lower leg. Extensor Mechanism: Indistinct connection of the quadriceps tendons to the patellectomy site, probably discontinuous. On images 17-18 there appears to be a fluid-filled potentially draining collection extending from the joint to the scan of the patellectomy site anteriorly. Severe surrounding synovitis. The thin residual patellar tendon  appears to attach to several small bony ossicles but is not thought to be providing a by mechanically significant connection to the quadriceps tendons. The retinacular highly indistinct and likely discontinuous. Bones: Prominent edema and enhancement in both femoral condyles and throughout the tibial plateau. IMPRESSION: 1. Markedly severe synovitis with  possible draining track extending from the joint to the anterior skin surface in the vicinity of the patellectomy. There is prominent edema and enhancement in the femoral condyles and tibial plateau raising suspicion of a septic joint with osteomyelitis, although very little fluid to drain separate from the severe synovitis. 2. Highly indistinct quadriceps and proximal patellar tendon in the vicinity of the patellectomy, probably discontinuous and likely not by mechanically significant. The patellar retinacula likewise appear discontinuous/detached. 3. Abnormal ACL slope, quite likely torn. 4. Severe diminution in size and indistinctness of the lateral meniscus, favoring high-grade tearing. Diminutive size of portions of the medial meniscus compatible with meniscal tears. 5. Highly indistinct, thickened, and edematous proximal MCL, likely torn. Grade 2 sprain of the fibular collateral ligament and partial tearing or tendinopathy of the proximal popliteus tendon. 6. Severe loss of articular cartilage in the medial and lateral compartments (especially the lateral compartment). Thinning of the femoral trochlear groove articular cartilage. 7. There is a band of fibrosis extending to a medial plica from the patellectomy site. 8. Diffuse subcutaneous edema favoring cellulitis. Electronically Signed   By: Van Clines M.D.   On: 06/17/2015 07:46         Subjective: Patient complains of knee pain but states that overall it is improving. Denies any fevers, chills, chest pain, shortness breath, abdominal pain, nausea, vomiting, diarrhea, headache, neck pain,  rashes.  Objective: Filed Vitals:   06/17/15 1416 06/17/15 2003 06/18/15 0525 06/18/15 1412  BP: 91/50 110/60 113/72 132/73  Pulse: 85 87 85 88  Temp: 97.8 F (36.6 C) 97.3 F (36.3 C) 97.6 F (36.4 C) 97.6 F (36.4 C)  TempSrc: Oral Oral Oral Oral  Resp: _0 Height:      Weight:      SpO2: 99% 100% 100% 100%   No intake or output data in the 24 hours ending 06/18/15 1454 Weight change:  Exam:   General:  Pt is alert, follows commands appropriately, not in acute distress  HEENT: No icterus, No thrush, No neck mass, [place]/AT  Cardiovascular: RRR, S1/S2, no rubs, no gallops  Respiratory: Poor respiratory effort but clear to auscultation.  Abdomen: Soft/+BS, non tender, non distended, no guarding  Extremities: 1+LE edema, No lymphangitis, No petechiae, No rashes, no synovitis; bilateral knee wounds without any purulent drainage. Good granulation tissue. No lymphangitis, necrosis, crepitance  Data Reviewed: Basic Metabolic Panel:  Recent Labs Lab 06/13/15 1259 06/14/15 0304 06/16/15 0327 06/18/15 0636  NA 141 141 141 140  K 4.0 3.9 3.6 3.6  CL 99* 107 106 106  CO2 _1 GLUCOSE 126* 80 80 64*  BUN 14 11 <5* 6  CREATININE 0.88 0.60* 0.50* 0.45*  CALCIUM 8.8* 8.5* 8.4* 8.2*   Liver Function Tests:  Recent Labs Lab 06/13/15 1259 06/14/15 0304  AST 27 27  ALT 9* 11*  ALKPHOS 89 71  BILITOT 1.2 0.6  PROT 7.0 5.9*  ALBUMIN 2.6* 2.2*   No results for input(s): LIPASE, AMYLASE in the last 168 hours. No results for input(s): AMMONIA in the last 168 hours. CBC:  Recent Labs Lab 06/13/15 1259 06/14/15 0304 06/16/15 0327 06/18/15 0636  WBC 13.4* 10.2 7.4 5.8  NEUTROABS 11.7*  --   --   --   HGB 11.7* 10.8* 11.0* 9.1*  HCT 38.9* 34.7* 35.5* 30.0*  MCV 81.6 80.1 80.3 81.1  PLT 561* 473* 357 363   Cardiac Enzymes: No results for input(s): CKTOTAL, CKMB, CKMBINDEX, TROPONINI in  the last 168 hours. BNP: Invalid input(s): POCBNP CBG: No  results for input(s): GLUCAP in the last 168 hours.  Recent Results (from the past 240 hour(s))  Culture, blood (routine x 2)     Status: None   Collection Time: 06/13/15 11:07 AM  Result Value Ref Range Status   Specimen Description BLOOD LEFT ANTECUBITAL  Final   Special Requests IN PEDIATRIC BOTTLE 4ML  Final   Culture   Final    NO GROWTH 5 DAYS Performed at [facility]    Report Status 06/18/2015 FINAL  Final  Culture, blood (routine x 2)     Status: None   Collection Time: 06/13/15 11:08 AM  Result Value Ref Range Status   Specimen Description BLOOD LEFT HAND  Final   Special Requests BOTTLES DRAWN AEROBIC AND ANAEROBIC 3ML  Final   Culture   Final    NO GROWTH 5 DAYS Performed at [facility]    Report Status 06/18/2015 FINAL  Final  Urine culture     Status: None   Collection Time: 06/13/15 12:55 PM  Result Value Ref Range Status   Specimen Description URINE, CLEAN CATCH  Final   Special Requests NONE  Final   Culture   Final    MULTIPLE SPECIES PRESENT, SUGGEST RECOLLECTION Performed at [facility]    Report Status 06/14/2015 FINAL  Final  C difficile quick scan w PCR reflex     Status: None   Collection Time: 06/13/15  4:26 PM  Result Value Ref Range Status   C Diff antigen NEGATIVE NEGATIVE Final   C Diff toxin NEGATIVE NEGATIVE Final   C Diff interpretation Negative for toxigenic C. difficile  Final  MRSA PCR Screening     Status: None   Collection Time: 06/13/15  5:46 PM  Result Value Ref Range Status   MRSA by PCR NEGATIVE NEGATIVE Final    Comment:        The GeneXpert MRSA Assay (FDA approved for NASAL specimens only), is one component of a comprehensive MRSA colonization surveillance program. It is not intended to diagnose MRSA infection nor to guide or monitor treatment for MRSA infections.   Gastrointestinal Panel by PCR , Stool     Status: None   Collection Time: 06/14/15 11:50 AM  Result Value Ref Range Status    Campylobacter species NOT DETECTED NOT DETECTED Final   Plesimonas shigelloides NOT DETECTED NOT DETECTED Final   Salmonella species NOT DETECTED NOT DETECTED Final   Yersinia enterocolitica NOT DETECTED NOT DETECTED Final   Vibrio species NOT DETECTED NOT DETECTED Final   Vibrio cholerae NOT DETECTED NOT DETECTED Final   Enteroaggregative E coli (EAEC) NOT DETECTED NOT DETECTED Final   Enteropathogenic E coli (EPEC) NOT DETECTED NOT DETECTED Final   Enterotoxigenic E coli (ETEC) NOT DETECTED NOT DETECTED Final   Shiga like toxin producing E coli (STEC) NOT DETECTED NOT DETECTED Final   E. coli O157 NOT DETECTED NOT DETECTED Final   Shigella/Enteroinvasive E coli (EIEC) NOT DETECTED NOT DETECTED Final   Cryptosporidium NOT DETECTED NOT DETECTED Final   Cyclospora cayetanensis NOT DETECTED NOT DETECTED Final   Entamoeba histolytica NOT DETECTED NOT DETECTED Final   Giardia lamblia NOT DETECTED NOT DETECTED Final   Adenovirus F40/41 NOT DETECTED NOT DETECTED Final   Astrovirus NOT DETECTED NOT DETECTED Final   Norovirus GI/GII NOT DETECTED NOT DETECTED Final   Rotavirus A NOT DETECTED NOT DETECTED Final   Sapovirus (I, II, IV,  and V) NOT DETECTED NOT DETECTED Final     Scheduled Meds: . antiseptic oral rinse  7 mL Mouth Rinse BID  . ceFEPime (MAXIPIME) IV  1 g Intravenous Q8H  . collagenase   Topical Daily  . divalproex  500 mg Oral BID  . docusate sodium  200 mg Oral QHS  . enoxaparin (LOVENOX) injection  40 mg Subcutaneous Q24H  . ferrous sulfate  325 mg Oral BID  . folic acid  1 mg Oral Daily  . multivitamin with minerals  1 tablet Oral Daily  . pantoprazole  40 mg Oral QAC breakfast  . sodium chloride flush  3 mL Intravenous Q12H  . thiamine  100 mg Oral Daily  . traZODone  25 mg Oral TID  . vitamin C  500 mg Oral 2 times per day   Continuous Infusions: . sodium chloride 0.9 % 1,000 mL with potassium chloride 20 mEq infusion 100 mL/hr at 06/17/15 1535     Biagio Snelson,  DO  [facility] Pager [phone]  If 7PM-7AM, please contact night-coverage [email or web address] Password [email or web address] 06/18/2015, 2:54 PM   LOS: 5 days

## 2015-06-18 NOTE — Progress Notes (Signed)
Pt refused wound care to knees, thighs, and buttock.  Discussed with pt the importance of keeping these drsgs clean and changed.  Pt continues to refuse.

## 2015-06-18 NOTE — Progress Notes (Addendum)
Spoke with pt regarding PICC placement.  Refuses PICC at this time.  States wants to discuss it with his doctor and e might be agreeable tomorrow (06-19-15).  Pt cooperative and pleasant.  If pt agreeable after speaking with the doctor, please reorder the PICC.

## 2015-06-19 DIAGNOSIS — A4101 Sepsis due to Methicillin susceptible Staphylococcus aureus: Secondary | ICD-10-CM

## 2015-06-19 LAB — BASIC METABOLIC PANEL
Anion gap: 8 (ref 5–15)
BUN: 6 mg/dL (ref 6–20)
CO2: 23 mmol/L (ref 22–32)
CREATININE: 0.52 mg/dL — AB (ref 0.61–1.24)
Calcium: 8.1 mg/dL — ABNORMAL LOW (ref 8.9–10.3)
Chloride: 110 mmol/L (ref 101–111)
GFR calc Af Amer: >60 mL/min (ref >60)
Glucose, Bld: 68 mg/dL (ref 65–99)
POTASSIUM: 3.9 mmol/L (ref 3.5–5.1)
SODIUM: 141 mmol/L (ref 135–145)

## 2015-06-19 MED ORDER — CEFAZOLIN SODIUM-DEXTROSE 2-3 GM-% IV SOLR
2.0000 g | Freq: Three times a day (TID) | INTRAVENOUS | Status: DC
  Administered 2015-06-19 – 2015-06-20 (×2): 2 g via INTRAVENOUS
  Filled 2015-06-19 (×3): qty 50

## 2015-06-19 NOTE — Progress Notes (Signed)
Patient refusing dressing changes to bilateral knees was able to convince patient to allow me to change dressing to rt buttock.

## 2015-06-19 NOTE — Progress Notes (Signed)
PROGRESS NOTE  Timothy Norman UJW:[id number] DOB: July 03, 1950 DOA: 06/13/2015 PCP: [name], MD  Brief narrative: Timothy Norman is a 65 y.o. male with history of extensive bilateral knee injury secondary to an accident under care of Dr. [name] at the wound center. He is been living in a nursing facility and presents for hypotension and tachycardia. He is a poor historian and proper history is unobtainable. He did admit on admission to having diarrhea in 2 days of weakness. He was also noted to be having pain while urinating in the ER. UA had pyuria and nitrites, and it was suspected he may have a UTI. He was also found to have lactic acidosis.   Assessment/Plan: Sepsis ([place]) -Initially thought to be due to UTI, but most likely due to infected L-knee -blood cultures remain negative -h/o b/l septic knees-s/p debridement 10/26/14 with MSSA and Pseudomonas -Xrays of bilateral knees reveals soft tissue swelling in the left knee "concerning for an infectious process"-there are also some concerns of possible osteomyelitis -orthopedics Dr. [name] evaluated the patient as he performed surgery on his knee last year--pt refused to allow Dr. [name] to fully examine him -CT scan of the abdomen and pelvis was ordered but patient is refusing this initially -06/15/2015 CT abdomen pelvis negative for acute findings or bony abnormalities - currently on Vanc and Cefepime for now  -06/15/2015 CT chest negative for infiltrates or masses -ESR--43, CRP--5.2 -MRI of knees-L-knee with appearance of synovitis from the joint to the anterior skin surface with enhancement of the femoral condyle and tibial plateau concerning for osteomyelitis or septic joint -d/c vancomycin -06/17/15-case discussed with ortho, Dr. [name], who felt that another debridement would not be fruitful as infection would recur in presence of fistulous tract--stated that pt needs L-AKA or tx with abx long term with possible flap in  future -pt refuses AKA at this time -06/17/15-case discussed with IR, Dr. [name] try to obtain fluid or swab tract -please send any fluid for cell count -place PICC and tx L-knee infection--plan 4-6 weeks abx--pt refused 06/18/15--after discussion with pt, pt now agreeable  Osteomyelitis of left knee -06/18/15 culture from IR--staph aureus -d/c cefepime -start cefazolin -plan 6 weeks IV abx -will need plastics re-eval after abx for flap -reorder PICC line -PT eval  Labile BP -am cortisol--14.8 -?dysautonomia -BP overall improving, but still having periods of lability  Diarrhea -improved -C. Diff and GI pathogen panel neg  Wounds, multiple - no external signs of infection-  -Appreciate wound care evaluation -right gluteal wound unstageable without purulent drainage or surrounding cellulitis  Pericardial effusion -06/15/15--Echocardiogram--moderate pericardial effusion with EF 56-21%, grade 1 diastolic dysfunction, no WMA; no hemodynamic compromise and -pt had trivial pericardial effusion on 10/30/14 -unclear reason for increase in pericardial effusion -appreciate cardiology--no indication for invasive procedure presently -admission EKG without ST changes -trial of ibuprofen--monitor renal function   Encephalopathy - not certain of his baseline, but quite lucid on 06/16/15  -appears to be back to baseline 3/0/86 - not certain if he has an underlying psych diagnosis as he is on Depakote and Trazodone    Family Communication: No family at beside Disposition Plan: SNF 3/6 or 3/7    Procedures/Studies: Dg Chest 2 View  06/13/2015  CLINICAL DATA:  Increased weakness. EXAM: CHEST  2 VIEW COMPARISON:  October 26, 2014. FINDINGS: The heart size and mediastinal contours are within normal limits. No pneumothorax or pleural effusion is noted. Left lung is  clear. Mild right basilar atelectasis or scarring is noted with associated nodular density. Probable old left rib fracture is  noted. IMPRESSION: Mild right basilar atelectasis or scarring is noted with probable nodular density. CT scan of the chest is recommended to rule out mass or neoplasm. Electronically Signed   By: Marijo Conception, M.D.   On: 06/13/2015 11:59   Dg Knee 1-2 Views Left  06/13/2015  CLINICAL DATA:  Patient with sepsis. Patient reports no pain. Recent history patellectomy. EXAM: LEFT KNEE - 1-2 VIEW COMPARISON:  Knee radiograph 10/26/2014 FINDINGS: Interval removal of the patella. There is extensive swelling about the knee joint. Limited evaluation for cortical irregularity. Multiple new ossific densities are demonstrated within the overlying soft tissues. Markedly limited exam secondary to poor patient positioning as the patient would not cooperate with the radiographs. IMPRESSION: Marked soft tissue swelling about the knee concerning for an infectious process. Overall the exam is markedly limited secondary to difficulty with patient positioning as the patient did not cooperate with the examination. Recommend repeat evaluation when patient is clinically able. Multiple new calcific densities about the knee joint which for nonspecific may represent heterotopic calcification versus sequelae of infection. Electronically Signed   By: Lovey Newcomer M.D.   On: 06/13/2015 14:44   Dg Knee 1-2 Views Right  06/13/2015  CLINICAL DATA:  Sepsis with unknown source EXAM: RIGHT KNEE - 1-2 VIEW COMPARISON:  October 26, 2014 FINDINGS: Frontal and lateral views were obtained. There is no acute fracture or dislocation. There is no appreciable knee joint effusion. There is calcification within the knee joint anteriorly and superiorly, possibly residua of old trauma or infection. The bones appear somewhat osteoporotic. No well-defined bony destruction is appreciable. There is slight narrowing medially and in the patellofemoral joint region. There are foci of arterial vascular calcification present. IMPRESSION: No well-defined bony  destruction. Periarticular osteoporosis, particularly in the distal medial femoral condyle, could mask a degree of subtle osteomyelitis. Calcification in the knee joint is probably due to residua of either old trauma or infection. There is no acute fracture or dislocation. No joint effusion evident. There are foci of atherosclerotic calcification. If there remains concern for potential osteomyelitis, either MRI or three-phase nuclear medicine bone scan potentially could be helpful for further assessment. Electronically Signed   By: Lowella Grip III M.D.   On: 06/13/2015 14:38   Ct Chest W Contrast  06/15/2015  CLINICAL DATA:  Sepsis. Urinary tract infection. Hypotension. Initial encounter. EXAM: CT CHEST, ABDOMEN, AND PELVIS WITH CONTRAST TECHNIQUE: Multidetector CT imaging of the chest, abdomen and pelvis was performed following the standard protocol during bolus administration of intravenous contrast. CONTRAST:  100 mL OMNIPAQUE IOHEXOL 300 MG/ML  SOLN COMPARISON:  PA and lateral chest 06/13/2015. FINDINGS: CT CHEST The patient has small bilateral pleural effusions. Moderate pericardial effusion is also seen. Heart size is normal. Calcific aortic and coronary atherosclerosis is identified. No axillary, hilar or mediastinal lymphadenopathy. Lungs demonstrate some linear atelectasis in the right lower lobe. There is also mild dependent atelectasis. The lungs are otherwise clear. No lytic or sclerotic bony lesion is seen. Remote lower left rib fractures are incidentally noted. CT ABDOMEN AND PELVIS The gallbladder, spleen, adrenal glands, kidneys, pancreas and biliary tree all appear normal. A 0.9 cm hypo attenuating lesion in the dome of the liver is compatible with a cyst. The liver is otherwise unremarkable. There is aortoiliac atherosclerosis without aneurysm. Body wall edema is noted. The prostate gland is mildly prominent. Urinary bladder  and seminal vesicles are unremarkable. Prominent stool in the  rectosigmoid colon is noted. The stomach and small bowel are unremarkable. There is no lymphadenopathy. Trace amount of free pelvic fluid is noted. No organized fluid collection. No focal bony abnormality. IMPRESSION: No source for the patient's infection is identified. Very small bilateral pleural effusions with a small to moderate pericardial effusion also seen. Calcific aortic and coronary atherosclerosis. Prominent stool burden in the rectosigmoid colon. Mild enlargement of prostate gland. Body wall edema and trace amount of free pelvic fluid compatible with anasarca. Electronically Signed   By: Inge Rise M.D.   On: 06/15/2015 13:45   Ct Abdomen Pelvis W Contrast  06/15/2015  CLINICAL DATA:  Sepsis. Urinary tract infection. Hypotension. Initial encounter. EXAM: CT CHEST, ABDOMEN, AND PELVIS WITH CONTRAST TECHNIQUE: Multidetector CT imaging of the chest, abdomen and pelvis was performed following the standard protocol during bolus administration of intravenous contrast. CONTRAST:  100 mL OMNIPAQUE IOHEXOL 300 MG/ML  SOLN COMPARISON:  PA and lateral chest 06/13/2015. FINDINGS: CT CHEST The patient has small bilateral pleural effusions. Moderate pericardial effusion is also seen. Heart size is normal. Calcific aortic and coronary atherosclerosis is identified. No axillary, hilar or mediastinal lymphadenopathy. Lungs demonstrate some linear atelectasis in the right lower lobe. There is also mild dependent atelectasis. The lungs are otherwise clear. No lytic or sclerotic bony lesion is seen. Remote lower left rib fractures are incidentally noted. CT ABDOMEN AND PELVIS The gallbladder, spleen, adrenal glands, kidneys, pancreas and biliary tree all appear normal. A 0.9 cm hypo attenuating lesion in the dome of the liver is compatible with a cyst. The liver is otherwise unremarkable. There is aortoiliac atherosclerosis without aneurysm. Body wall edema is noted. The prostate gland is mildly prominent. Urinary  bladder and seminal vesicles are unremarkable. Prominent stool in the rectosigmoid colon is noted. The stomach and small bowel are unremarkable. There is no lymphadenopathy. Trace amount of free pelvic fluid is noted. No organized fluid collection. No focal bony abnormality. IMPRESSION: No source for the patient's infection is identified. Very small bilateral pleural effusions with a small to moderate pericardial effusion also seen. Calcific aortic and coronary atherosclerosis. Prominent stool burden in the rectosigmoid colon. Mild enlargement of prostate gland. Body wall edema and trace amount of free pelvic fluid compatible with anasarca. Electronically Signed   By: Inge Rise M.D.   On: 06/15/2015 13:45   Mr Knee Right W Wo Contrast  06/16/2015  CLINICAL DATA:  Knee pain, swelling, and cellulitis, especially anteriorly. EXAM: MRI OF THE RIGHT KNEE WITHOUT AND WITH CONTRAST TECHNIQUE: Multiplanar, multisequence MR imaging of the knee was performed before and after the administration of intravenous contrast. CONTRAST:  9 cc MultiHance COMPARISON:  06/13/2015 FINDINGS: Despite efforts by the technologist and patient, motion artifact is present on today's exam and could not be eliminated. This reduces exam sensitivity and specificity. MENISCI Medial meniscus:  Unremarkable Lateral meniscus:  Unremarkable LIGAMENTS Cruciates:  Proximal PCL degeneration. Collaterals:  Mildly thickened MCL. CARTILAGE Patellofemoral: Suspected moderate chondral thinning and potential articular spurring along the medial patellar facet articular surface. Characterization problematic due to motion artifact. Medial: Suspected chondral thinning along the medial tibial plateau. Lateral:  Suspected mild chondral thinning. Joint: Small to moderate knee effusion with mild synovitis. Edema tracks in Hoffa's fat pad. Popliteal Fossa: There is considerable edema in the biceps femoris muscle, image 5 series 9. Edema also tracks in the  anterior compartment of the upper calf, and there is diffuse subcutaneous edema  around the knee especially posteriorly, laterally, and medially. Extensor Mechanism: Ossifications are present in the distal quadriceps tendon which also appears partially torn posteriorly in its vastus intermedius portion. Mild thickening of the patellar tendon. Suspected detachment of the medial patellar retinaculum from the medial patella, images 7 through 12 series 4. Poor definition of the lateral patellar retinaculum. Bones: There is abnormal edema superiorly in the patella, image 18 series 7, primarily anteriorly in the vicinity of the quadriceps fibrocartilage continuation and adjacent bone. IMPRESSION: 1. Distal posterior quadriceps appears torn. This likely corresponds to the vastus intermedius portion of the tendon. There is also edema in the upper margin of the patella with only very faint associated enhancement, likely reactive rather than due to osteomyelitis. Ossifications noted in the distal quadriceps tendon based on conventional radiography. Thickened patellar tendon. 2. Diffuse subcutaneous edema, nonspecific but a component of this may reflect cellulitis. 3. Small to moderate knee effusion but with only mild synovitis and without findings characteristic of septic joint. 4. Variable but mainly mild to moderate degrees of chondral thinning in the knee compartments. 5. Discontinuity medially in the medial patellar retinaculum compatible with tear. Indistinct lateral patellar retinaculum, nonspecific. 6. Despite efforts by the technologist and patient, motion artifact is present on today's exam and could not be eliminated. This reduces exam sensitivity and specificity. 7. Mildly thickened MCL could be a result of a prior injury. 8. Mild proximal PCL degeneration. Electronically Signed   By: Van Clines M.D.   On: 06/16/2015 19:42   Mr Knee Left W Wo Contrast  06/17/2015  CLINICAL DATA:  Cellulitis and swelling  left knee pain. Prior patellar removal. Difficulty moving the lower extremity. EXAM: MRI OF THE LEFT KNEE WITHOUT AND WITH CONTRAST TECHNIQUE: Multiplanar, multisequence MR imaging of the knee was performed before and after the administration of intravenous contrast. CONTRAST:  58m MULTIHANCE GADOBENATE DIMEGLUMINE 529 MG/ML IV SOLN COMPARISON:  06/13/2015 FINDINGS: MENISCI Medial meniscus: Diminutive, indistinct, and irregular posterior horn medially, likely torn but indistinct. Diminutive anterior horn, likely torn. Lateral meniscus: Very little in the way of recognizable meniscal tissue, with a thin band of tissue likely corresponding to the lateral meniscus noted along the periphery of the joint on most images. LIGAMENTS Cruciates: Abnormal slope and indistinctness of the ACL favoring ACL tear. PCL degeneration proximally. Collaterals: Highly indistinct proximal MCL, thickened and edematous, likely torn. Highly indistinct proximal fibular collateral ligament, at least a grade 2 sprain. Prominent tendinopathy or partial tearing of the proximal popliteus tendon. CARTILAGE Patellofemoral: The patella is absent. Femoral trochlear groove articular cartilage thin and indistinct. Medial: Severe chondral loss is specially anteriorly in the joint. Diffuse subcortical edema. Lateral:  Markedly severe chondral loss.  Diffuse subcortical edema. Joint: Markedly severe synovitis in Hoffa's fat pad and along the expected location of the suprapatellar bursa. Suspected inferior plica or band of fibrosis extending from the patellectomy site towards the ACL. Prominent synovitis posteriorly in the knee joint. Popliteal Fossa: Diffuse edema in the subcutaneous tissues and tracking within along the proximal gastrocnemius musculature, popliteus musculature, and proximal soleus musculature. There is also low-level edema in the anterior compartment musculature in the lower leg. Extensor Mechanism: Indistinct connection of the  quadriceps tendons to the patellectomy site, probably discontinuous. On images 17-18 there appears to be a fluid-filled potentially draining collection extending from the joint to the scan of the patellectomy site anteriorly. Severe surrounding synovitis. The thin residual patellar tendon appears to attach to several small bony ossicles but is not  thought to be providing a by mechanically significant connection to the quadriceps tendons. The retinacular highly indistinct and likely discontinuous. Bones: Prominent edema and enhancement in both femoral condyles and throughout the tibial plateau. IMPRESSION: 1. Markedly severe synovitis with possible draining track extending from the joint to the anterior skin surface in the vicinity of the patellectomy. There is prominent edema and enhancement in the femoral condyles and tibial plateau raising suspicion of a septic joint with osteomyelitis, although very little fluid to drain separate from the severe synovitis. 2. Highly indistinct quadriceps and proximal patellar tendon in the vicinity of the patellectomy, probably discontinuous and likely not by mechanically significant. The patellar retinacula likewise appear discontinuous/detached. 3. Abnormal ACL slope, quite likely torn. 4. Severe diminution in size and indistinctness of the lateral meniscus, favoring high-grade tearing. Diminutive size of portions of the medial meniscus compatible with meniscal tears. 5. Highly indistinct, thickened, and edematous proximal MCL, likely torn. Grade 2 sprain of the fibular collateral ligament and partial tearing or tendinopathy of the proximal popliteus tendon. 6. Severe loss of articular cartilage in the medial and lateral compartments (especially the lateral compartment). Thinning of the femoral trochlear groove articular cartilage. 7. There is a band of fibrosis extending to a medial plica from the patellectomy site. 8. Diffuse subcutaneous edema favoring cellulitis.  Electronically Signed   By: Van Clines M.D.   On: 06/17/2015 07:46         Subjective: Patient denies fevers, chills, headache, chest pain, dyspnea, nausea, vomiting, diarrhea, abdominal pain, dysuria, hematuria   Objective: Filed Vitals:   06/18/15 1412 06/18/15 2351 06/19/15 0502 06/19/15 1402  BP: 132/73 109/69 122/77 155/91  Pulse: 88 78 76 89  Temp: 97.6 F (36.4 C) 97.6 F (36.4 C) 97.5 F (36.4 C) 97.6 F (36.4 C)  TempSrc: Oral Oral Oral Oral  Resp: _0 Height:      Weight:      SpO2: 100% 97% 98% 100%    Intake/Output Summary (Last 24 hours) at 06/19/15 1859 Last data filed at 06/19/15 1700  Gross per 24 hour  Intake    180 ml  Output    200 ml  Net    -20 ml   Weight change:  Exam:   General:  Pt is alert, follows commands appropriately, not in acute distress  HEENT: No icterus, No thrush, No neck mass, [place]/AT  Cardiovascular: RRR, S1/S2, no rubs, no gallops  Respiratory: CTA bilaterally, no wheezing, no crackles, no rhonchi  Abdomen: Soft/+BS, non tender, non distended, no guarding  Extremities: No edema, No lymphangitis, No petechiae, No rashes, no synovitis  Data Reviewed: Basic Metabolic Panel:  Recent Labs Lab 06/13/15 1259 06/14/15 0304 06/16/15 0327 06/18/15 0636 06/19/15 0616  NA 141 141 141 140 141  K 4.0 3.9 3.6 3.6 3.9  CL 99* 107 106 106 110  CO2 _1 GLUCOSE 126* 80 80 64* 68  BUN 14 11 <5* 6 6  CREATININE 0.88 0.60* 0.50* 0.45* 0.52*  CALCIUM 8.8* 8.5* 8.4* 8.2* 8.1*   Liver Function Tests:  Recent Labs Lab 06/13/15 1259 06/14/15 0304  AST 27 27  ALT 9* 11*  ALKPHOS 89 71  BILITOT 1.2 0.6  PROT 7.0 5.9*  ALBUMIN 2.6* 2.2*   No results for input(s): LIPASE, AMYLASE in the last 168 hours. No results for input(s): AMMONIA in the last 168 hours. CBC:  Recent Labs Lab 06/13/15 1259 06/14/15 0304 06/16/15 0327 06/18/15 7289  WBC 13.4* 10.2 7.4 5.8  NEUTROABS 11.7*  --   --   --    HGB 11.7* 10.8* 11.0* 9.1*  HCT 38.9* 34.7* 35.5* 30.0*  MCV 81.6 80.1 80.3 81.1  PLT 561* 473* 357 363   Cardiac Enzymes: No results for input(s): CKTOTAL, CKMB, CKMBINDEX, TROPONINI in the last 168 hours. BNP: Invalid input(s): POCBNP CBG: No results for input(s): GLUCAP in the last 168 hours.  Recent Results (from the past 240 hour(s))  Culture, blood (routine x 2)     Status: None   Collection Time: 06/13/15 11:07 AM  Result Value Ref Range Status   Specimen Description BLOOD LEFT ANTECUBITAL  Final   Special Requests IN PEDIATRIC BOTTLE 4ML  Final   Culture   Final    NO GROWTH 5 DAYS Performed at [facility]    Report Status 06/18/2015 FINAL  Final  Culture, blood (routine x 2)     Status: None   Collection Time: 06/13/15 11:08 AM  Result Value Ref Range Status   Specimen Description BLOOD LEFT HAND  Final   Special Requests BOTTLES DRAWN AEROBIC AND ANAEROBIC 3ML  Final   Culture   Final    NO GROWTH 5 DAYS Performed at [facility]    Report Status 06/18/2015 FINAL  Final  Urine culture     Status: None   Collection Time: 06/13/15 12:55 PM  Result Value Ref Range Status   Specimen Description URINE, CLEAN CATCH  Final   Special Requests NONE  Final   Culture   Final    MULTIPLE SPECIES PRESENT, SUGGEST RECOLLECTION Performed at [facility]    Report Status 06/14/2015 FINAL  Final  C difficile quick scan w PCR reflex     Status: None   Collection Time: 06/13/15  4:26 PM  Result Value Ref Range Status   C Diff antigen NEGATIVE NEGATIVE Final   C Diff toxin NEGATIVE NEGATIVE Final   C Diff interpretation Negative for toxigenic C. difficile  Final  MRSA PCR Screening     Status: None   Collection Time: 06/13/15  5:46 PM  Result Value Ref Range Status   MRSA by PCR NEGATIVE NEGATIVE Final    Comment:        The GeneXpert MRSA Assay (FDA approved for NASAL specimens only), is one component of a comprehensive MRSA  colonization surveillance program. It is not intended to diagnose MRSA infection nor to guide or monitor treatment for MRSA infections.   Gastrointestinal Panel by PCR , Stool     Status: None   Collection Time: 06/14/15 11:50 AM  Result Value Ref Range Status   Campylobacter species NOT DETECTED NOT DETECTED Final   Plesimonas shigelloides NOT DETECTED NOT DETECTED Final   Salmonella species NOT DETECTED NOT DETECTED Final   Yersinia enterocolitica NOT DETECTED NOT DETECTED Final   Vibrio species NOT DETECTED NOT DETECTED Final   Vibrio cholerae NOT DETECTED NOT DETECTED Final   Enteroaggregative E coli (EAEC) NOT DETECTED NOT DETECTED Final   Enteropathogenic E coli (EPEC) NOT DETECTED NOT DETECTED Final   Enterotoxigenic E coli (ETEC) NOT DETECTED NOT DETECTED Final   Shiga like toxin producing E coli (STEC) NOT DETECTED NOT DETECTED Final   E. coli O157 NOT DETECTED NOT DETECTED Final   Shigella/Enteroinvasive E coli (EIEC) NOT DETECTED NOT DETECTED Final   Cryptosporidium NOT DETECTED NOT DETECTED Final   Cyclospora cayetanensis NOT DETECTED NOT DETECTED Final   Entamoeba histolytica NOT  DETECTED NOT DETECTED Final   Giardia lamblia NOT DETECTED NOT DETECTED Final   Adenovirus F40/41 NOT DETECTED NOT DETECTED Final   Astrovirus NOT DETECTED NOT DETECTED Final   Norovirus GI/GII NOT DETECTED NOT DETECTED Final   Rotavirus A NOT DETECTED NOT DETECTED Final   Sapovirus (I, II, IV, and V) NOT DETECTED NOT DETECTED Final  Culture, routine-abscess     Status: None (Preliminary result)   Collection Time: 06/17/15 10:45 AM  Result Value Ref Range Status   Specimen Description ABSCESS LEFT KNEE  Final   Special Requests Immunocompromised  Final   Gram Stain PENDING  Incomplete   Culture   Final    FEW STAPHYLOCOCCUS AUREUS Note: RIFAMPIN AND GENTAMICIN SHOULD NOT BE USED AS SINGLE DRUGS FOR TREATMENT OF STAPH INFECTIONS. Performed at [facility]    Report Status PENDING   Incomplete     Scheduled Meds: . antiseptic oral rinse  7 mL Mouth Rinse BID  .  ceFAZolin (ANCEF) IV  2 g Intravenous Q8H  . collagenase   Topical Daily  . divalproex  500 mg Oral BID  . docusate sodium  200 mg Oral QHS  . enoxaparin (LOVENOX) injection  40 mg Subcutaneous Q24H  . ferrous sulfate  325 mg Oral BID  . folic acid  1 mg Oral Daily  . ibuprofen  600 mg Oral 3 times per day  . multivitamin with minerals  1 tablet Oral Daily  . pantoprazole  40 mg Oral QAC breakfast  . sodium chloride flush  3 mL Intravenous Q12H  . thiamine  100 mg Oral Daily  . traZODone  25 mg Oral TID  . vitamin C  500 mg Oral 2 times per day   Continuous Infusions: . sodium chloride 0.9 % 1,000 mL with potassium chloride 20 mEq infusion 100 mL/hr at 06/19/15 1007     Evely Gainey, DO  [facility] Pager [phone]  If 7PM-7AM, please contact night-coverage [email or web address] Password [email or web address] 06/19/2015, 6:59 PM   LOS: 6 days

## 2015-06-20 DIAGNOSIS — A4102 Sepsis due to Methicillin resistant Staphylococcus aureus: Secondary | ICD-10-CM

## 2015-06-20 LAB — BASIC METABOLIC PANEL
Anion gap: 6 (ref 5–15)
CALCIUM: 7.9 mg/dL — AB (ref 8.9–10.3)
CO2: 24 mmol/L (ref 22–32)
CREATININE: 0.46 mg/dL — AB (ref 0.61–1.24)
Chloride: 112 mmol/L — ABNORMAL HIGH (ref 101–111)
GFR calc non Af Amer: >60 mL/min (ref >60)
Glucose, Bld: 65 mg/dL (ref 65–99)
Potassium: 4 mmol/L (ref 3.5–5.1)
SODIUM: 142 mmol/L (ref 135–145)

## 2015-06-20 LAB — CULTURE, ROUTINE-ABSCESS: Gram Stain: NONE SEEN

## 2015-06-20 MED ORDER — SODIUM CHLORIDE 0.9% FLUSH
10.0000 mL | INTRAVENOUS | Status: DC | PRN
  Administered 2015-06-21 (×2): 10 mL
  Filled 2015-06-20 (×2): qty 40

## 2015-06-20 MED ORDER — SODIUM CHLORIDE 0.9 % IV SOLN
500.0000 mg | Freq: Two times a day (BID) | INTRAVENOUS | Status: DC
  Administered 2015-06-20 – 2015-06-21 (×3): 500 mg via INTRAVENOUS
  Filled 2015-06-20 (×4): qty 500

## 2015-06-20 NOTE — Progress Notes (Signed)
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 Arbutus Leasat  Time of first page:  0900  MD notified (2nd page):  Time of second page:  Responding MD:    Time MD responded:

## 2015-06-20 NOTE — Progress Notes (Signed)
Pharmacy Antibiotic Note  Timothy DubinWarren D Szymanowski is a 65 y.o. male with hx bilateral knee injury with wound being managed at wound center [facility].  Knee x-ray on 2/27 showed soft tissue swelling with concern for an infectious process.  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  Patient was previously on vancomycin and cefepime but abx de-escalated to ancef on 3/5 with plan to treat for 6 weeks.  Knee abscess culture now back with MRSA.  To resume vancomycin back for patient.  Plan: - vancomycin 500 mg IV q12h - will check vancomycin level at steady state  Height: 6\' 2"  (188 cm) Weight: 109 lb 9.1 oz (49.7 kg) IBW/kg (Calculated) : 82.2  Temp (24hrs), Avg:97.5 F (36.4 C), Min:97.4 F (36.3 C), Max:97.6 F (36.4 C)   Recent Labs Lab 06/13/15 1119  06/13/15 1259 06/13/15 1500 06/13/15 1754 06/13/15 2101 06/14/15 0304 06/14/15 1729 06/16/15 0327 06/18/15 0636 06/19/15 0616 06/20/15 0550  WBC  --   --  13.4*  --   --   --  10.2  --  7.4 5.8  --   --   CREATININE  --   < > 0.88  --   --   --  0.60*  --  0.50* 0.45* 0.52* 0.46*  LATICACIDVEN 5.92*  --   --  2.54* 2.4* 2.6*  --  1.5  --   --   --   --   < > = values in this interval not displayed.  Estimated Creatinine Clearance: 64.7 mL/min (by C-G formula based on Cr of 0.46).    No Known Allergies  Antimicrobials this admission: 2/27 vancomycin >>  3/2>> resume 3/6>> 2/27 cefepime >> 3/5 3/5 Ancef >>3/6  Dose adjustments this admission: n/a  Microbiology results: 2/28 GI panel PCR: negative 2/27 MRSA PCR: negative 2/27 BCx x2: neg FINAL 2/27 UCx:  Suggest recollection -(U/A cloudy, few bacteria, ketones, leukocytes, nitrites, WBC) 2/27 C diff: negative Hx pseudomonas, MSSA in knee joint; MSSA, CoNS, and S bovis in blood from 10/2014 3/3 knee aspirate abscesss: few MRSA FINAL  Thank you for allowing pharmacy to be a part of  this patient's care.  Lucia Gaskinsham, Tayquan Gassman P 06/20/2015 9:20 AM

## 2015-06-20 NOTE — Progress Notes (Signed)
Pt. Refused drsg changes this pm stated that they had already been changed, however I received report from AM nurse that the dressings had not been done. drsgs remain clean dry and intact no drainage noted.

## 2015-06-20 NOTE — Progress Notes (Signed)
PROGRESS NOTE  Timothy Norman DGU:[id number] DOB: Feb 27, 1951 DOA: 06/13/2015 PCP: [name], MD   Brief narrative: Timothy Norman is a 65 y.o. male with history of extensive bilateral knee injury secondary to an accident under care of Dr. [name] at the wound center. He is been living in a nursing facility and presents for hypotension and tachycardia. He is a poor historian and proper history is unobtainable. He did admit on admission to having diarrhea in 2 days of weakness. He was also noted to be having pain while urinating in the ER. UA had pyuria and nitrites, and it was suspected he may have a UTI. He was also found to have lactic acidosis.   Assessment/Plan: Sepsis ([place]) -Initially thought to be due to UTI, but most likely due to infected L-knee -blood cultures remain negative -h/o b/l septic knees-s/p debridement 10/26/14 with MSSA and Pseudomonas -Xrays of bilateral knees reveals soft tissue swelling in the left knee "concerning for an infectious process"-there are also some concerns of possible osteomyelitis -orthopedics Dr. [name] evaluated the patient as he performed surgery on his knee last year--pt refused to allow Dr. [name] to fully examine him -06/15/2015 CT abdomen pelvis negative for acute findings or bony abnormalities -06/15/2015 CT chest negative for infiltrates or masses -ESR--43, CRP--5.2 -MRI of knees-L-knee with appearance of synovitis from the joint to the anterior skin surface with enhancement of the femoral condyle and tibial plateau concerning for osteomyelitis or septic joint -d/c vancomycin -06/17/15-case discussed with ortho, Dr. [name], who felt that another debridement would not be fruitful as infection would recur in presence of fistulous tract--stated that pt needs L-AKA or tx with abx long term with possible flap in future -pt refuses AKA at this time -06/17/15-case discussed with IR, Dr. [name] try to obtain fluid or swab tract -please send  any fluid for cell count -place PICC and tx L-knee infection--plan 4-6 weeks abx--pt refused 06/18/15--after discussion with pt, pt now agreeable  Osteomyelitis of left knee -06/18/15 culture from IR--staph aureus -d/c cefepime -d/c cefazolin--start vancomycin -06/17/15 knee culture--MRSA -plan 6 weeks IV abx -will need plastics re-eval after abx for flap -06/20/15--PICC line -PT eval  Labile BP -am cortisol--14.8 -?dysautonomia -BP overall improving, but still having periods of lability  Diarrhea -improved -C. Diff and GI pathogen panel neg  Wounds, multiple - no external signs of infection-  -Appreciate wound care evaluation -right gluteal wound unstageable without purulent drainage or surrounding cellulitis  Pericardial effusion -06/15/15--Echocardiogram--moderate pericardial effusion with EF 95-63%, grade 1 diastolic dysfunction, no WMA; no hemodynamic compromise and -pt had trivial pericardial effusion on 10/30/14 -unclear reason for increase in pericardial effusion -appreciate cardiology--no indication for invasive procedure presently -admission EKG without ST changes -trial of ibuprofen--monitor renal function   Encephalopathy - not certain of his baseline, but quite lucid on 06/16/15  -appears to be back to baseline 11/21/54 - not certain if he has an underlying psych diagnosis as he is on Depakote and Trazodone  Severe protein calorie malnutrition -continue supplementa  Family Communication: No family at beside Disposition Plan: SNF  3/7   Procedures/Studies: Dg Chest 2 View  06/13/2015  CLINICAL DATA:  Increased weakness. EXAM: CHEST  2 VIEW COMPARISON:  October 26, 2014. FINDINGS: The heart size and mediastinal contours are within normal limits. No pneumothorax or pleural effusion is noted. Left lung is clear. Mild right basilar atelectasis or scarring is noted with associated nodular density. Probable old left rib  fracture is noted. IMPRESSION: Mild right basilar  atelectasis or scarring is noted with probable nodular density. CT scan of the chest is recommended to rule out mass or neoplasm. Electronically Signed   By: Marijo Conception, M.D.   On: 06/13/2015 11:59   Dg Knee 1-2 Views Left  06/13/2015  CLINICAL DATA:  Patient with sepsis. Patient reports no pain. Recent history patellectomy. EXAM: LEFT KNEE - 1-2 VIEW COMPARISON:  Knee radiograph 10/26/2014 FINDINGS: Interval removal of the patella. There is extensive swelling about the knee joint. Limited evaluation for cortical irregularity. Multiple new ossific densities are demonstrated within the overlying soft tissues. Markedly limited exam secondary to poor patient positioning as the patient would not cooperate with the radiographs. IMPRESSION: Marked soft tissue swelling about the knee concerning for an infectious process. Overall the exam is markedly limited secondary to difficulty with patient positioning as the patient did not cooperate with the examination. Recommend repeat evaluation when patient is clinically able. Multiple new calcific densities about the knee joint which for nonspecific may represent heterotopic calcification versus sequelae of infection. Electronically Signed   By: Lovey Newcomer M.D.   On: 06/13/2015 14:44   Dg Knee 1-2 Views Right  06/13/2015  CLINICAL DATA:  Sepsis with unknown source EXAM: RIGHT KNEE - 1-2 VIEW COMPARISON:  October 26, 2014 FINDINGS: Frontal and lateral views were obtained. There is no acute fracture or dislocation. There is no appreciable knee joint effusion. There is calcification within the knee joint anteriorly and superiorly, possibly residua of old trauma or infection. The bones appear somewhat osteoporotic. No well-defined bony destruction is appreciable. There is slight narrowing medially and in the patellofemoral joint region. There are foci of arterial vascular calcification present. IMPRESSION: No well-defined bony destruction. Periarticular osteoporosis,  particularly in the distal medial femoral condyle, could mask a degree of subtle osteomyelitis. Calcification in the knee joint is probably due to residua of either old trauma or infection. There is no acute fracture or dislocation. No joint effusion evident. There are foci of atherosclerotic calcification. If there remains concern for potential osteomyelitis, either MRI or three-phase nuclear medicine bone scan potentially could be helpful for further assessment. Electronically Signed   By: Lowella Grip III M.D.   On: 06/13/2015 14:38   Ct Chest W Contrast  06/15/2015  CLINICAL DATA:  Sepsis. Urinary tract infection. Hypotension. Initial encounter. EXAM: CT CHEST, ABDOMEN, AND PELVIS WITH CONTRAST TECHNIQUE: Multidetector CT imaging of the chest, abdomen and pelvis was performed following the standard protocol during bolus administration of intravenous contrast. CONTRAST:  100 mL OMNIPAQUE IOHEXOL 300 MG/ML  SOLN COMPARISON:  PA and lateral chest 06/13/2015. FINDINGS: CT CHEST The patient has small bilateral pleural effusions. Moderate pericardial effusion is also seen. Heart size is normal. Calcific aortic and coronary atherosclerosis is identified. No axillary, hilar or mediastinal lymphadenopathy. Lungs demonstrate some linear atelectasis in the right lower lobe. There is also mild dependent atelectasis. The lungs are otherwise clear. No lytic or sclerotic bony lesion is seen. Remote lower left rib fractures are incidentally noted. CT ABDOMEN AND PELVIS The gallbladder, spleen, adrenal glands, kidneys, pancreas and biliary tree all appear normal. A 0.9 cm hypo attenuating lesion in the dome of the liver is compatible with a cyst. The liver is otherwise unremarkable. There is aortoiliac atherosclerosis without aneurysm. Body wall edema is noted. The prostate gland is mildly prominent. Urinary bladder and seminal vesicles are unremarkable. Prominent stool in the rectosigmoid colon is noted. The stomach and  small bowel are unremarkable. There is no lymphadenopathy. Trace amount of free pelvic fluid is noted. No organized fluid collection. No focal bony abnormality. IMPRESSION: No source for the patient's infection is identified. Very small bilateral pleural effusions with a small to moderate pericardial effusion also seen. Calcific aortic and coronary atherosclerosis. Prominent stool burden in the rectosigmoid colon. Mild enlargement of prostate gland. Body wall edema and trace amount of free pelvic fluid compatible with anasarca. Electronically Signed   By: Inge Rise M.D.   On: 06/15/2015 13:45   Ct Abdomen Pelvis W Contrast  06/15/2015  CLINICAL DATA:  Sepsis. Urinary tract infection. Hypotension. Initial encounter. EXAM: CT CHEST, ABDOMEN, AND PELVIS WITH CONTRAST TECHNIQUE: Multidetector CT imaging of the chest, abdomen and pelvis was performed following the standard protocol during bolus administration of intravenous contrast. CONTRAST:  100 mL OMNIPAQUE IOHEXOL 300 MG/ML  SOLN COMPARISON:  PA and lateral chest 06/13/2015. FINDINGS: CT CHEST The patient has small bilateral pleural effusions. Moderate pericardial effusion is also seen. Heart size is normal. Calcific aortic and coronary atherosclerosis is identified. No axillary, hilar or mediastinal lymphadenopathy. Lungs demonstrate some linear atelectasis in the right lower lobe. There is also mild dependent atelectasis. The lungs are otherwise clear. No lytic or sclerotic bony lesion is seen. Remote lower left rib fractures are incidentally noted. CT ABDOMEN AND PELVIS The gallbladder, spleen, adrenal glands, kidneys, pancreas and biliary tree all appear normal. A 0.9 cm hypo attenuating lesion in the dome of the liver is compatible with a cyst. The liver is otherwise unremarkable. There is aortoiliac atherosclerosis without aneurysm. Body wall edema is noted. The prostate gland is mildly prominent. Urinary bladder and seminal vesicles are unremarkable.  Prominent stool in the rectosigmoid colon is noted. The stomach and small bowel are unremarkable. There is no lymphadenopathy. Trace amount of free pelvic fluid is noted. No organized fluid collection. No focal bony abnormality. IMPRESSION: No source for the patient's infection is identified. Very small bilateral pleural effusions with a small to moderate pericardial effusion also seen. Calcific aortic and coronary atherosclerosis. Prominent stool burden in the rectosigmoid colon. Mild enlargement of prostate gland. Body wall edema and trace amount of free pelvic fluid compatible with anasarca. Electronically Signed   By: Inge Rise M.D.   On: 06/15/2015 13:45   Mr Knee Right W Wo Contrast  06/16/2015  CLINICAL DATA:  Knee pain, swelling, and cellulitis, especially anteriorly. EXAM: MRI OF THE RIGHT KNEE WITHOUT AND WITH CONTRAST TECHNIQUE: Multiplanar, multisequence MR imaging of the knee was performed before and after the administration of intravenous contrast. CONTRAST:  9 cc MultiHance COMPARISON:  06/13/2015 FINDINGS: Despite efforts by the technologist and patient, motion artifact is present on today's exam and could not be eliminated. This reduces exam sensitivity and specificity. MENISCI Medial meniscus:  Unremarkable Lateral meniscus:  Unremarkable LIGAMENTS Cruciates:  Proximal PCL degeneration. Collaterals:  Mildly thickened MCL. CARTILAGE Patellofemoral: Suspected moderate chondral thinning and potential articular spurring along the medial patellar facet articular surface. Characterization problematic due to motion artifact. Medial: Suspected chondral thinning along the medial tibial plateau. Lateral:  Suspected mild chondral thinning. Joint: Small to moderate knee effusion with mild synovitis. Edema tracks in Hoffa's fat pad. Popliteal Fossa: There is considerable edema in the biceps femoris muscle, image 5 series 9. Edema also tracks in the anterior compartment of the upper calf, and there is  diffuse subcutaneous edema around the knee especially posteriorly, laterally, and medially. Extensor Mechanism: Ossifications are present in the distal quadriceps  tendon which also appears partially torn posteriorly in its vastus intermedius portion. Mild thickening of the patellar tendon. Suspected detachment of the medial patellar retinaculum from the medial patella, images 7 through 12 series 4. Poor definition of the lateral patellar retinaculum. Bones: There is abnormal edema superiorly in the patella, image 18 series 7, primarily anteriorly in the vicinity of the quadriceps fibrocartilage continuation and adjacent bone. IMPRESSION: 1. Distal posterior quadriceps appears torn. This likely corresponds to the vastus intermedius portion of the tendon. There is also edema in the upper margin of the patella with only very faint associated enhancement, likely reactive rather than due to osteomyelitis. Ossifications noted in the distal quadriceps tendon based on conventional radiography. Thickened patellar tendon. 2. Diffuse subcutaneous edema, nonspecific but a component of this may reflect cellulitis. 3. Small to moderate knee effusion but with only mild synovitis and without findings characteristic of septic joint. 4. Variable but mainly mild to moderate degrees of chondral thinning in the knee compartments. 5. Discontinuity medially in the medial patellar retinaculum compatible with tear. Indistinct lateral patellar retinaculum, nonspecific. 6. Despite efforts by the technologist and patient, motion artifact is present on today's exam and could not be eliminated. This reduces exam sensitivity and specificity. 7. Mildly thickened MCL could be a result of a prior injury. 8. Mild proximal PCL degeneration. Electronically Signed   By: Van Clines M.D.   On: 06/16/2015 19:42   Mr Knee Left W Wo Contrast  06/17/2015  CLINICAL DATA:  Cellulitis and swelling left knee pain. Prior patellar removal. Difficulty  moving the lower extremity. EXAM: MRI OF THE LEFT KNEE WITHOUT AND WITH CONTRAST TECHNIQUE: Multiplanar, multisequence MR imaging of the knee was performed before and after the administration of intravenous contrast. CONTRAST:  63m MULTIHANCE GADOBENATE DIMEGLUMINE 529 MG/ML IV SOLN COMPARISON:  06/13/2015 FINDINGS: MENISCI Medial meniscus: Diminutive, indistinct, and irregular posterior horn medially, likely torn but indistinct. Diminutive anterior horn, likely torn. Lateral meniscus: Very little in the way of recognizable meniscal tissue, with a thin band of tissue likely corresponding to the lateral meniscus noted along the periphery of the joint on most images. LIGAMENTS Cruciates: Abnormal slope and indistinctness of the ACL favoring ACL tear. PCL degeneration proximally. Collaterals: Highly indistinct proximal MCL, thickened and edematous, likely torn. Highly indistinct proximal fibular collateral ligament, at least a grade 2 sprain. Prominent tendinopathy or partial tearing of the proximal popliteus tendon. CARTILAGE Patellofemoral: The patella is absent. Femoral trochlear groove articular cartilage thin and indistinct. Medial: Severe chondral loss is specially anteriorly in the joint. Diffuse subcortical edema. Lateral:  Markedly severe chondral loss.  Diffuse subcortical edema. Joint: Markedly severe synovitis in Hoffa's fat pad and along the expected location of the suprapatellar bursa. Suspected inferior plica or band of fibrosis extending from the patellectomy site towards the ACL. Prominent synovitis posteriorly in the knee joint. Popliteal Fossa: Diffuse edema in the subcutaneous tissues and tracking within along the proximal gastrocnemius musculature, popliteus musculature, and proximal soleus musculature. There is also low-level edema in the anterior compartment musculature in the lower leg. Extensor Mechanism: Indistinct connection of the quadriceps tendons to the patellectomy site, probably  discontinuous. On images 17-18 there appears to be a fluid-filled potentially draining collection extending from the joint to the scan of the patellectomy site anteriorly. Severe surrounding synovitis. The thin residual patellar tendon appears to attach to several small bony ossicles but is not thought to be providing a by mechanically significant connection to the quadriceps tendons. The retinacular highly indistinct  and likely discontinuous. Bones: Prominent edema and enhancement in both femoral condyles and throughout the tibial plateau. IMPRESSION: 1. Markedly severe synovitis with possible draining track extending from the joint to the anterior skin surface in the vicinity of the patellectomy. There is prominent edema and enhancement in the femoral condyles and tibial plateau raising suspicion of a septic joint with osteomyelitis, although very little fluid to drain separate from the severe synovitis. 2. Highly indistinct quadriceps and proximal patellar tendon in the vicinity of the patellectomy, probably discontinuous and likely not by mechanically significant. The patellar retinacula likewise appear discontinuous/detached. 3. Abnormal ACL slope, quite likely torn. 4. Severe diminution in size and indistinctness of the lateral meniscus, favoring high-grade tearing. Diminutive size of portions of the medial meniscus compatible with meniscal tears. 5. Highly indistinct, thickened, and edematous proximal MCL, likely torn. Grade 2 sprain of the fibular collateral ligament and partial tearing or tendinopathy of the proximal popliteus tendon. 6. Severe loss of articular cartilage in the medial and lateral compartments (especially the lateral compartment). Thinning of the femoral trochlear groove articular cartilage. 7. There is a band of fibrosis extending to a medial plica from the patellectomy site. 8. Diffuse subcutaneous edema favoring cellulitis. Electronically Signed   By: Van Clines M.D.   On:  06/17/2015 07:46         Subjective: Patient denies fevers, chills, headache, chest pain, dyspnea, nausea, vomiting, diarrhea, abdominal pain, dysuria, hematuria   Objective: Filed Vitals:   06/19/15 2136 06/20/15 0609 06/20/15 0900 06/20/15 1500  BP: 125/72 110/69 109/67 122/69  Pulse: 81 64 72 77  Temp: 97.5 F (36.4 C) 97.4 F (36.3 C) 98.5 F (36.9 C) 97.8 F (36.6 C)  TempSrc: Oral Oral Oral Axillary  Resp: _0 Height:      Weight:      SpO2: 100% 100% 100% 100%    Intake/Output Summary (Last 24 hours) at 06/20/15 1835 Last data filed at 06/20/15 1500  Gross per 24 hour  Intake      0 ml  Output    150 ml  Net   -150 ml   Weight change:  Exam:   General:  Pt is alert, follows commands appropriately, not in acute distress  HEENT: No icterus, No thrush, No neck mass, [place]/AT  Cardiovascular: RRR, S1/S2, no rubs, no gallops  Respiratory: CTA bilaterally, no wheezing, no crackles, no rhonchi  Abdomen: Soft/+BS, non tender, non distended, no guarding Extremities: Left knee with small amount of yellow drainage. Regarding her situation should. No lymphangitis or crepitance. Mild tenderness to palpation. 1+ edema bilateral lower extremities without any cyanosis. Data Reviewed: Basic Metabolic Panel:  Recent Labs Lab 06/14/15 0304 06/16/15 0327 06/18/15 0636 06/19/15 0616 06/20/15 0550  NA 141 141 140 141 142  K 3.9 3.6 3.6 3.9 4.0  CL 107 106 106 110 112*  CO2 _1 GLUCOSE 80 80 64* 68 65  BUN 11 <5* 6 6 <5*  CREATININE 0.60* 0.50* 0.45* 0.52* 0.46*  CALCIUM 8.5* 8.4* 8.2* 8.1* 7.9*   Liver Function Tests:  Recent Labs Lab 06/14/15 0304  AST 27  ALT 11*  ALKPHOS 71  BILITOT 0.6  PROT 5.9*  ALBUMIN 2.2*   No results for input(s): LIPASE, AMYLASE in the last 168 hours. No results for input(s): AMMONIA in the last 168 hours. CBC:  Recent Labs Lab 06/14/15 0304 06/16/15 0327 06/18/15 0636  WBC 10.2 7.4 5.8  HGB  10.8* 11.0*  9.1*  HCT 34.7* 35.5* 30.0*  MCV 80.1 80.3 81.1  PLT 473* 357 363   Cardiac Enzymes: No results for input(s): CKTOTAL, CKMB, CKMBINDEX, TROPONINI in the last 168 hours. BNP: Invalid input(s): POCBNP CBG: No results for input(s): GLUCAP in the last 168 hours.  Recent Results (from the past 240 hour(s))  Culture, blood (routine x 2)     Status: None   Collection Time: 06/13/15 11:07 AM  Result Value Ref Range Status   Specimen Description BLOOD LEFT ANTECUBITAL  Final   Special Requests IN PEDIATRIC BOTTLE 4ML  Final   Culture   Final    NO GROWTH 5 DAYS Performed at [facility]    Report Status 06/18/2015 FINAL  Final  Culture, blood (routine x 2)     Status: None   Collection Time: 06/13/15 11:08 AM  Result Value Ref Range Status   Specimen Description BLOOD LEFT HAND  Final   Special Requests BOTTLES DRAWN AEROBIC AND ANAEROBIC 3ML  Final   Culture   Final    NO GROWTH 5 DAYS Performed at [facility]    Report Status 06/18/2015 FINAL  Final  Urine culture     Status: None   Collection Time: 06/13/15 12:55 PM  Result Value Ref Range Status   Specimen Description URINE, CLEAN CATCH  Final   Special Requests NONE  Final   Culture   Final    MULTIPLE SPECIES PRESENT, SUGGEST RECOLLECTION Performed at [facility]    Report Status 06/14/2015 FINAL  Final  C difficile quick scan w PCR reflex     Status: None   Collection Time: 06/13/15  4:26 PM  Result Value Ref Range Status   C Diff antigen NEGATIVE NEGATIVE Final   C Diff toxin NEGATIVE NEGATIVE Final   C Diff interpretation Negative for toxigenic C. difficile  Final  MRSA PCR Screening     Status: None   Collection Time: 06/13/15  5:46 PM  Result Value Ref Range Status   MRSA by PCR NEGATIVE NEGATIVE Final    Comment:        The GeneXpert MRSA Assay (FDA approved for NASAL specimens only), is one component of a comprehensive MRSA colonization surveillance program. It is  not intended to diagnose MRSA infection nor to guide or monitor treatment for MRSA infections.   Gastrointestinal Panel by PCR , Stool     Status: None   Collection Time: 06/14/15 11:50 AM  Result Value Ref Range Status   Campylobacter species NOT DETECTED NOT DETECTED Final   Plesimonas shigelloides NOT DETECTED NOT DETECTED Final   Salmonella species NOT DETECTED NOT DETECTED Final   Yersinia enterocolitica NOT DETECTED NOT DETECTED Final   Vibrio species NOT DETECTED NOT DETECTED Final   Vibrio cholerae NOT DETECTED NOT DETECTED Final   Enteroaggregative E coli (EAEC) NOT DETECTED NOT DETECTED Final   Enteropathogenic E coli (EPEC) NOT DETECTED NOT DETECTED Final   Enterotoxigenic E coli (ETEC) NOT DETECTED NOT DETECTED Final   Shiga like toxin producing E coli (STEC) NOT DETECTED NOT DETECTED Final   E. coli O157 NOT DETECTED NOT DETECTED Final   Shigella/Enteroinvasive E coli (EIEC) NOT DETECTED NOT DETECTED Final   Cryptosporidium NOT DETECTED NOT DETECTED Final   Cyclospora cayetanensis NOT DETECTED NOT DETECTED Final   Entamoeba histolytica NOT DETECTED NOT DETECTED Final   Giardia lamblia NOT DETECTED NOT DETECTED Final   Adenovirus F40/41 NOT DETECTED NOT DETECTED Final   Astrovirus NOT  DETECTED NOT DETECTED Final   Norovirus GI/GII NOT DETECTED NOT DETECTED Final   Rotavirus A NOT DETECTED NOT DETECTED Final   Sapovirus (I, II, IV, and V) NOT DETECTED NOT DETECTED Final  Culture, routine-abscess     Status: None   Collection Time: 06/17/15 10:45 AM  Result Value Ref Range Status   Specimen Description ABSCESS LEFT KNEE  Final   Special Requests Immunocompromised  Final   Gram Stain   Final    NO WBC SEEN NO SQUAMOUS EPITHELIAL CELLS SEEN RARE GRAM POSITIVE COCCI IN PAIRS Performed at [facility]    Culture   Final    FEW METHICILLIN RESISTANT STAPHYLOCOCCUS AUREUS Note: RIFAMPIN AND GENTAMICIN SHOULD NOT BE USED AS SINGLE DRUGS FOR TREATMENT OF STAPH  INFECTIONS. CRITICAL RESULT CALLED TO, READ BACK BY AND VERIFIED WITH: MORGAN EAST RN 06/20/15 AT 850 AM BY [facility] Performed at [facility]    Report Status 06/20/2015 FINAL  Final   Organism ID, Bacteria METHICILLIN RESISTANT STAPHYLOCOCCUS AUREUS  Final      Susceptibility   Methicillin resistant staphylococcus aureus - MIC*    CLINDAMYCIN >=8 RESISTANT Resistant     ERYTHROMYCIN >=8 RESISTANT Resistant     GENTAMICIN <=0.5 SENSITIVE Sensitive     LEVOFLOXACIN >=8 RESISTANT Resistant     OXACILLIN >=4 RESISTANT Resistant     RIFAMPIN <=0.5 SENSITIVE Sensitive     TRIMETH/SULFA <=10 SENSITIVE Sensitive     VANCOMYCIN 1 SENSITIVE Sensitive     TETRACYCLINE 2 SENSITIVE Sensitive     * FEW METHICILLIN RESISTANT STAPHYLOCOCCUS AUREUS     Scheduled Meds: . antiseptic oral rinse  7 mL Mouth Rinse BID  . collagenase   Topical Daily  . divalproex  500 mg Oral BID  . docusate sodium  200 mg Oral QHS  . enoxaparin (LOVENOX) injection  40 mg Subcutaneous Q24H  . ferrous sulfate  325 mg Oral BID  . folic acid  1 mg Oral Daily  . ibuprofen  600 mg Oral 3 times per day  . multivitamin with minerals  1 tablet Oral Daily  . pantoprazole  40 mg Oral QAC breakfast  . sodium chloride flush  3 mL Intravenous Q12H  . thiamine  100 mg Oral Daily  . traZODone  25 mg Oral TID  . vancomycin  500 mg Intravenous Q12H  . vitamin C  500 mg Oral 2 times per day   Continuous Infusions: . sodium chloride 0.9 % 1,000 mL with potassium chloride 20 mEq infusion 100 mL/hr at 06/19/15 2154     Allexis Bordenave, DO  [facility] Pager [phone]  If 7PM-7AM, please contact night-coverage [email or web address] Password [email or web address] 06/20/2015, 6:35 PM   LOS: 7 days

## 2015-06-20 NOTE — Progress Notes (Signed)
Peripherally Inserted Central Catheter/Midline Placement  The IV Nurse has discussed with the patient and/or persons authorized to consent for the patient, the purpose of this procedure and the potential benefits and risks involved with this procedure.  The benefits include less needle sticks, lab draws from the catheter and patient may be discharged home with the catheter.  Risks include, but not limited to, infection, bleeding, blood clot (thrombus formation), and puncture of an artery; nerve damage and irregular heat beat.  Alternatives to this procedure were also discussed.  PICC/Midline Placement Documentation        Stacie GlazeJoyce, Kathia Covington Horton 06/20/2015, 4:23 PM

## 2015-06-21 LAB — BASIC METABOLIC PANEL
Anion gap: 5 (ref 5–15)
BUN: <5 mg/dL — ABNORMAL LOW (ref 6–20)
CHLORIDE: 110 mmol/L (ref 101–111)
CO2: 24 mmol/L (ref 22–32)
CREATININE: 0.55 mg/dL — AB (ref 0.61–1.24)
Calcium: 7.8 mg/dL — ABNORMAL LOW (ref 8.9–10.3)
Glucose, Bld: 57 mg/dL — ABNORMAL LOW (ref 65–99)
POTASSIUM: 4.5 mmol/L (ref 3.5–5.1)
SODIUM: 139 mmol/L (ref 135–145)

## 2015-06-21 MED ORDER — COLLAGENASE 250 UNIT/GM EX OINT: TOPICAL_OINTMENT | Freq: Every day | CUTANEOUS | Status: AC

## 2015-06-21 MED ORDER — OXYCODONE HCL 5 MG PO TABS: 5.0000 mg | ORAL_TABLET | Freq: Four times a day (QID) | ORAL | Status: AC | PRN

## 2015-06-21 MED ORDER — HEPARIN SOD (PORK) LOCK FLUSH 100 UNIT/ML IV SOLN
250.0000 [IU] | INTRAVENOUS | Status: DC | PRN
  Administered 2015-06-21: 250 [IU]
  Filled 2015-06-21: qty 3

## 2015-06-21 MED ORDER — DOCUSATE SODIUM 100 MG PO CAPS: 200.0000 mg | ORAL_CAPSULE | Freq: Every day | ORAL | Status: AC

## 2015-06-21 MED ORDER — IBUPROFEN 600 MG PO TABS: 600.0000 mg | ORAL_TABLET | Freq: Three times a day (TID) | ORAL | Status: AC

## 2015-06-21 MED ORDER — HEPARIN SOD (PORK) LOCK FLUSH 100 UNIT/ML IV SOLN
250.0000 [IU] | Freq: Every day | INTRAVENOUS | Status: DC
  Administered 2015-06-21: 250 [IU]
  Filled 2015-06-21: qty 3

## 2015-06-21 MED ORDER — VANCOMYCIN HCL 500 MG IV SOLR: 500.0000 mg | Freq: Two times a day (BID) | INTRAVENOUS | Status: AC

## 2015-06-21 NOTE — Care Management Important Message (Signed)
Important Message  Patient Details  Name: Timothy Norman MRN: [id number] Date of Birth: 11/23/1950   Medicare Important Message Given:  Yes    Haskell FlirtJamison, Jahron Hunsinger 06/21/2015, 2:33 PMImportant Message  Patient Details  Name: Timothy DubinWarren D Norman MRN: [id number] Date of Birth: 04/29/1950   Medicare Important Message Given:  Yes    Haskell FlirtJamison, Sylar Voong 06/21/2015, 2:33 PM

## 2015-06-21 NOTE — NC FL2 (Signed)
[place] MEDICAID FL2 LEVEL OF CARE SCREENING TOOL     IDENTIFICATION  Patient Name: RAVEN HARMES Birthdate: 05-May-1950 Sex: male Admission Date (Current Location): 06/13/2015  [facility] and [place] Number:  Producer, television/film/video and Address:  [facility],  [address] [place] [address]      Provider Number: [id number]  Attending Physician Name and Address:  Catarina Hartshorn, MD  Relative Name and Phone Number:       Current Level of Care: Hospital Recommended Level of Care: Skilled Nursing Facility Prior Approval Number:    Date Approved/Denied:   PASRR Number: [id number]  Discharge Plan: SNF    Current Diagnoses: Patient Active Problem List   Diagnosis Date Noted  . Chronic osteomyelitis of knee (HCC) 06/18/2015  . Pressure ulcer 06/16/2015  . Pericardial effusion 06/15/2015  . Urinary tract infection 06/13/2015  . Sepsis (HCC) 06/13/2015  . Dehydration 06/13/2015  . Diarrhea 06/13/2015  . Pseudomonas infection   . Septic arthritis of knee, right (HCC) 10/29/2014  . Altered mental status   . Alcohol abuse 10/27/2014  . Acute renal insufficiency 10/27/2014  . Polysubstance abuse 10/27/2014  . Poor dentition 10/27/2014  . Left rib fracture 10/27/2014  . Septic arthritis of knee, left (HCC) 10/27/2014  . ETOH abuse 10/27/2014  . Wound abscess   . Encephalopathy   . Staphylococcus aureus bacteremia   . Acute blood loss anemia   . Wounds, multiple 10/26/2014    Orientation RESPIRATION BLADDER Height & Weight     Self, Situation, Place  Normal Continent Weight: 109 lb 9.1 oz (49.7 kg) Height:   (188 cm)  BEHAVIORAL SYMPTOMS/MOOD NEUROLOGICAL BOWEL NUTRITION STATUS   (Pt is cooperative during this visit )   Incontinent Diet (Soft diet and fluid consistency thin )  AMBULATORY STATUS COMMUNICATION OF NEEDS Skin   Extensive Assist Verbally  (ulcers bilater knees and sacral )                       Personal Care Assistance Level of  Assistance  Bathing, Feeding, Dressing Bathing Assistance: Maximum assistance Feeding assistance: Limited assistance Dressing Assistance: Maximum assistance     Functional Limitations Info  Hearing, Sight, Speech Sight Info: Adequate Hearing Info: Adequate Speech Info: Adequate    SPECIAL CARE FACTORS FREQUENCY                       Contractures Contractures Info: Not present    Additional Factors Info  Code Status, Isolation Precautions Code Status Info: Full code        Isolation Precautions Info: MRSA     Current Medications (06/21/2015):  This is the current hospital active medication list Current Facility-Administered Medications  Medication Dose Route Frequency Provider Last Rate Last Dose  . acetaminophen (TYLENOL) tablet 650 mg  650 mg Oral Q6H PRN Osvaldo Shipper, MD   650 mg at 06/17/15 2154   Or  . acetaminophen (TYLENOL) suppository 650 mg  650 mg Rectal Q6H PRN Osvaldo Shipper, MD      . antiseptic oral rinse (CPC / CETYLPYRIDINIUM CHLORIDE 0.05%) solution 7 mL  7 mL Mouth Rinse BID Catarina Hartshorn, MD   7 mL at 06/21/15 1011  . collagenase (SANTYL) ointment   Topical Daily Calvert Cantor, MD      . divalproex (DEPAKOTE) DR tablet 500 mg  500 mg Oral BID Osvaldo Shipper, MD   500 mg at 06/21/15 1009  . docusate  sodium (COLACE) capsule 200 mg  200 mg Oral QHS Leda GauzeKaren J Kirby-Graham, NP   200 mg at 06/20/15 2241  . enoxaparin (LOVENOX) injection 40 mg  40 mg Subcutaneous Q24H Osvaldo ShipperGokul Krishnan, MD   40 mg at 06/20/15 2241  . ferrous sulfate tablet 325 mg  325 mg Oral BID Osvaldo ShipperGokul Krishnan, MD   325 mg at 06/21/15 1009  . folic acid (FOLVITE) tablet 1 mg  1 mg Oral Daily Osvaldo ShipperGokul Krishnan, MD   1 mg at 06/21/15 1010  . ibuprofen (ADVIL,MOTRIN) tablet 600 mg  600 mg Oral 3 times per day Catarina Hartshornavid Tat, MD   600 mg at 06/21/15 0532  . morphine 2 MG/ML injection 2 mg  2 mg Intravenous Q3H PRN Osvaldo ShipperGokul Krishnan, MD   2 mg at 06/19/15 0858  . multivitamin with minerals tablet 1 tablet  1 tablet  Oral Daily Osvaldo ShipperGokul Krishnan, MD   1 tablet at 06/21/15 1009  . ondansetron (ZOFRAN) tablet 4 mg  4 mg Oral Q6H PRN Osvaldo ShipperGokul Krishnan, MD       Or  . ondansetron (ZOFRAN) injection 4 mg  4 mg Intravenous Q6H PRN Osvaldo ShipperGokul Krishnan, MD      . oxyCODONE (Oxy IR/ROXICODONE) immediate release tablet 5 mg  5 mg Oral Q6H PRN Osvaldo ShipperGokul Krishnan, MD   5 mg at 06/19/15 2151  . pantoprazole (PROTONIX) EC tablet 40 mg  40 mg Oral QAC breakfast Osvaldo ShipperGokul Krishnan, MD   40 mg at 06/21/15 1009  . sodium chloride 0.9 % 1,000 mL with potassium chloride 20 mEq infusion   Intravenous Continuous Catarina Hartshornavid Tat, MD 100 mL/hr at 06/19/15 2154    . sodium chloride flush (NS) 0.9 % injection 10-40 mL  10-40 mL Intracatheter PRN Catarina Hartshornavid Tat, MD   10 mL at 06/21/15 0423  . sodium chloride flush (NS) 0.9 % injection 3 mL  3 mL Intravenous Q12H Osvaldo ShipperGokul Krishnan, MD   3 mL at 06/18/15 2204  . thiamine (VITAMIN B-1) tablet 100 mg  100 mg Oral Daily Osvaldo ShipperGokul Krishnan, MD   100 mg at 06/21/15 1010  . traZODone (DESYREL) tablet 25 mg  25 mg Oral TID Calvert CantorSaima Rizwan, MD   25 mg at 06/21/15 1010  . vancomycin (VANCOCIN) 500 mg in sodium chloride 0.9 % 100 mL IVPB  500 mg Intravenous Q12H Anh P Pham, RPH   500 mg at 06/21/15 1010  . vitamin C (ASCORBIC ACID) tablet 500 mg  500 mg Oral 2 times per day Calvert CantorSaima Rizwan, MD   500 mg at 06/21/15 1009     Discharge Medications: Please see discharge summary for a list of discharge medications.  Relevant Imaging Results:  Relevant Lab Results:   Additional Information SSN # [id number]  Leron Croakassandra Latrell Potempa

## 2015-06-21 NOTE — Progress Notes (Addendum)
PT Cancellation Note  Patient Details Name: Timothy DubinWarren D Caillouet MRN: [id number] DOB: 01/23/1951   Cancelled Treatment:    Reason Eval/Treat Not Completed: Pain limiting ability to participate (pt initially agreed to getting out of bed, then changed his mind and stated his LLE hurt too much to attempt movement. Pt is poor historian, he inially stated he hasn't stood or walked in a long time, then stated he was standing and walking with PT at SNF prior to admission. Will follow. )   Tamala SerUhlenberg, Eleri Ruben Kistler 06/21/2015, 11:07 AM [phone]

## 2015-06-21 NOTE — Clinical Social Work Placement (Signed)
   CLINICAL SOCIAL WORK PLACEMENT  NOTE  Date:  06/21/2015  Patient Details  Name: Timothy Norman MRN: [id number] Date of Birth: 01/29/1951  Clinical Social Work is seeking post-discharge placement for this patient at the Skilled  Nursing Facility level of care (*CSW will initial, date and re-position this form in  chart as items are completed):  Yes   Patient/family provided with [place] [place] Clinical Social Work Department's list of facilities offering this level of care within the geographic area requested by the patient (or if unable, by the patient's family).  Yes   Patient/family informed of their freedom to choose among providers that offer the needed level of care, that participate in Medicare, Medicaid or managed care program needed by the patient, have an available bed and are willing to accept the patient.  Yes   Patient/family informed of [facility]'s ownership interest in [facility] and [facility], as well as of the fact that they are under no obligation to receive care at these facilities.  PASRR submitted to EDS on       PASRR number received on       Existing PASRR number confirmed on 06/21/15     FL2 transmitted to all facilities in geographic area requested by pt/family on 06/21/15     FL2 transmitted to all facilities within larger geographic area on       Patient informed that his/her managed care company has contracts with or will negotiate with certain facilities, including the following:            Patient/family informed of bed offers received.  Patient chooses bed at  [facility] )     Physician recommends and patient chooses bed at      Patient to be transferred to  [facility] ) on 06/21/15.  Patient to be transferred to facility by  [facility] )     Patient family notified on   of transfer.  Name of family member notified:  Pt denied CSW need to contact family       PHYSICIAN Please sign FL2     Additional Comment:     _______________________________________________ Leron Croakassandra Charlei Ramsaran 06/21/2015, 11:09 AM

## 2015-06-21 NOTE — Clinical Social Work Note (Signed)
Clinical Social Work Assessment  Patient Details  Name: Timothy DubinWarren D Neuenfeldt MRN: [id number] Date of Birth: 01/16/1951  Date of referral:  06/14/15               Reason for consult:  Facility Placement, Discharge Planning                Permission sought to share information with:    Permission granted to share information::     Name::      Admissions   Agency::   [facility]   Relationship::   Pt did not have anyone that he wanted me to contact   Contact Information:     Housing/Transportation Living arrangements for the past 2 months:  Skilled Building survey[facility]ursing Facility Source of Information:  Facility Patient Interpreter Needed:  None Criminal Activity/Legal Involvement Pertinent to Current Situation/Hospitalization:  No - Comment as needed Significant Relationships:  Other Family Members Lives with:  Facility Resident Do you feel safe going back to the place where you live?    Need for family participation in patient care:  Yes (Comment)  Care giving concerns:  Pt did not express any discharge concerns at this time, however Pt has difficulty with short term memory.   Social Worker assessment / plan:  CSW spoke with the Pt briefly this morning. Pt was speaking with the nurse at the time. CSW [facility] Pt verified he was from that facility. CSW explained role and that CSW would assist with transferring the Pt back over to the facility today. Pt stated, "Yay" and seemed pleased with the d/c plan. Pt did not explain his reason for admission, however CSW reviewed the Pt H&P. Pt was involved in a fall and began to have weakness x2days. Pt was receiving care at the wound clinic and they noticed that the Pt had a decreased BP and increase heart rate. The wound clinic then sent the Pt to the emergency room for evaluation.   Employment status:  Retired Health and safety inspectornsurance information:  Medicare PT Recommendations:  Not assessed at this time Information / Referral to community  resources:     Patient/Family's Response to care:  Pt was excited for the transfer back to facility today. CSW asked Pt if there was anyone that he would like for the CSW to call and Pt stated, "No".   Patient/Family's Understanding of and Emotional Response to Diagnosis, Current Treatment, and Prognosis:  Pt has some understanding of his illness, however the Pt does have memory impairment that inhibits his level of understanding.   Emotional Assessment Appearance:  Appears stated age Attitude/Demeanor/Rapport:  Unable to Assess Affect (typically observed):  Unable to Assess Orientation:  Oriented to Self, Oriented to Place, Oriented to  Time, Oriented to Situation Alcohol / Substance use:  Not Applicable Psych involvement (Current and /or in the community):  No (Comment)  Discharge Needs  Concerns to be addressed:  Discharge Planning Concerns Readmission within the last 30 days:  No Current discharge risk:  None Barriers to Discharge:  No Barriers Identified   Leron CroakCassandra Walter Grima 06/21/2015, 10:58 AM

## 2015-06-21 NOTE — Progress Notes (Signed)
Nutrition Follow-up  DOCUMENTATION CODES:   Severe malnutrition in context of chronic illness  INTERVENTION:  - RD will monitor for additional needs if pt unable to d/c to SNF today  NUTRITION DIAGNOSIS:   Malnutrition related to chronic illness as evidenced by severe depletion of body fat, severe depletion of muscle mass, percent weight loss. -ongoing  GOAL:   Patient will meet greater than or equal to 90% of their needs -unmet  MONITOR:   PO intake, Weight trends, Labs, Skin, I & O's  ASSESSMENT:   Timothy Norman is a 65 y.o. male with past medical history of bilateral knee wounds under management of wound center and Dr. Kelly SplinterSanger. Patient was involved in an accident which resulted in a fall on his knees, which caused significant injury to the knee and patella.  3/7 No new weight since admission. Per chart review, pt ate 15% of lunch 3/4; no other intakes other than comment of "bites" recorded since previous assessment. [facility]. Pt is not meeting needs. He has previously declined all nutrition supplements available. If pt unable to d/c, RD will continue to follow for further intervention. Medications reviewed. PICC inserted yesterday (3/6) for IV antibiotics related to L knee osteomyelitis. Labs reviewed; BUN <5 mg/dL, creatinine low, Ca: 7.8 mg/dL.    3/3 - No intakes documented since RD assessment on 2/28 at which time pt was NPO.  - Diet advanced to CLD 2/28 @ 1700 and to Soft diet 3/2 @ 1347. - Pt very drowsy during visit and discussion this AM and provides very minimal information.  - Breakfast tray on bedside table is untouched.  - Pt states he was able to take a few bites of dinner last night but unable to obtain more detailed information on this from pt.  - Multiple notes indicate that pt refuses nutrition supplements that are ordered in addition to other supplements available here; will order snacks. 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   Diet Order:  DIET SOFT Room service appropriate?: Yes; Fluid consistency:: Thin  Skin:  Wound (see comment) (Stage 2 R hip and Stage 1 R ear pressure areas)  Last BM:  3/2  Height:   Ht Readings from Last 1 Encounters:  06/13/15 6\' 2"  (1.88 m)    Weight:   Wt Readings from Last 1 Encounters:  06/13/15 109 lb 9.1 oz (49.7 kg)    Ideal Body Weight:  86.36 kg  BMI:  Body mass index is 14.06 kg/(m^2).  Estimated Nutritional Needs:   Kcal:  1750-2000 (35-40 cal/kg)  Protein:  50-65 grams (1-1.3gm/kg)  Fluid:  >/= 1.5L  EDUCATION NEEDS:   No education needs identified at this time      Trenton GammonJessica Harmoney Sienkiewicz, RD, LDN Inpatient Clinical Dietitian Pager # [phone] After hours/weekend pager # [phone]

## 2015-06-21 NOTE — Progress Notes (Signed)
[facility]anda [facility].

## 2015-06-21 NOTE — Progress Notes (Signed)
[facility].  Pt and family agreeable. Confirmed plans with facility.  Plan transfer via EMS.    Timothy Norman LCSWA  [facility]  [phone]

## 2015-06-21 NOTE — Discharge Summary (Signed)
Physician Discharge Summary  Timothy Norman CMX:[id number] DOB: 12/01/1950 DOA: 06/13/2015  PCP: Katy Apo, MD  Admit date: 06/13/2015 Discharge date: 06/21/2015  Recommendations for Outpatient Follow-up:  1. Pt will need to follow up with PCP in 2 weeks post discharge 2. Please obtain BMP Every 7 days starting 06/27/2015 3. Please check vancomycin trough on 06/23/2015 and adjust dosing accordingly for trough 15-20 4. Continue vancomycin IV through 07/31/2015 5. CBC every 7 days starting 06/27/2015 6. Discontinue PICC line after last dose of vancomycin 7. Please schedule an appointment to see Dr. Wayland Denis at [facility] in 2 weeks  Discharge Diagnoses:  Sepsis [facility]) -Initially thought to be due to UTI, but most likely due to infected L-knee -blood cultures remain negative -h/o b/l septic knees-s/p debridement 10/26/14 with MSSA and Pseudomonas -Xrays of bilateral knees reveals soft tissue swelling in the left knee "concerning for an infectious process"-there are also some concerns of possible osteomyelitis -orthopedics Dr. Roda Shutters evaluated the patient as he performed surgery on his knee last year--pt refused to allow Dr. Roda Shutters to fully examine him -06/15/2015 CT abdomen pelvis negative for acute findings or bony abnormalities -06/15/2015 CT chest negative for infiltrates or masses -ESR--43, CRP--5.2 -MRI of knees-L-knee with appearance of synovitis from the joint to the anterior skin surface with enhancement of the femoral condyle and tibial plateau concerning for osteomyelitis or septic joint -d/c vancomycin -06/17/15-case discussed with ortho, Dr. Roda Shutters, who felt that another debridement would not be fruitful as infection would recur in presence of fistulous tract--stated that pt needs L-AKA or tx with abx long term with possible flap in future -pt refuses AKA at this time -06/17/15-case discussed with IR, Dr. Sherrlyn Hock try to obtain fluid or swab tract -please send  any fluid for cell count-->grew MRSA -place PICC and tx L-knee infection--pt refused 06/18/15--after discussion with pt, pt now agreeable -Plan intravenous vancomycin through 07/31/2015 (42 days)  Osteomyelitis of left knee -06/18/15 culture from IR--staph aureus -d/c cefepime -d/c cefazolin--start vancomycin -06/17/15 knee culture--MRSA -plan 6 weeks IV abx through 07/1615 -will need plastics re-eval after abx for flap -06/20/15--PICC line -PT eval--pt refused  Labile BP -am cortisol--14.8 -?dysautonomia -BP overall improving, but still having intermitten periods of lability  Diarrhea -improved -C. Diff and GI pathogen panel neg  Wounds, multiple - no external signs of infection-but fistula? noted on L-knee wound -Appreciate wound care evaluation -right gluteal wound unstageable without purulent drainage or surrounding cellulitis  Pericardial effusion -06/15/15--Echocardiogram--moderate pericardial effusion with EF 65-70%, grade 1 diastolic dysfunction, no WMA; no hemodynamic compromise and -pt had trivial pericardial effusion on 10/30/14 -unclear reason for increase in pericardial effusion -appreciate cardiology--no indication for invasive procedure presently--recommended ibuprofen 600 mg tid x 10 days-->6 more days after d/c -admission EKG without ST changes -trial of ibuprofen--monitor renal function   Encephalopathy - not certain of his baseline, but quite lucid on 06/16/15  -appears to be back to baseline 08/20/73 - not certain if he has an underlying psych diagnosis as he is on Depakote and Trazodone  Severe protein calorie malnutrition -continue supplementa  Discharge Condition: Stable  Disposition: SNF      Follow-up Information    Follow up with [facility] .   Specialty:  Skilled Nursing Facility   Contact information:   [address] [place] [address] [phone]      Diet:soft Wt Readings from Last 3 Encounters:  06/13/15  49.7 kg (109 lb 9.1 oz)  06/16/15 49.442  kg (109 lb)  03/17/15 63.322 kg (139 lb 9.6 oz)    History of present illness:  Timothy Norman is a 65 y.o. male with history of extensive bilateral knee injury secondary to an accident under care of Dr. [name] at the wound center. He is been living in a nursing facility and presents for hypotension and tachycardia. He is a poor historian and proper history is unobtainable. He did admit on admission to having diarrhea in 2 days of weakness. He was also noted to be having pain while urinating in the ER. UA had pyuria and nitrites, and it was suspected he may have a UTI initially. He was also found to have lactic acidosis. The patient was started on intravenous fluids and intravenous antibiotics. Further workup revealed that the source of his infection was likely to be osteomyelitis of his left knee. MRI revealed small knee effusion and signs of osteomyelitis. Orthopedics was consulted. They did not feel that the patient would benefit from debridement as the patient had high likelihood of reinfection. They stated that the patient would actually need above-the-knee amputation. The patient refused. Therefore, the patient was treated with antibiotics. Initial radiology was consulted. They obtained a culture under sterile conditions from his left knee. The culture grew MRSA. The patient was started on intravenous vancomycin. Ultimately, the patient's blood pressure improved and remained stable. He was difficult patient refusing care on multiple occasions. Ultimately, a PICC line was placed and the patient was continued on antibiotics.  Consultants: Ortho--Xu IR  Discharge Exam: Filed Vitals:   06/20/15 2300 06/21/15 0617  BP: 149/83 141/81  Pulse:  86  Temp:  98.1 F (36.7 C)  Resp:  18   Filed Vitals:   06/20/15 1500 06/20/15 2136 06/20/15 2300 06/21/15 0617  BP: 122/69 177/107 149/83 141/81  Pulse: 77 92  86  Temp: 97.8 F (36.6 C) 97.9 F (36.6  C)  98.1 F (36.7 C)  TempSrc: Axillary Axillary  Axillary  Resp: '20 19  18  '$ Height:      Weight:      SpO2: 100% 100%  99%   General: A&O x 3, NAD, pleasant, cooperative Cardiovascular: RRR, no rub, no gallop, no S3 Respiratory: Poor inspiratory effort but clear to auscultation. No wheezing. Good air movement.  Abdomen:soft, nontender, nondistended, positive bowel sounds Extremities: 1 +LE edema, No lymphangitis, no petechiae  Discharge Instructions     Medication List    TAKE these medications        CERTAVITE/ANTIOXIDANTS Tabs  Take 1 tablet by mouth daily. For wound healing     collagenase ointment  Commonly known as:  SANTYL  Apply topically daily. Apply topically daily to right knee, and buttock ulceration     divalproex 500 MG DR tablet  Commonly known as:  DEPAKOTE  Take 500 mg by mouth 2 (two) times daily.     docusate sodium 100 MG capsule  Commonly known as:  COLACE  Take 2 capsules (200 mg total) by mouth daily.     ferrous sulfate 325 (65 FE) MG tablet  Take 325 mg by mouth 2 (two) times daily. 35HG and 99ME     folic acid 1 MG tablet  Commonly known as:  FOLVITE  Take 1 tablet (1 mg total) by mouth daily.     ibuprofen 600 MG tablet  Commonly known as:  ADVIL,MOTRIN  Take 1 tablet (600 mg total) by mouth every 8 (eight) hours. X 6 days only  lactose free nutrition Liqd  Take 120 mLs by mouth 3 (three) times daily with meals. 9am, 11am, 6pm     RESOURCE 2.0 Liqd  Take 120 mLs by mouth 3 (three) times daily.     oxyCODONE 5 MG immediate release tablet  Commonly known as:  Oxy IR/ROXICODONE  Take 1 tablet (5 mg total) by mouth every 6 (six) hours as needed for moderate pain or severe pain.     pantoprazole 40 MG tablet  Commonly known as:  PROTONIX  Take 40 mg by mouth daily before breakfast.     thiamine 100 MG tablet  Take 1 tablet (100 mg total) by mouth daily.     traZODone 50 MG tablet  Commonly known as:  DESYREL  Take 25 mg by  mouth 3 (three) times daily.     vancomycin 500 mg in sodium chloride 0.9 % 100 mL  Inject 500 mg into the vein every 12 (twelve) hours. Last doses on 07/31/15     vitamin C 500 MG tablet  Commonly known as:  ASCORBIC ACID  Take 500 mg by mouth 2 (two) times daily. 10am and 6pm for wound healing         The results of significant diagnostics from this hospitalization (including imaging, microbiology, ancillary and laboratory) are listed below for reference.    Significant Diagnostic Studies: Dg Chest 2 View  06/13/2015  CLINICAL DATA:  Increased weakness. EXAM: CHEST  2 VIEW COMPARISON:  October 26, 2014. FINDINGS: The heart size and mediastinal contours are within normal limits. No pneumothorax or pleural effusion is noted. Left lung is clear. Mild right basilar atelectasis or scarring is noted with associated nodular density. Probable old left rib fracture is noted. IMPRESSION: Mild right basilar atelectasis or scarring is noted with probable nodular density. CT scan of the chest is recommended to rule out mass or neoplasm. Electronically Signed   By: Marijo Conception, M.D.   On: 06/13/2015 11:59   Dg Knee 1-2 Views Left  06/13/2015  CLINICAL DATA:  Patient with sepsis. Patient reports no pain. Recent history patellectomy. EXAM: LEFT KNEE - 1-2 VIEW COMPARISON:  Knee radiograph 10/26/2014 FINDINGS: Interval removal of the patella. There is extensive swelling about the knee joint. Limited evaluation for cortical irregularity. Multiple new ossific densities are demonstrated within the overlying soft tissues. Markedly limited exam secondary to poor patient positioning as the patient would not cooperate with the radiographs. IMPRESSION: Marked soft tissue swelling about the knee concerning for an infectious process. Overall the exam is markedly limited secondary to difficulty with patient positioning as the patient did not cooperate with the examination. Recommend repeat evaluation when patient is  clinically able. Multiple new calcific densities about the knee joint which for nonspecific may represent heterotopic calcification versus sequelae of infection. Electronically Signed   By: Lovey Newcomer M.D.   On: 06/13/2015 14:44   Dg Knee 1-2 Views Right  06/13/2015  CLINICAL DATA:  Sepsis with unknown source EXAM: RIGHT KNEE - 1-2 VIEW COMPARISON:  October 26, 2014 FINDINGS: Frontal and lateral views were obtained. There is no acute fracture or dislocation. There is no appreciable knee joint effusion. There is calcification within the knee joint anteriorly and superiorly, possibly residua of old trauma or infection. The bones appear somewhat osteoporotic. No well-defined bony destruction is appreciable. There is slight narrowing medially and in the patellofemoral joint region. There are foci of arterial vascular calcification present. IMPRESSION: No well-defined bony destruction. Periarticular osteoporosis, particularly in  the distal medial femoral condyle, could mask a degree of subtle osteomyelitis. Calcification in the knee joint is probably due to residua of either old trauma or infection. There is no acute fracture or dislocation. No joint effusion evident. There are foci of atherosclerotic calcification. If there remains concern for potential osteomyelitis, either MRI or three-phase nuclear medicine bone scan potentially could be helpful for further assessment. Electronically Signed   By: Lowella Grip III M.D.   On: 06/13/2015 14:38   Ct Chest W Contrast  06/15/2015  CLINICAL DATA:  Sepsis. Urinary tract infection. Hypotension. Initial encounter. EXAM: CT CHEST, ABDOMEN, AND PELVIS WITH CONTRAST TECHNIQUE: Multidetector CT imaging of the chest, abdomen and pelvis was performed following the standard protocol during bolus administration of intravenous contrast. CONTRAST:  100 mL OMNIPAQUE IOHEXOL 300 MG/ML  SOLN COMPARISON:  PA and lateral chest 06/13/2015. FINDINGS: CT CHEST The patient has small  bilateral pleural effusions. Moderate pericardial effusion is also seen. Heart size is normal. Calcific aortic and coronary atherosclerosis is identified. No axillary, hilar or mediastinal lymphadenopathy. Lungs demonstrate some linear atelectasis in the right lower lobe. There is also mild dependent atelectasis. The lungs are otherwise clear. No lytic or sclerotic bony lesion is seen. Remote lower left rib fractures are incidentally noted. CT ABDOMEN AND PELVIS The gallbladder, spleen, adrenal glands, kidneys, pancreas and biliary tree all appear normal. A 0.9 cm hypo attenuating lesion in the dome of the liver is compatible with a cyst. The liver is otherwise unremarkable. There is aortoiliac atherosclerosis without aneurysm. Body wall edema is noted. The prostate gland is mildly prominent. Urinary bladder and seminal vesicles are unremarkable. Prominent stool in the rectosigmoid colon is noted. The stomach and small bowel are unremarkable. There is no lymphadenopathy. Trace amount of free pelvic fluid is noted. No organized fluid collection. No focal bony abnormality. IMPRESSION: No source for the patient's infection is identified. Very small bilateral pleural effusions with a small to moderate pericardial effusion also seen. Calcific aortic and coronary atherosclerosis. Prominent stool burden in the rectosigmoid colon. Mild enlargement of prostate gland. Body wall edema and trace amount of free pelvic fluid compatible with anasarca. Electronically Signed   By: Inge Rise M.D.   On: 06/15/2015 13:45   Ct Abdomen Pelvis W Contrast  06/15/2015  CLINICAL DATA:  Sepsis. Urinary tract infection. Hypotension. Initial encounter. EXAM: CT CHEST, ABDOMEN, AND PELVIS WITH CONTRAST TECHNIQUE: Multidetector CT imaging of the chest, abdomen and pelvis was performed following the standard protocol during bolus administration of intravenous contrast. CONTRAST:  100 mL OMNIPAQUE IOHEXOL 300 MG/ML  SOLN COMPARISON:  PA  and lateral chest 06/13/2015. FINDINGS: CT CHEST The patient has small bilateral pleural effusions. Moderate pericardial effusion is also seen. Heart size is normal. Calcific aortic and coronary atherosclerosis is identified. No axillary, hilar or mediastinal lymphadenopathy. Lungs demonstrate some linear atelectasis in the right lower lobe. There is also mild dependent atelectasis. The lungs are otherwise clear. No lytic or sclerotic bony lesion is seen. Remote lower left rib fractures are incidentally noted. CT ABDOMEN AND PELVIS The gallbladder, spleen, adrenal glands, kidneys, pancreas and biliary tree all appear normal. A 0.9 cm hypo attenuating lesion in the dome of the liver is compatible with a cyst. The liver is otherwise unremarkable. There is aortoiliac atherosclerosis without aneurysm. Body wall edema is noted. The prostate gland is mildly prominent. Urinary bladder and seminal vesicles are unremarkable. Prominent stool in the rectosigmoid colon is noted. The stomach and small bowel are unremarkable.  There is no lymphadenopathy. Trace amount of free pelvic fluid is noted. No organized fluid collection. No focal bony abnormality. IMPRESSION: No source for the patient's infection is identified. Very small bilateral pleural effusions with a small to moderate pericardial effusion also seen. Calcific aortic and coronary atherosclerosis. Prominent stool burden in the rectosigmoid colon. Mild enlargement of prostate gland. Body wall edema and trace amount of free pelvic fluid compatible with anasarca. Electronically Signed   By: Inge Rise M.D.   On: 06/15/2015 13:45   Mr Knee Right W Wo Contrast  06/16/2015  CLINICAL DATA:  Knee pain, swelling, and cellulitis, especially anteriorly. EXAM: MRI OF THE RIGHT KNEE WITHOUT AND WITH CONTRAST TECHNIQUE: Multiplanar, multisequence MR imaging of the knee was performed before and after the administration of intravenous contrast. CONTRAST:  9 cc MultiHance  COMPARISON:  06/13/2015 FINDINGS: Despite efforts by the technologist and patient, motion artifact is present on today's exam and could not be eliminated. This reduces exam sensitivity and specificity. MENISCI Medial meniscus:  Unremarkable Lateral meniscus:  Unremarkable LIGAMENTS Cruciates:  Proximal PCL degeneration. Collaterals:  Mildly thickened MCL. CARTILAGE Patellofemoral: Suspected moderate chondral thinning and potential articular spurring along the medial patellar facet articular surface. Characterization problematic due to motion artifact. Medial: Suspected chondral thinning along the medial tibial plateau. Lateral:  Suspected mild chondral thinning. Joint: Small to moderate knee effusion with mild synovitis. Edema tracks in Hoffa's fat pad. Popliteal Fossa: There is considerable edema in the biceps femoris muscle, image 5 series 9. Edema also tracks in the anterior compartment of the upper calf, and there is diffuse subcutaneous edema around the knee especially posteriorly, laterally, and medially. Extensor Mechanism: Ossifications are present in the distal quadriceps tendon which also appears partially torn posteriorly in its vastus intermedius portion. Mild thickening of the patellar tendon. Suspected detachment of the medial patellar retinaculum from the medial patella, images 7 through 12 series 4. Poor definition of the lateral patellar retinaculum. Bones: There is abnormal edema superiorly in the patella, image 18 series 7, primarily anteriorly in the vicinity of the quadriceps fibrocartilage continuation and adjacent bone. IMPRESSION: 1. Distal posterior quadriceps appears torn. This likely corresponds to the vastus intermedius portion of the tendon. There is also edema in the upper margin of the patella with only very faint associated enhancement, likely reactive rather than due to osteomyelitis. Ossifications noted in the distal quadriceps tendon based on conventional radiography. Thickened  patellar tendon. 2. Diffuse subcutaneous edema, nonspecific but a component of this may reflect cellulitis. 3. Small to moderate knee effusion but with only mild synovitis and without findings characteristic of septic joint. 4. Variable but mainly mild to moderate degrees of chondral thinning in the knee compartments. 5. Discontinuity medially in the medial patellar retinaculum compatible with tear. Indistinct lateral patellar retinaculum, nonspecific. 6. Despite efforts by the technologist and patient, motion artifact is present on today's exam and could not be eliminated. This reduces exam sensitivity and specificity. 7. Mildly thickened MCL could be a result of a prior injury. 8. Mild proximal PCL degeneration. Electronically Signed   By: Van Clines M.D.   On: 06/16/2015 19:42   Mr Knee Left W Wo Contrast  06/17/2015  CLINICAL DATA:  Cellulitis and swelling left knee pain. Prior patellar removal. Difficulty moving the lower extremity. EXAM: MRI OF THE LEFT KNEE WITHOUT AND WITH CONTRAST TECHNIQUE: Multiplanar, multisequence MR imaging of the knee was performed before and after the administration of intravenous contrast. CONTRAST:  43m MULTIHANCE GADOBENATE DIMEGLUMINE 529  MG/ML IV SOLN COMPARISON:  06/13/2015 FINDINGS: MENISCI Medial meniscus: Diminutive, indistinct, and irregular posterior horn medially, likely torn but indistinct. Diminutive anterior horn, likely torn. Lateral meniscus: Very little in the way of recognizable meniscal tissue, with a thin band of tissue likely corresponding to the lateral meniscus noted along the periphery of the joint on most images. LIGAMENTS Cruciates: Abnormal slope and indistinctness of the ACL favoring ACL tear. PCL degeneration proximally. Collaterals: Highly indistinct proximal MCL, thickened and edematous, likely torn. Highly indistinct proximal fibular collateral ligament, at least a grade 2 sprain. Prominent tendinopathy or partial tearing of the proximal  popliteus tendon. CARTILAGE Patellofemoral: The patella is absent. Femoral trochlear groove articular cartilage thin and indistinct. Medial: Severe chondral loss is specially anteriorly in the joint. Diffuse subcortical edema. Lateral:  Markedly severe chondral loss.  Diffuse subcortical edema. Joint: Markedly severe synovitis in Hoffa's fat pad and along the expected location of the suprapatellar bursa. Suspected inferior plica or band of fibrosis extending from the patellectomy site towards the ACL. Prominent synovitis posteriorly in the knee joint. Popliteal Fossa: Diffuse edema in the subcutaneous tissues and tracking within along the proximal gastrocnemius musculature, popliteus musculature, and proximal soleus musculature. There is also low-level edema in the anterior compartment musculature in the lower leg. Extensor Mechanism: Indistinct connection of the quadriceps tendons to the patellectomy site, probably discontinuous. On images 17-18 there appears to be a fluid-filled potentially draining collection extending from the joint to the scan of the patellectomy site anteriorly. Severe surrounding synovitis. The thin residual patellar tendon appears to attach to several small bony ossicles but is not thought to be providing a by mechanically significant connection to the quadriceps tendons. The retinacular highly indistinct and likely discontinuous. Bones: Prominent edema and enhancement in both femoral condyles and throughout the tibial plateau. IMPRESSION: 1. Markedly severe synovitis with possible draining track extending from the joint to the anterior skin surface in the vicinity of the patellectomy. There is prominent edema and enhancement in the femoral condyles and tibial plateau raising suspicion of a septic joint with osteomyelitis, although very little fluid to drain separate from the severe synovitis. 2. Highly indistinct quadriceps and proximal patellar tendon in the vicinity of the patellectomy,  probably discontinuous and likely not by mechanically significant. The patellar retinacula likewise appear discontinuous/detached. 3. Abnormal ACL slope, quite likely torn. 4. Severe diminution in size and indistinctness of the lateral meniscus, favoring high-grade tearing. Diminutive size of portions of the medial meniscus compatible with meniscal tears. 5. Highly indistinct, thickened, and edematous proximal MCL, likely torn. Grade 2 sprain of the fibular collateral ligament and partial tearing or tendinopathy of the proximal popliteus tendon. 6. Severe loss of articular cartilage in the medial and lateral compartments (especially the lateral compartment). Thinning of the femoral trochlear groove articular cartilage. 7. There is a band of fibrosis extending to a medial plica from the patellectomy site. 8. Diffuse subcutaneous edema favoring cellulitis. Electronically Signed   By: Van Clines M.D.   On: 06/17/2015 07:46     Microbiology: Recent Results (from the past 240 hour(s))  Culture, blood (routine x 2)     Status: None   Collection Time: 06/13/15 11:07 AM  Result Value Ref Range Status   Specimen Description BLOOD LEFT ANTECUBITAL  Final   Special Requests IN PEDIATRIC BOTTLE 4ML  Final   Culture   Final    NO GROWTH 5 DAYS Performed at [facility]    Report Status 06/18/2015 FINAL  Final  Culture, blood (routine x 2)     Status: None   Collection Time: 06/13/15 11:08 AM  Result Value Ref Range Status   Specimen Description BLOOD LEFT HAND  Final   Special Requests BOTTLES DRAWN AEROBIC AND ANAEROBIC  Final   Culture   Final    NO GROWTH 5 DAYS Performed at [facility]    Report Status 06/18/2015 FINAL  Final  Urine culture     Status: None   Collection Time: 06/13/15 12:55 PM  Result Value Ref Range Status   Specimen Description URINE, CLEAN CATCH  Final   Special Requests NONE  Final   Culture   Final    MULTIPLE SPECIES PRESENT, SUGGEST  RECOLLECTION Performed at [facility]    Report Status 06/14/2015 FINAL  Final  C difficile quick scan w PCR reflex     Status: None   Collection Time: 06/13/15  4:26 PM  Result Value Ref Range Status   C Diff antigen NEGATIVE NEGATIVE Final   C Diff toxin NEGATIVE NEGATIVE Final   C Diff interpretation Negative for toxigenic C. difficile  Final  MRSA PCR Screening     Status: None   Collection Time: 06/13/15  5:46 PM  Result Value Ref Range Status   MRSA by PCR NEGATIVE NEGATIVE Final    Comment:        The GeneXpert MRSA Assay (FDA approved for NASAL specimens only), is one component of a comprehensive MRSA colonization surveillance program. It is not intended to diagnose MRSA infection nor to guide or monitor treatment for MRSA infections.   Gastrointestinal Panel by PCR , Stool     Status: None   Collection Time: 06/14/15 11:50 AM  Result Value Ref Range Status   Campylobacter species NOT DETECTED NOT DETECTED Final   Plesimonas shigelloides NOT DETECTED NOT DETECTED Final   Salmonella species NOT DETECTED NOT DETECTED Final   Yersinia enterocolitica NOT DETECTED NOT DETECTED Final   Vibrio species NOT DETECTED NOT DETECTED Final   Vibrio cholerae NOT DETECTED NOT DETECTED Final   Enteroaggregative E coli (EAEC) NOT DETECTED NOT DETECTED Final   Enteropathogenic E coli (EPEC) NOT DETECTED NOT DETECTED Final   Enterotoxigenic E coli (ETEC) NOT DETECTED NOT DETECTED Final   Shiga like toxin producing E coli (STEC) NOT DETECTED NOT DETECTED Final   E. coli O157 NOT DETECTED NOT DETECTED Final   Shigella/Enteroinvasive E coli (EIEC) NOT DETECTED NOT DETECTED Final   Cryptosporidium NOT DETECTED NOT DETECTED Final   Cyclospora cayetanensis NOT DETECTED NOT DETECTED Final   Entamoeba histolytica NOT DETECTED NOT DETECTED Final   Giardia lamblia NOT DETECTED NOT DETECTED Final   Adenovirus F40/41 NOT DETECTED NOT DETECTED Final   Astrovirus NOT DETECTED NOT DETECTED  Final   Norovirus GI/GII NOT DETECTED NOT DETECTED Final   Rotavirus A NOT DETECTED NOT DETECTED Final   Sapovirus (I, II, IV, and V) NOT DETECTED NOT DETECTED Final  Culture, routine-abscess     Status: None   Collection Time: 06/17/15 10:45 AM  Result Value Ref Range Status   Specimen Description ABSCESS LEFT KNEE  Final   Special Requests Immunocompromised  Final   Gram Stain   Final    NO WBC SEEN NO SQUAMOUS EPITHELIAL CELLS SEEN RARE GRAM POSITIVE COCCI IN PAIRS Performed at [facility]    Culture   Final    FEW METHICILLIN RESISTANT STAPHYLOCOCCUS AUREUS Note: RIFAMPIN AND GENTAMICIN SHOULD NOT BE USED AS SINGLE  DRUGS FOR TREATMENT OF STAPH INFECTIONS. CRITICAL RESULT CALLED TO, READ BACK BY AND VERIFIED WITH: MORGAN EAST RN 06/20/15 AT 850 AM BY [facility] Performed at [facility]    Report Status 06/20/2015 FINAL  Final   Organism ID, Bacteria METHICILLIN RESISTANT STAPHYLOCOCCUS AUREUS  Final      Susceptibility   Methicillin resistant staphylococcus aureus - MIC*    CLINDAMYCIN >=8 RESISTANT Resistant     ERYTHROMYCIN >=8 RESISTANT Resistant     GENTAMICIN <=0.5 SENSITIVE Sensitive     LEVOFLOXACIN >=8 RESISTANT Resistant     OXACILLIN >=4 RESISTANT Resistant     RIFAMPIN <=0.5 SENSITIVE Sensitive     TRIMETH/SULFA <=10 SENSITIVE Sensitive     VANCOMYCIN 1 SENSITIVE Sensitive     TETRACYCLINE 2 SENSITIVE Sensitive     * FEW METHICILLIN RESISTANT STAPHYLOCOCCUS AUREUS     Labs: Basic Metabolic Panel:  Recent Labs Lab 06/16/15 0327 06/18/15 0636 06/19/15 0616 06/20/15 0550 06/21/15 0400  NA 141 140 141 142 139  K 3.6 3.6 3.9 4.0 4.5  CL 106 106 110 112* 110  CO2 '26 26 23 24 24  '$ GLUCOSE 80 64* 68 65 57*  BUN <5* 6 6 <5* <5*  CREATININE 0.50* 0.45* 0.52* 0.46* 0.55*  CALCIUM 8.4* 8.2* 8.1* 7.9* 7.8*   Liver Function Tests: No results for input(s): AST, ALT, ALKPHOS, BILITOT, PROT, ALBUMIN in the last 168 hours. No results for input(s):  LIPASE, AMYLASE in the last 168 hours. No results for input(s): AMMONIA in the last 168 hours. CBC:  Recent Labs Lab 06/16/15 0327 06/18/15 0636  WBC 7.4 5.8  HGB 11.0* 9.1*  HCT 35.5* 30.0*  MCV 80.3 81.1  PLT 357 363   Cardiac Enzymes: No results for input(s): CKTOTAL, CKMB, CKMBINDEX, TROPONINI in the last 168 hours. BNP: Invalid input(s): POCBNP CBG: No results for input(s): GLUCAP in the last 168 hours.  Time coordinating discharge:  Greater than 30 minutes  Signed:  Miranda Garber, DO [facility] Pager: [phone] 06/21/2015, 1:29 PM

## 2015-07-18 ENCOUNTER — Encounter (HOSPITAL_BASED_OUTPATIENT_CLINIC_OR_DEPARTMENT_OTHER): Payer: No Typology Code available for payment source | Attending: Internal Medicine

## 2015-07-18 DIAGNOSIS — Z590 Homelessness: Secondary | ICD-10-CM | POA: Diagnosis not present

## 2015-07-18 DIAGNOSIS — M1990 Unspecified osteoarthritis, unspecified site: Secondary | ICD-10-CM | POA: Insufficient documentation

## 2015-07-18 DIAGNOSIS — L97121 Non-pressure chronic ulcer of left thigh limited to breakdown of skin: Secondary | ICD-10-CM | POA: Insufficient documentation

## 2015-07-18 DIAGNOSIS — L97821 Non-pressure chronic ulcer of other part of left lower leg limited to breakdown of skin: Secondary | ICD-10-CM | POA: Diagnosis not present

## 2015-07-18 DIAGNOSIS — L97819 Non-pressure chronic ulcer of other part of right lower leg with unspecified severity: Secondary | ICD-10-CM | POA: Diagnosis present

## 2016-03-23 ENCOUNTER — Encounter (HOSPITAL_BASED_OUTPATIENT_CLINIC_OR_DEPARTMENT_OTHER): Payer: Medicare Other | Attending: Internal Medicine

## 2016-03-23 DIAGNOSIS — L97121 Non-pressure chronic ulcer of left thigh limited to breakdown of skin: Secondary | ICD-10-CM | POA: Insufficient documentation

## 2016-03-23 DIAGNOSIS — Y838 Other surgical procedures as the cause of abnormal reaction of the patient, or of later complication, without mention of misadventure at the time of the procedure: Secondary | ICD-10-CM | POA: Insufficient documentation

## 2016-03-23 DIAGNOSIS — T8131XA Disruption of external operation (surgical) wound, not elsewhere classified, initial encounter: Secondary | ICD-10-CM | POA: Insufficient documentation

## 2016-04-06 DIAGNOSIS — T8131XA Disruption of external operation (surgical) wound, not elsewhere classified, initial encounter: Secondary | ICD-10-CM | POA: Diagnosis not present

## 2016-09-24 ENCOUNTER — Ambulatory Visit (INDEPENDENT_AMBULATORY_CARE_PROVIDER_SITE_OTHER): Payer: Self-pay | Admitting: Orthopaedic Surgery

## 2016-09-25 ENCOUNTER — Ambulatory Visit (INDEPENDENT_AMBULATORY_CARE_PROVIDER_SITE_OTHER): Payer: Medicare Other | Admitting: Orthopaedic Surgery

## 2016-09-25 DIAGNOSIS — M8668 Other chronic osteomyelitis, other site: Secondary | ICD-10-CM | POA: Diagnosis not present

## 2016-09-25 DIAGNOSIS — M009 Pyogenic arthritis, unspecified: Secondary | ICD-10-CM

## 2016-09-25 NOTE — Progress Notes (Signed)
Office Visit Note   Patient: Timothy Norman           Date of Birth: 05/26/1950           MRN: [id number] Visit Date: 09/25/2016              Requested by: Renford DillsPolite, Ronald, MD [address] [place], [place] [address] PCP: Renford DillsPolite, Ronald, MD   Assessment & Plan: Visit Diagnoses:  1. Chronic osteomyelitis of knee (HCC)   2. Pyogenic arthritis of right knee joint, due to unspecified organism [facility](HCC)     Plan: Patient has deconditioning and hip and knee contractures. Patient is unlikely to ever walk again. This was discussed with the patient. From my standpoint there is no orthopedic intervention that will help with the pain. Follow-up with me as needed.  Follow-Up Instructions: Return if symptoms worsen or fail to improve.   Orders:  No orders of the defined types were placed in this encounter.  No orders of the defined types were placed in this encounter.     Procedures: No procedures performed   Clinical Data: No additional findings.   Subjective: Chief Complaint  Patient presents with  . Right Hip - Pain  . Left Hip - Pain  . Left Knee - Pain  . Right Knee - Pain    Timothy Norman is a gentleman that I took care of over a year ago for bilateral knee osteomyelitis and wounds and eventually turned over to Dr. Ulice Bolddillingham of plastic surgery.  He comes in today for her hip and knee pain bilaterally. He has been in a wheelchair since then. Patient is currently at a nursing facility. He is a poor historian.    Review of Systems  Constitutional: Negative.   All other systems reviewed and are negative.    Objective: Vital Signs: There were no vitals taken for this visit.  Physical Exam  Constitutional: He is oriented to person, place, and time. He appears well-developed and well-nourished.  Pulmonary/Chest: Effort normal.  Abdominal: Soft.  Neurological: He is alert and oriented to person, place, and time.  Skin: Skin is warm.  Psychiatric: He has a  normal mood and affect. His behavior is normal. Judgment and thought content normal.  Nursing note and vitals reviewed.   Ortho Exam Knee exam shows small open wounds anteriorly. There is no signs of active infection. Hip joints are contracted. Specialty Comments:  No specialty comments available.  Imaging: No results found.   PMFS History: Patient Active Problem List   Diagnosis Date Noted  . Chronic osteomyelitis of knee (HCC) 06/18/2015  . Pressure ulcer 06/16/2015  . Pericardial effusion 06/15/2015  . Urinary tract infection 06/13/2015  . Sepsis (HCC) 06/13/2015  . Dehydration 06/13/2015  . Diarrhea 06/13/2015  . Pseudomonas infection   . Septic arthritis of knee, right (HCC) 10/29/2014  . Altered mental status   . Alcohol abuse 10/27/2014  . Acute renal insufficiency 10/27/2014  . Polysubstance abuse 10/27/2014  . Poor dentition 10/27/2014  . Left rib fracture 10/27/2014  . ETOH abuse 10/27/2014  . Wound abscess   . Encephalopathy   . Staphylococcus aureus bacteremia   . Acute blood loss anemia   . Wounds, multiple 10/26/2014   Past Medical History:  Diagnosis Date  . Acute renal insufficiency    from hospital discharge summary 11/05/14.  Marland Kitchen. Altered mental status    from hospital discharge summary 11/05/14.  Marland Kitchen. Anemia   . Encephalopathy  from hospital discharge summary 11/05/14  . ETOH abuse   . Left rib fracture    from hospital discharge summary 11/05/14.  . Muscle weakness   . Polysubstance abuse    from hospital discharge summary 11/05/14.  Marland Kitchen Poor dentition    from hospital discharge summary 11/05/14.  . Pseudomonas infection    from hospital discharge summary 11/05/14.  . Sepsis due to other specified Staphylococcus (HCC)   . Septic arthritis of knee, left (HCC)    from hospital discharge summary 11/05/14.  . Septic arthritis of knee, right (HCC)    from hospital discharge summary 11/05/14.  Marland Kitchen Staphylococcus aureus bacteremia    from hospital discharge  summary 11/05/14.  Marland Kitchen Ulcers of both lower extremities (HCC)   . Wound abscess    from hospital discharge summary 11/05/14.    No family history on file.  Past Surgical History:  Procedure Laterality Date  . APPLICATION OF A-CELL OF EXTREMITY Bilateral 03/07/2015   Procedure: APPLICATION OF A-CELL and wound vac exchange to bilateral knees;  Surgeon: Alena Bills Dillingham, DO;  Location: [facility] OR;  Service: Plastics;  Laterality: Bilateral;  . APPLICATION OF WOUND VAC Left 01/21/2015   Procedure: APPLICATION OF WOUND VAC LEFT ANTERIOR KNEE;  Surgeon: Tarry Kos, MD;  Location: [facility] OR;  Service: Orthopedics;  Laterality: Left;  . I&D EXTREMITY Bilateral 10/26/2014   Procedure: IRRIGATION AND DEBRIDEMENT BILATERAL LEGS;  Surgeon: Tarry Kos, MD;  Location: [facility] OR;  Service: Orthopedics;  Laterality: Bilateral;  . I&D EXTREMITY Bilateral 10/29/2014   Procedure: IRRIGATION AND DEBRIDEMENT BILATERAL KNEES WITH VAC CHANGE;  Surgeon: Tarry Kos, MD;  Location: [facility] OR;  Service: Orthopedics;  Laterality: Bilateral;  . I&D EXTREMITY Bilateral 01/19/2015   Procedure: IRRIGATION AND DEBRIDEMENT BILATERAL KNEES  WITH PLACEMENT A CELL on right knee and bilateral knee wound VACs. ;  Surgeon: Alena Bills Dillingham, DO;  Location: [facility] OR;  Service: Plastics;  Laterality: Bilateral;  . I&D EXTREMITY Bilateral 03/17/2015   Procedure: IRRIGATION AND DEBRIDEMENT BILATERAL KNEE WITH PLACEMENT OF ACCELL AND VAC;  Surgeon: Alena Bills Dillingham, DO;  Location: [facility] OR;  Service: Plastics;  Laterality: Bilateral;  . I&D EXTREMITY Bilateral 03/07/2015   Procedure: IRRIGATION AND DEBRIDEMENT OF BILATARAL KNEES;  Surgeon: Alena Bills Dillingham, DO;  Location: [facility] OR;  Service: Plastics;  Laterality: Bilateral;  . INFECTED SKIN DEBRIDEMENT Bilateral 10/26/2014  . PATELLECTOMY Left 01/21/2015   Procedure: PATELLECTOMY;  Surgeon: Tarry Kos, MD;  Location: [facility] OR;  Service: Orthopedics;  Laterality: Left;  irrigation and debridement left  anterior knee with patellectomy   . TEE WITHOUT CARDIOVERSION N/A 11/03/2014   Procedure: TRANSESOPHAGEAL ECHOCARDIOGRAM (TEE);  Surgeon: Wendall Stade, MD;  Location: [facility] ENDOSCOPY;  Service: Cardiovascular;  Laterality: N/A;   Social History   Occupational History  . Not on file.   Social History Main Topics  . Smoking status: Former Smoker    Packs/day: 0.25    Types: Cigarettes    Quit date: 10/18/2014  . Smokeless tobacco: Never Used  . Alcohol use 1.2 oz/week    2 Cans of beer per week  . Drug use: No  . Sexual activity: Yes

## 2016-11-04 IMAGING — CR DG KNEE 1-2V*L*
2 series · 2 of 2 positions shown · non-contrast
Comparison: Knee radiograph 10/26/2014

CLINICAL DATA: Patient with sepsis. Patient reports no pain. Recent
history patellectomy.

EXAM:
LEFT KNEE - 1-2 VIEW

[t knee ap left]
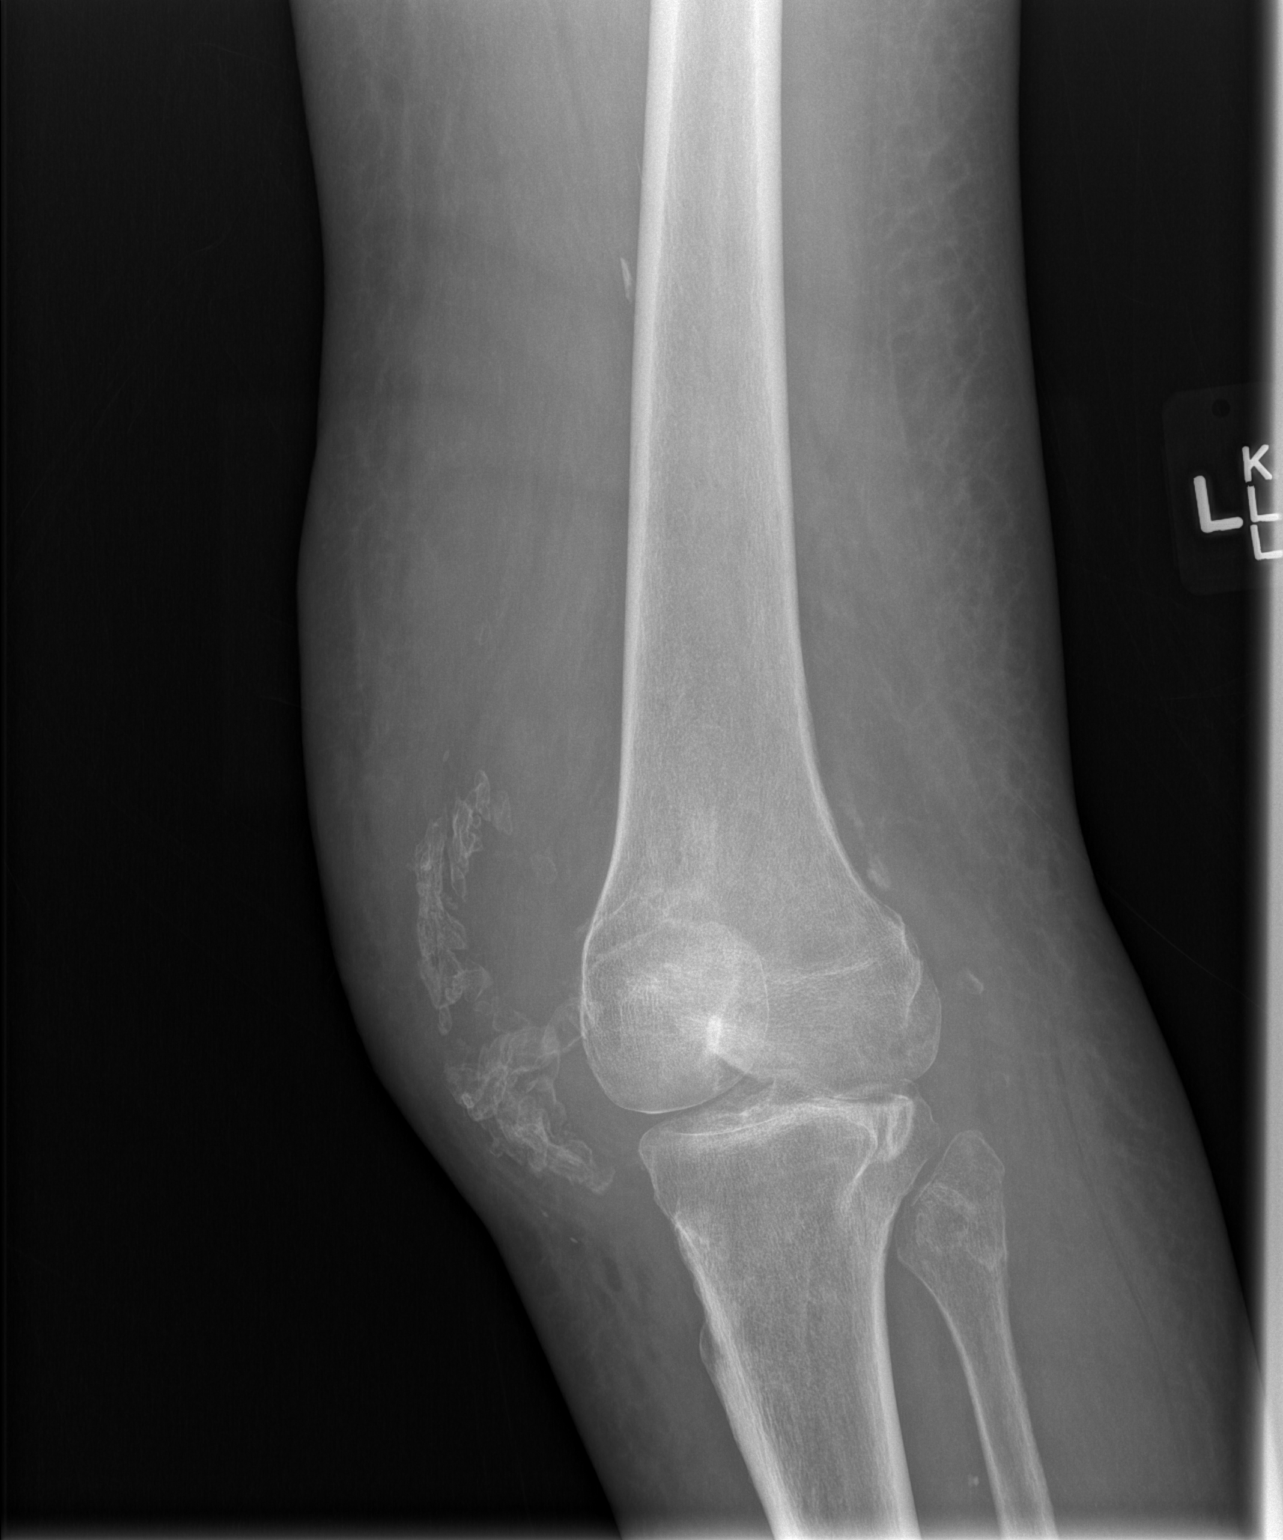

[x knee ap left]
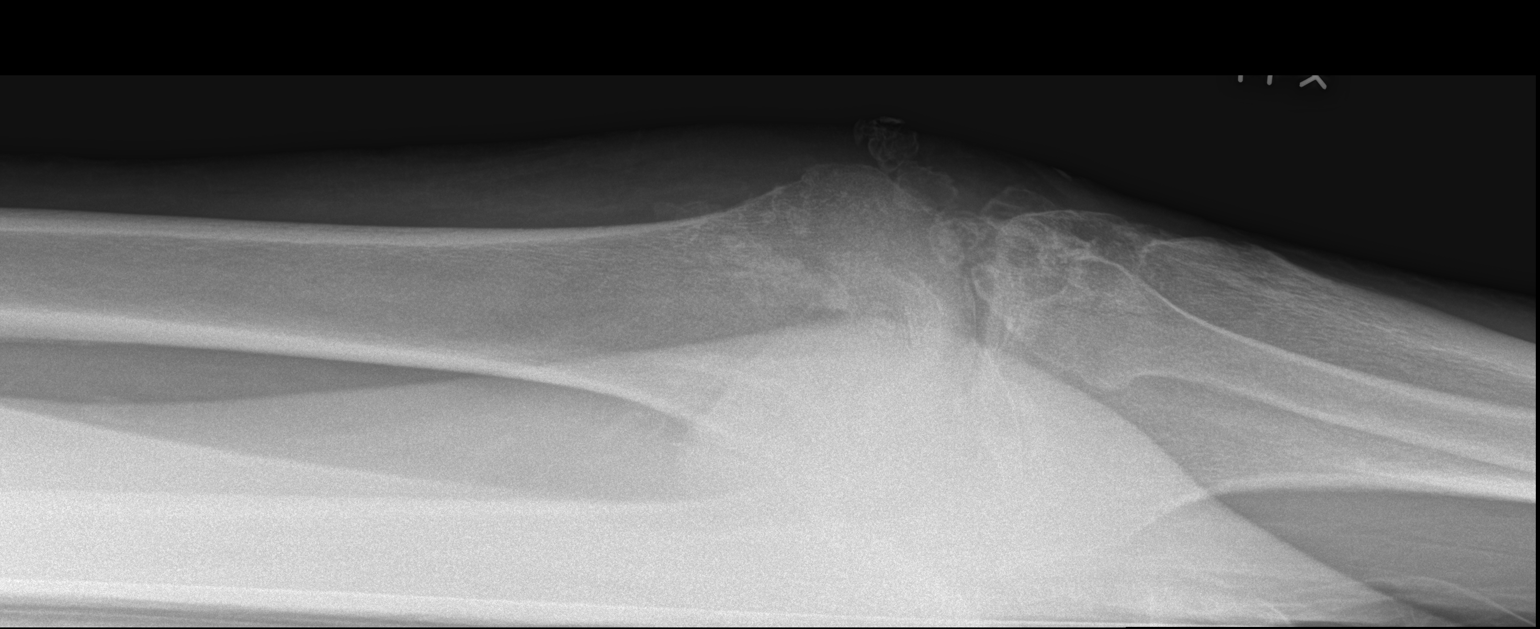

[2 of 2 positions shown; findings below may reference images not displayed]

FINDINGS: Interval removal of the patella. There is extensive swelling about
the knee joint. Limited evaluation for cortical irregularity.
Multiple new ossific densities are demonstrated within the overlying
soft tissues. Markedly limited exam secondary to poor patient
positioning as the patient would not cooperate with the radiographs.
IMPRESSION: Marked soft tissue swelling about the knee concerning for an
infectious process. Overall the exam is markedly limited secondary
to difficulty with patient positioning as the patient did not
cooperate with the examination. Recommend repeat evaluation when
patient is clinically able.

Multiple new calcific densities about the knee joint which for
nonspecific may represent heterotopic calcification versus sequelae
of infection.
